# Patient Record
Sex: Female | Born: 1980
Health system: Southern US, Community
[De-identification: ages and names within clinical notes are randomized; demographics above are authoritative.]

## PROBLEM LIST (undated history)

## (undated) DIAGNOSIS — K219 Gastro-esophageal reflux disease without esophagitis: Secondary | ICD-10-CM

## (undated) DIAGNOSIS — D573 Sickle-cell trait: Secondary | ICD-10-CM

## (undated) DIAGNOSIS — K589 Irritable bowel syndrome without diarrhea: Secondary | ICD-10-CM

## (undated) DIAGNOSIS — R87619 Unspecified abnormal cytological findings in specimens from cervix uteri: Secondary | ICD-10-CM

## (undated) DIAGNOSIS — I10 Essential (primary) hypertension: Secondary | ICD-10-CM

## (undated) DIAGNOSIS — R42 Dizziness and giddiness: Secondary | ICD-10-CM

## (undated) HISTORY — DX: Sickle-cell trait: D57.3

## (undated) HISTORY — DX: Dizziness and giddiness: R42

## (undated) HISTORY — PX: COLPOSCOPY: SHX161

## (undated) HISTORY — DX: Unspecified abnormal cytological findings in specimens from cervix uteri: R87.619

---
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
## 2000-10-19 ENCOUNTER — Other Ambulatory Visit: Admission: RE | Admit: 2000-10-19 | Discharge: 2000-10-19 | Payer: Self-pay | Admitting: Obstetrics & Gynecology

## 2000-12-02 ENCOUNTER — Observation Stay (HOSPITAL_COMMUNITY): Admission: AD | Admit: 2000-12-02 | Discharge: 2000-12-03 | Payer: Self-pay | Admitting: Obstetrics and Gynecology

## 2000-12-02 ENCOUNTER — Encounter (INDEPENDENT_AMBULATORY_CARE_PROVIDER_SITE_OTHER): Payer: Self-pay

## 2001-05-02 ENCOUNTER — Emergency Department (HOSPITAL_COMMUNITY): Admission: EM | Admit: 2001-05-02 | Discharge: 2001-05-03 | Payer: Self-pay | Admitting: Emergency Medicine

## 2002-01-30 ENCOUNTER — Other Ambulatory Visit: Admission: RE | Admit: 2002-01-30 | Discharge: 2002-01-30 | Payer: Self-pay | Admitting: Obstetrics and Gynecology

## 2002-04-15 ENCOUNTER — Encounter (INDEPENDENT_AMBULATORY_CARE_PROVIDER_SITE_OTHER): Payer: Self-pay | Admitting: Specialist

## 2002-04-15 ENCOUNTER — Observation Stay (HOSPITAL_COMMUNITY): Admission: AD | Admit: 2002-04-15 | Discharge: 2002-04-16 | Payer: Self-pay | Admitting: Obstetrics and Gynecology

## 2004-01-04 ENCOUNTER — Emergency Department (HOSPITAL_COMMUNITY): Admission: EM | Admit: 2004-01-04 | Discharge: 2004-01-04 | Payer: Self-pay | Admitting: Emergency Medicine

## 2005-04-20 ENCOUNTER — Emergency Department (HOSPITAL_COMMUNITY): Admission: EM | Admit: 2005-04-20 | Discharge: 2005-04-20 | Payer: Self-pay | Admitting: Emergency Medicine

## 2005-08-07 ENCOUNTER — Ambulatory Visit: Payer: Self-pay | Admitting: Internal Medicine

## 2006-03-06 ENCOUNTER — Emergency Department (HOSPITAL_COMMUNITY): Admission: EM | Admit: 2006-03-06 | Discharge: 2006-03-06 | Payer: Self-pay | Admitting: Emergency Medicine

## 2007-08-13 ENCOUNTER — Emergency Department (HOSPITAL_COMMUNITY): Admission: EM | Admit: 2007-08-13 | Discharge: 2007-08-13 | Payer: Self-pay | Admitting: Emergency Medicine

## 2009-01-01 ENCOUNTER — Emergency Department (HOSPITAL_COMMUNITY): Admission: EM | Admit: 2009-01-01 | Discharge: 2009-01-01 | Payer: Self-pay | Admitting: Emergency Medicine

## 2009-03-22 ENCOUNTER — Inpatient Hospital Stay (HOSPITAL_COMMUNITY): Admission: AD | Admit: 2009-03-22 | Discharge: 2009-03-22 | Payer: Self-pay | Admitting: Obstetrics & Gynecology

## 2009-05-18 ENCOUNTER — Inpatient Hospital Stay (HOSPITAL_COMMUNITY): Admission: AD | Admit: 2009-05-18 | Discharge: 2009-05-18 | Payer: Self-pay | Admitting: Obstetrics & Gynecology

## 2009-05-18 ENCOUNTER — Ambulatory Visit: Payer: Self-pay | Admitting: Physician Assistant

## 2009-06-03 ENCOUNTER — Ambulatory Visit: Payer: Self-pay | Admitting: Obstetrics & Gynecology

## 2009-06-08 ENCOUNTER — Inpatient Hospital Stay (HOSPITAL_COMMUNITY): Admission: AD | Admit: 2009-06-08 | Discharge: 2009-06-08 | Payer: Self-pay | Admitting: Obstetrics & Gynecology

## 2009-06-12 ENCOUNTER — Inpatient Hospital Stay (HOSPITAL_COMMUNITY): Admission: AD | Admit: 2009-06-12 | Discharge: 2009-06-12 | Payer: Self-pay | Admitting: Obstetrics & Gynecology

## 2009-06-16 ENCOUNTER — Inpatient Hospital Stay (HOSPITAL_COMMUNITY): Admission: AD | Admit: 2009-06-16 | Discharge: 2009-06-16 | Payer: Self-pay | Admitting: Obstetrics & Gynecology

## 2009-06-24 ENCOUNTER — Ambulatory Visit: Payer: Self-pay | Admitting: Obstetrics & Gynecology

## 2009-06-28 ENCOUNTER — Ambulatory Visit: Payer: Self-pay | Admitting: Obstetrics & Gynecology

## 2009-06-28 ENCOUNTER — Ambulatory Visit (HOSPITAL_COMMUNITY): Admission: RE | Admit: 2009-06-28 | Discharge: 2009-06-28 | Payer: Self-pay | Admitting: Obstetrics & Gynecology

## 2009-06-29 ENCOUNTER — Inpatient Hospital Stay (HOSPITAL_COMMUNITY): Admission: AD | Admit: 2009-06-29 | Discharge: 2009-06-29 | Payer: Self-pay | Admitting: Obstetrics & Gynecology

## 2009-07-06 ENCOUNTER — Ambulatory Visit (HOSPITAL_COMMUNITY): Admission: RE | Admit: 2009-07-06 | Discharge: 2009-07-06 | Payer: Self-pay | Admitting: Obstetrics & Gynecology

## 2009-07-08 ENCOUNTER — Ambulatory Visit: Payer: Self-pay | Admitting: Obstetrics & Gynecology

## 2009-07-17 ENCOUNTER — Inpatient Hospital Stay (HOSPITAL_COMMUNITY): Admission: AD | Admit: 2009-07-17 | Discharge: 2009-07-17 | Payer: Self-pay | Admitting: Obstetrics & Gynecology

## 2009-07-19 ENCOUNTER — Ambulatory Visit: Payer: Self-pay | Admitting: Obstetrics & Gynecology

## 2009-07-19 ENCOUNTER — Inpatient Hospital Stay (HOSPITAL_COMMUNITY): Admission: AD | Admit: 2009-07-19 | Discharge: 2009-07-22 | Payer: Self-pay | Admitting: Obstetrics and Gynecology

## 2009-07-21 ENCOUNTER — Encounter: Payer: Self-pay | Admitting: Obstetrics and Gynecology

## 2009-07-25 ENCOUNTER — Inpatient Hospital Stay (HOSPITAL_COMMUNITY): Admission: AD | Admit: 2009-07-25 | Discharge: 2009-07-25 | Payer: Self-pay | Admitting: Obstetrics & Gynecology

## 2009-07-29 ENCOUNTER — Ambulatory Visit (HOSPITAL_COMMUNITY): Admission: RE | Admit: 2009-07-29 | Discharge: 2009-07-29 | Payer: Self-pay | Admitting: Obstetrics & Gynecology

## 2009-07-29 ENCOUNTER — Ambulatory Visit: Payer: Self-pay | Admitting: Obstetrics & Gynecology

## 2009-08-04 ENCOUNTER — Ambulatory Visit (HOSPITAL_COMMUNITY): Admission: RE | Admit: 2009-08-04 | Discharge: 2009-08-04 | Payer: Self-pay | Admitting: Obstetrics & Gynecology

## 2009-08-05 ENCOUNTER — Ambulatory Visit: Payer: Self-pay | Admitting: Obstetrics & Gynecology

## 2009-08-12 ENCOUNTER — Ambulatory Visit (HOSPITAL_COMMUNITY): Admission: RE | Admit: 2009-08-12 | Discharge: 2009-08-12 | Payer: Self-pay | Admitting: Obstetrics & Gynecology

## 2009-08-12 ENCOUNTER — Ambulatory Visit: Payer: Self-pay | Admitting: Obstetrics and Gynecology

## 2009-08-13 ENCOUNTER — Encounter: Payer: Self-pay | Admitting: Obstetrics & Gynecology

## 2009-08-13 ENCOUNTER — Inpatient Hospital Stay (HOSPITAL_COMMUNITY): Admission: AD | Admit: 2009-08-13 | Discharge: 2009-08-13 | Payer: Self-pay | Admitting: Obstetrics & Gynecology

## 2009-08-18 ENCOUNTER — Inpatient Hospital Stay (HOSPITAL_COMMUNITY): Admission: AD | Admit: 2009-08-18 | Discharge: 2009-08-18 | Payer: Self-pay | Admitting: Family Medicine

## 2009-08-19 ENCOUNTER — Ambulatory Visit (HOSPITAL_COMMUNITY): Admission: RE | Admit: 2009-08-19 | Discharge: 2009-08-19 | Payer: Self-pay | Admitting: Obstetrics & Gynecology

## 2009-08-19 ENCOUNTER — Ambulatory Visit: Payer: Self-pay | Admitting: Obstetrics & Gynecology

## 2009-08-24 ENCOUNTER — Ambulatory Visit: Payer: Self-pay | Admitting: Family

## 2009-08-24 ENCOUNTER — Inpatient Hospital Stay (HOSPITAL_COMMUNITY): Admission: AD | Admit: 2009-08-24 | Discharge: 2009-08-26 | Payer: Self-pay | Admitting: Obstetrics & Gynecology

## 2009-09-17 ENCOUNTER — Ambulatory Visit: Payer: Self-pay | Admitting: Obstetrics & Gynecology

## 2010-04-14 IMAGING — US US OB COMP LESS 14 WK
1 series · 14 of 23 positions shown · non-contrast
Comparison: 05/18/2009

CLINICAL DATA: Early pregnancy.  Abnormal uterine bleeding.

OBSTETRIC <14 WK ULTRASOUND
TECHNIQUE: Transabdominal ultrasound was performed for evaluation
of the gestation as well as the maternal uterus and adnexal
regions.

[Series 1: us ob comp less 14 wks · 0.17mm/px · 14 of 23 slices shown]
[im 1/23]
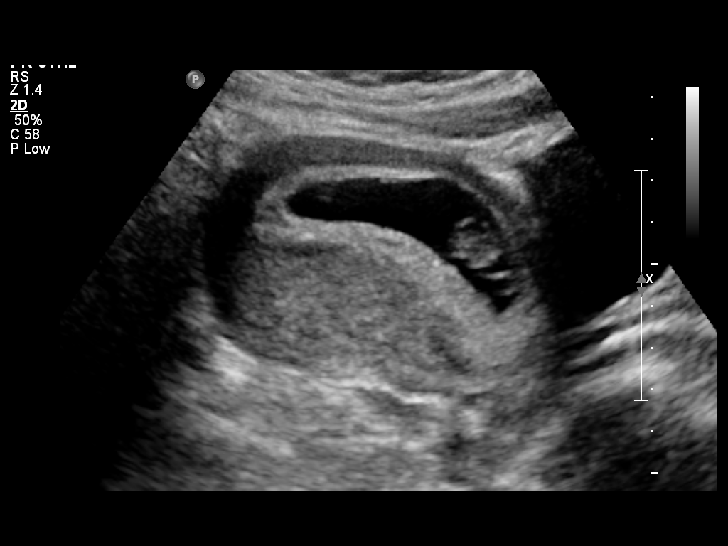
[im 3/23]
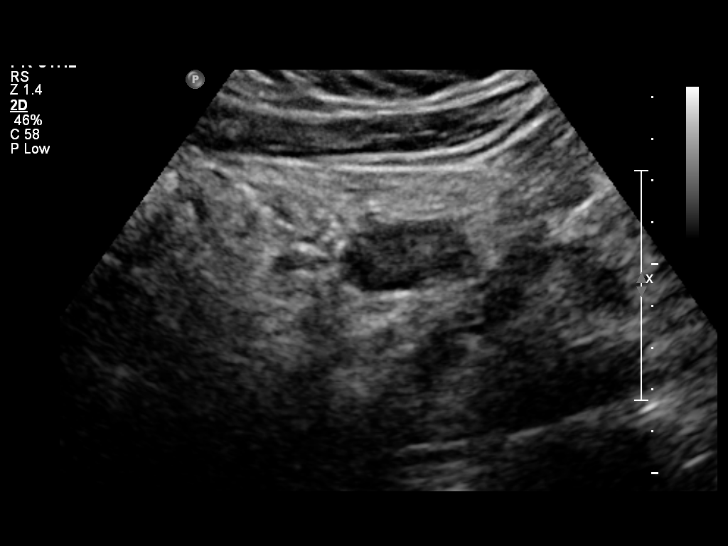
[im 5/23]
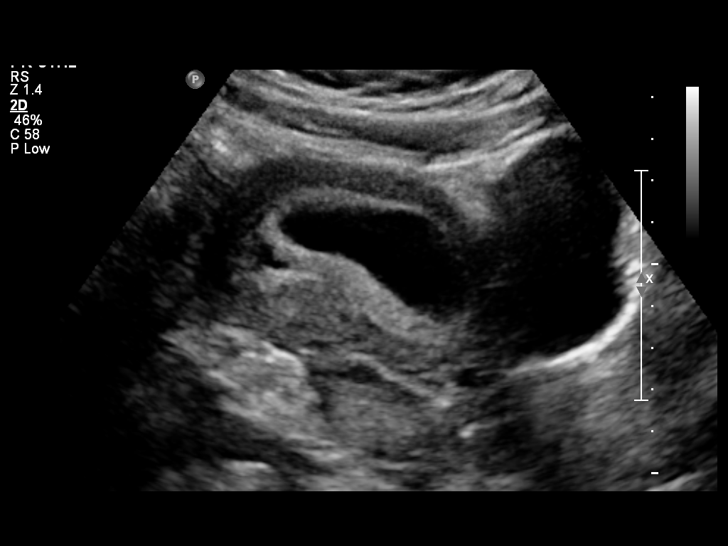
[im 6/23]
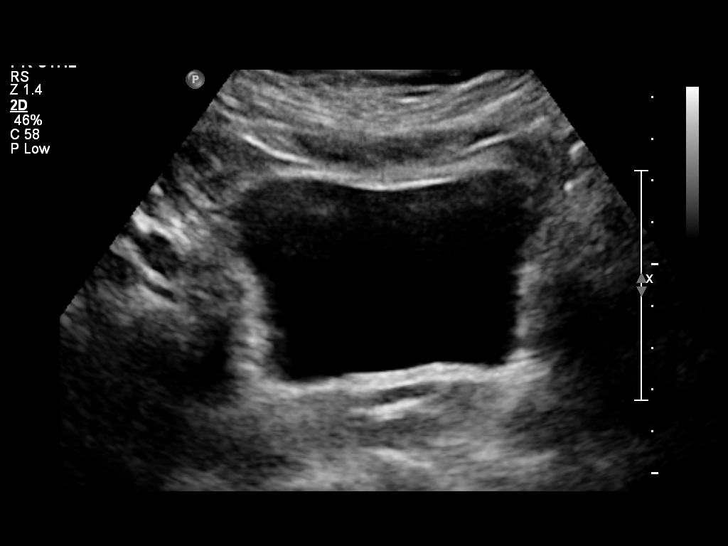
[im 8/23]
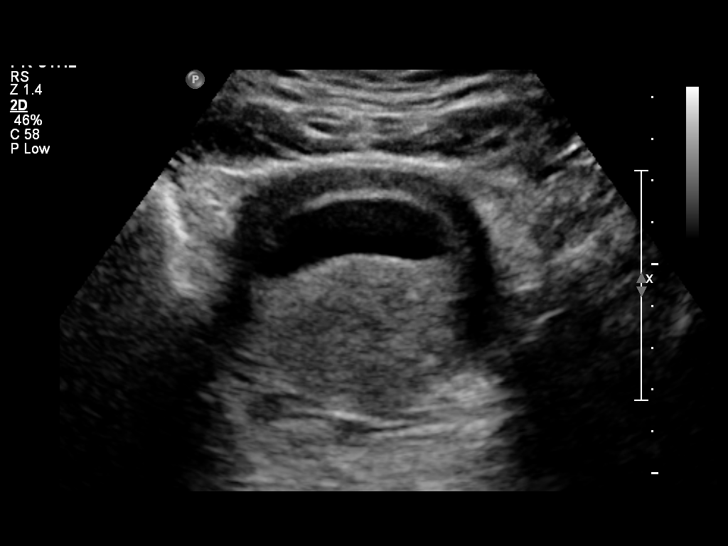
[im 10/23]
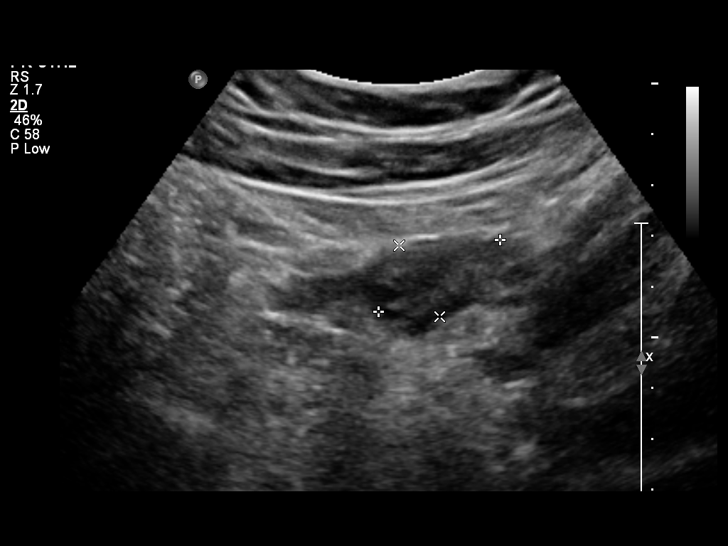
[im 11/23]
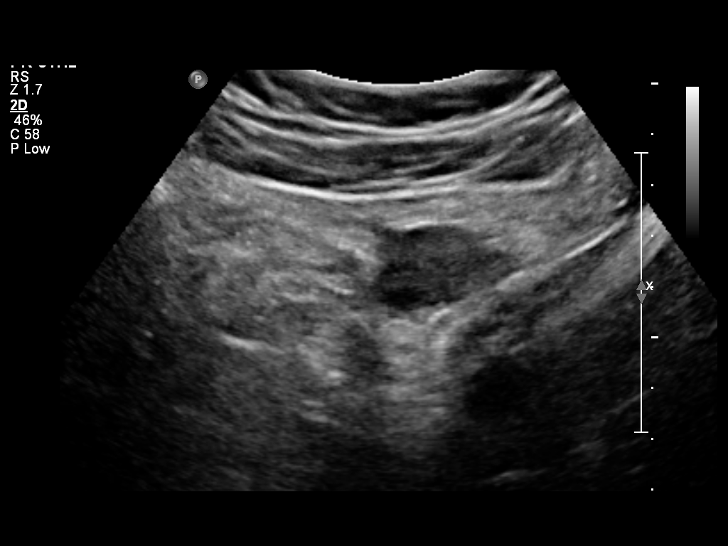
[im 13/23]
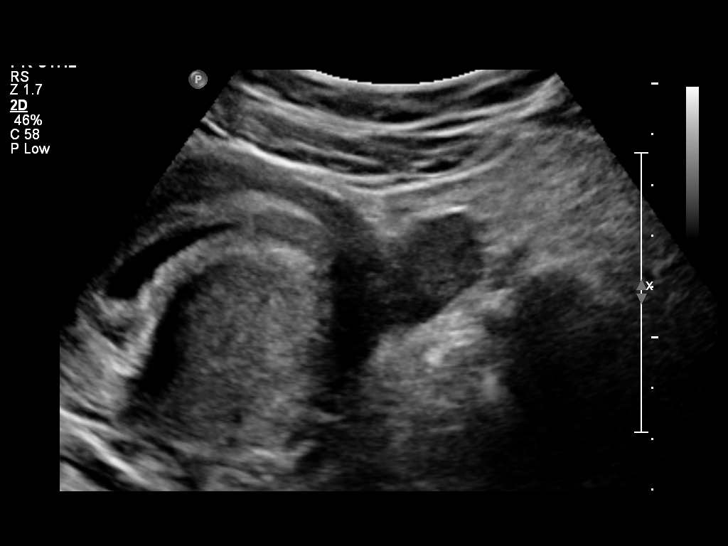
[im 14/23]
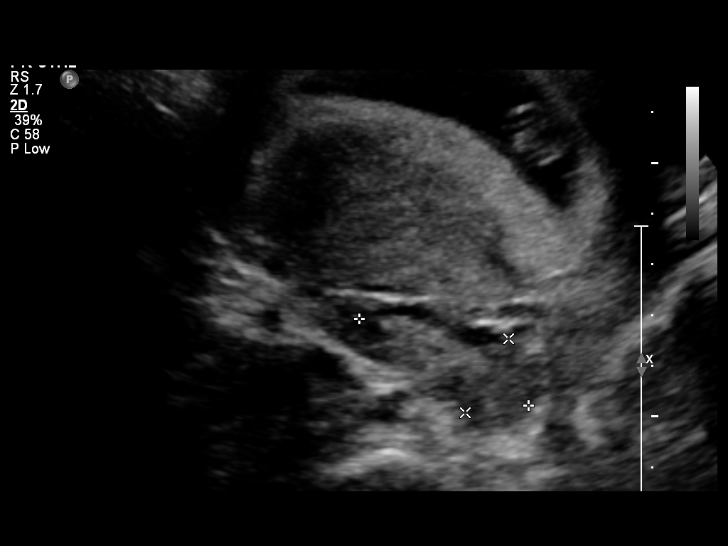
[im 16/23]
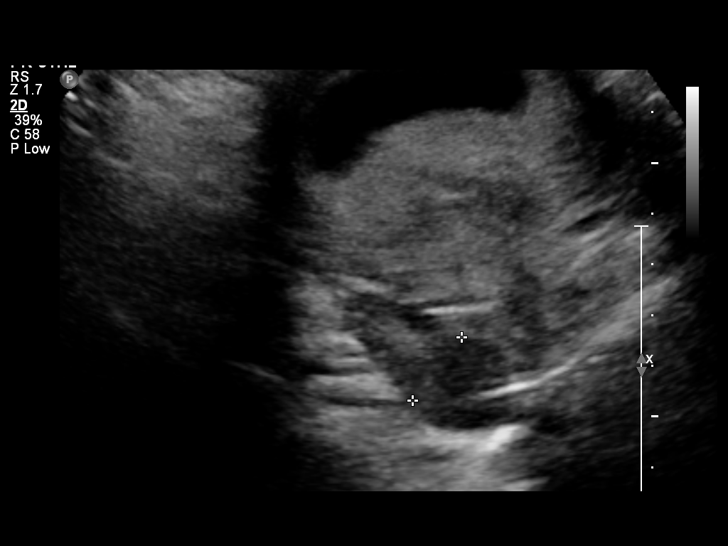
[im 18/23]
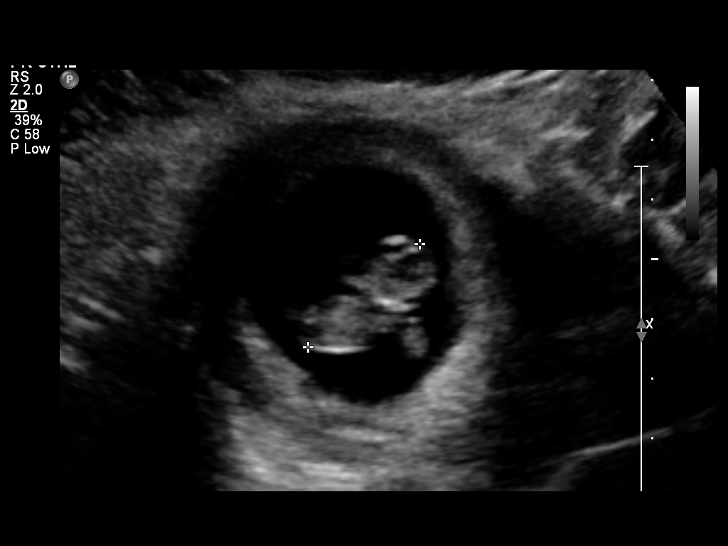
[im 19/23]
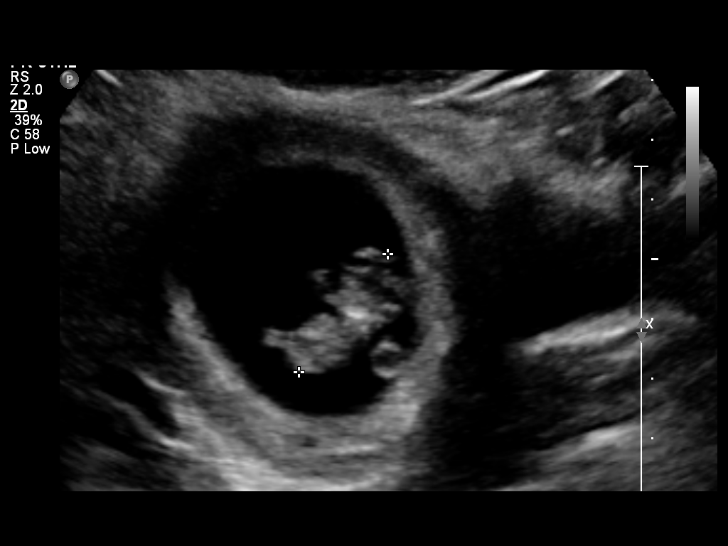
[im 21/23]
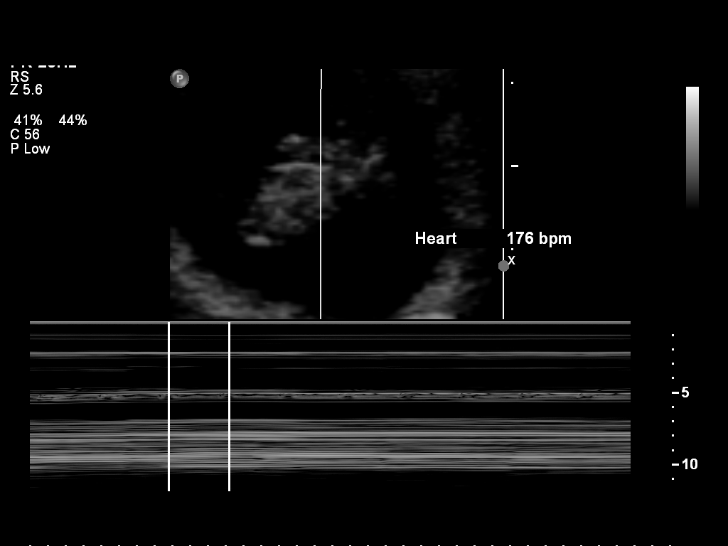
[im 23/23]
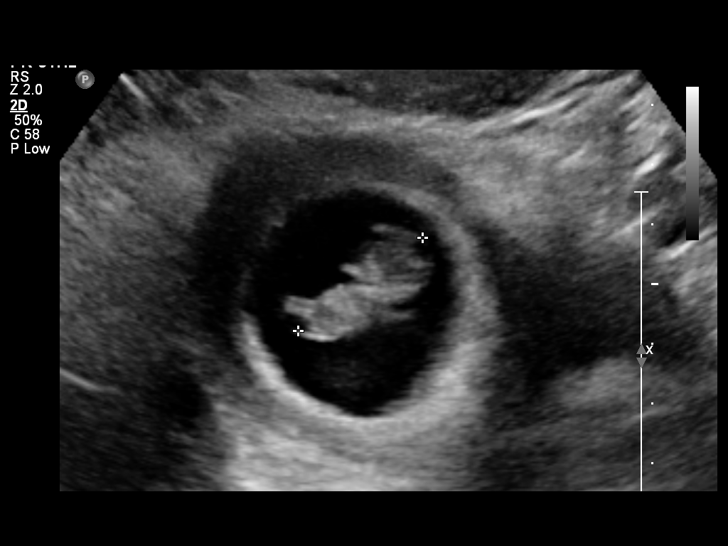

[14 of 23 positions shown; findings below may reference images not displayed]

Intrauterine gestational sac: Single
Yolk sac: Yes
Embryo: Yes
Cardiac Activity: Yes
Heart Rate: 176 bpm

CRL:  25.3 mm         9w  3d        US EDC: 01/08/2010

Maternal uterus/Adnexae:
There is no subchorionic hemorrhage.  The ovaries are normal.  No
free fluid.
IMPRESSION: Normal appearing single intrauterine pregnancy of approximately 9
weeks 3 days gestation, showing normal growth in the interval since
the prior study dated 05/18/2009.

## 2010-06-10 IMAGING — US US OB TRANSVAGINAL
1 series · 11 of 11 positions shown · non-contrast
Comparison: none

OBSTETRICAL ULTRASOUND:
 This ultrasound was performed in The [HOSPITAL], and the [facility] report will be stored to [REDACTED] PACS.

[Series 1: us ob transvaginal · 11 acquisitions, 11 frames shown]
[im 1/11]
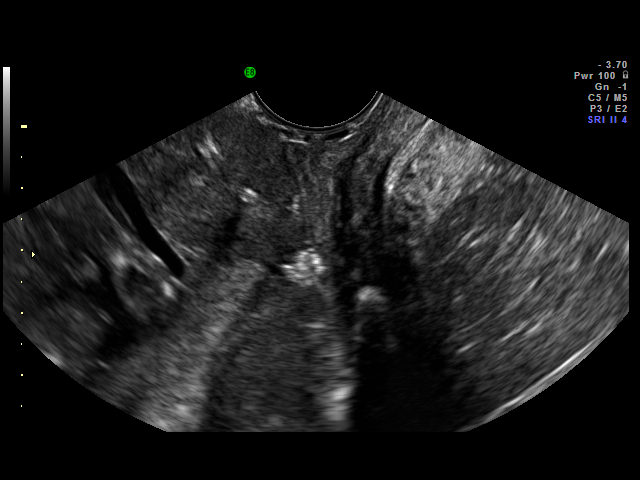
[im 2/11]
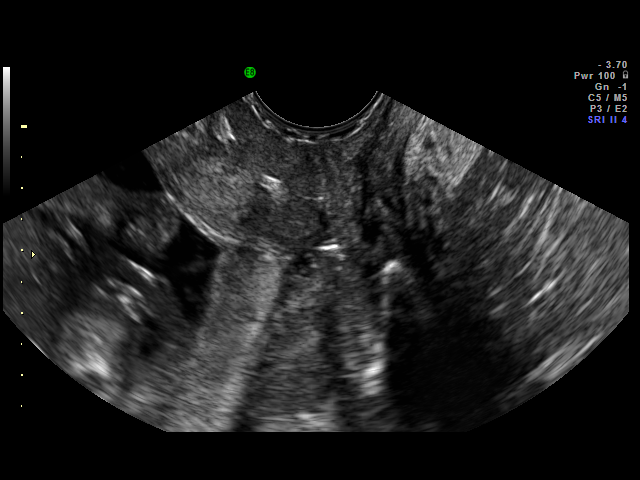
[im 3/11]
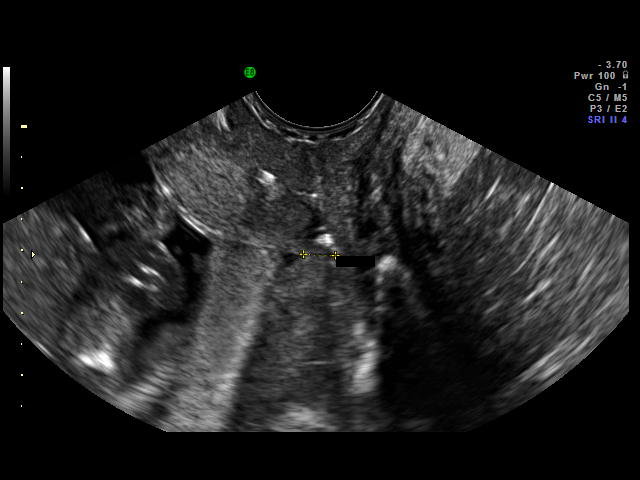
[im 4/11]
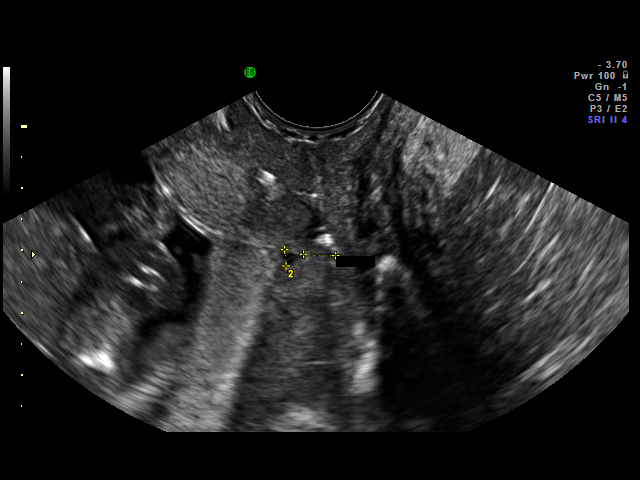
[im 5/11]
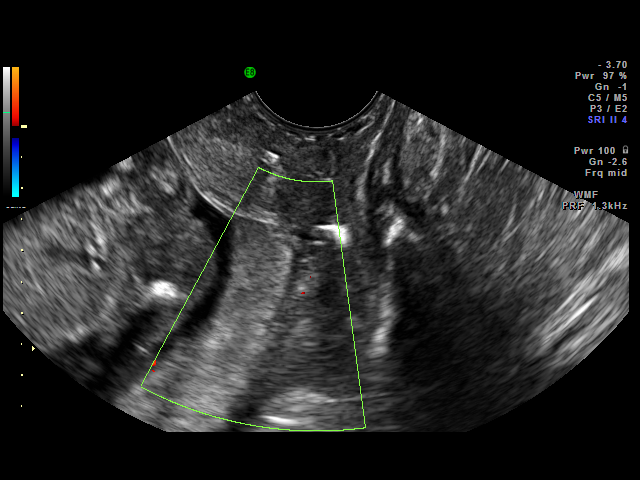
[im 6/11]
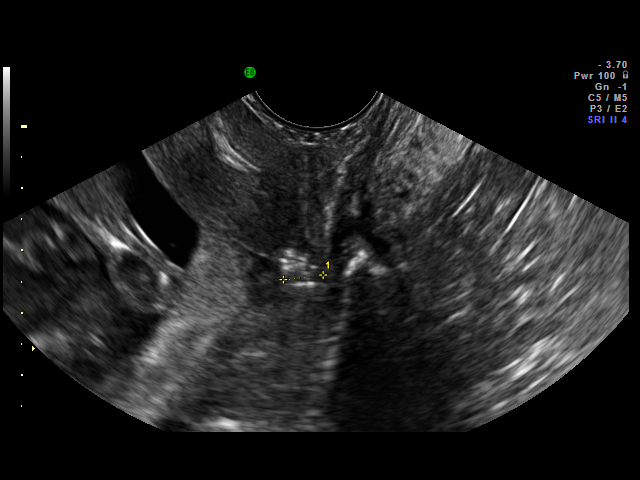
[im 7/11]
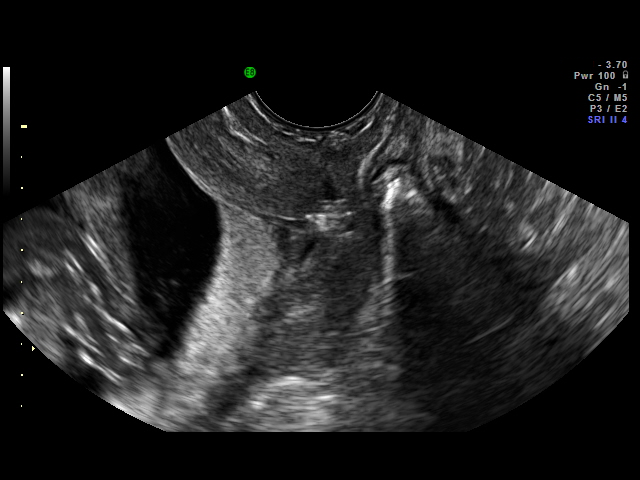
[im 8/11]
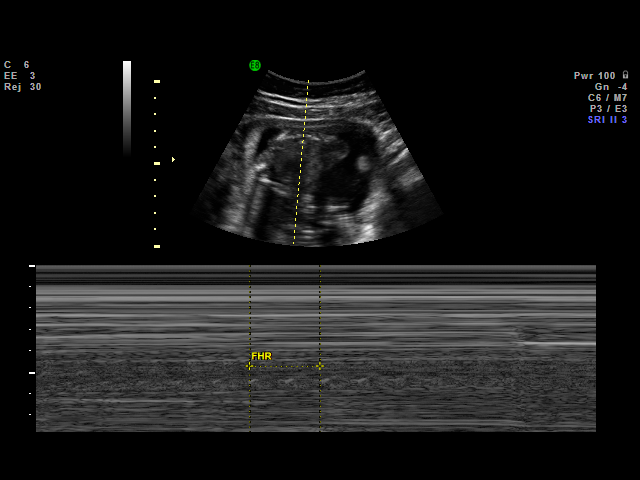
[im 9/11]
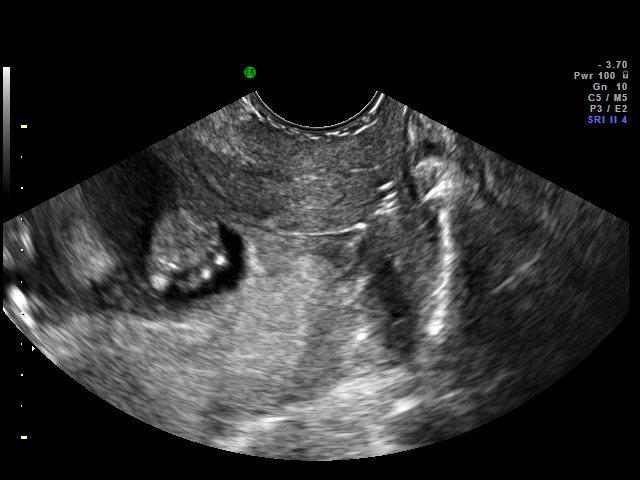
[im 10/11]
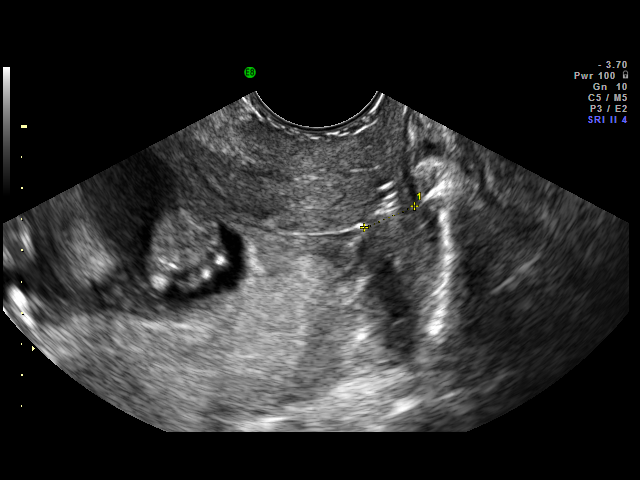
[im 11/11]
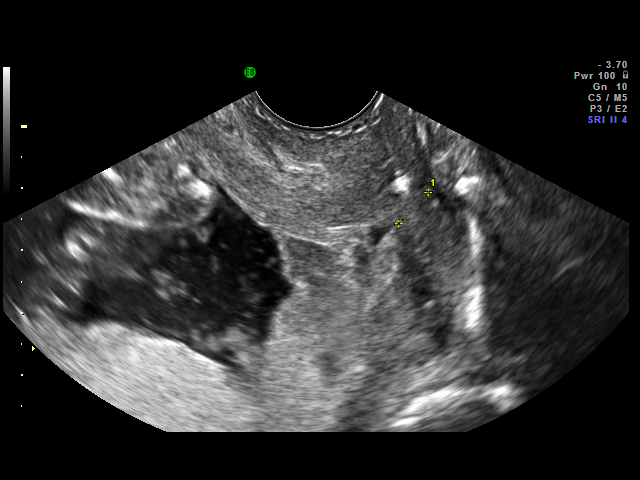

[11 of 11 positions shown; findings below may reference images not displayed]

IMPRESSION: [facility] has also been faxed to the ordering physician.

## 2011-01-02 ENCOUNTER — Encounter: Payer: Self-pay | Admitting: *Deleted

## 2011-02-11 ENCOUNTER — Inpatient Hospital Stay (HOSPITAL_COMMUNITY)
Admission: AD | Admit: 2011-02-11 | Discharge: 2011-02-11 | Disposition: A | Discharge status: Home / Self Care | Payer: 59 | Source: Ambulatory Visit | Attending: Family Medicine | Admitting: Family Medicine

## 2011-02-11 DIAGNOSIS — R300 Dysuria: Secondary | ICD-10-CM

## 2011-02-11 DIAGNOSIS — N390 Urinary tract infection, site not specified: Secondary | ICD-10-CM

## 2011-02-11 LAB — URINALYSIS, ROUTINE W REFLEX MICROSCOPIC
Bilirubin Urine: NEGATIVE
Ketones, ur: NEGATIVE mg/dL
Nitrite: POSITIVE — AB
Protein, ur: NEGATIVE mg/dL
Specific Gravity, Urine: >1.03 — ABNORMAL HIGH (ref 1.005–1.030)

## 2011-02-11 LAB — URINE MICROSCOPIC-ADD ON

## 2011-03-17 LAB — CBC
HCT: 36.7 % (ref 36.0–46.0)
HCT: 39.6 % (ref 36.0–46.0)
Hemoglobin: 12.6 g/dL (ref 12.0–15.0)
Hemoglobin: 13.3 g/dL (ref 12.0–15.0)
MCHC: 33.7 g/dL (ref 30.0–36.0)
MCV: 85.6 fL (ref 78.0–100.0)
RBC: 4.32 MIL/uL (ref 3.87–5.11)
RBC: 4.63 MIL/uL (ref 3.87–5.11)
RDW: 12.7 % (ref 11.5–15.5)
WBC: 14.6 10*3/uL — ABNORMAL HIGH (ref 4.0–10.5)

## 2011-03-17 LAB — CROSSMATCH

## 2011-03-17 LAB — POCT URINALYSIS DIP (DEVICE)
Ketones, ur: NEGATIVE mg/dL
Protein, ur: NEGATIVE mg/dL
pH: 7 (ref 5.0–8.0)

## 2011-03-17 LAB — WET PREP, GENITAL

## 2011-03-18 LAB — URINALYSIS, ROUTINE W REFLEX MICROSCOPIC
Leukocytes, UA: NEGATIVE
Nitrite: NEGATIVE
Specific Gravity, Urine: 1.01 (ref 1.005–1.030)
pH: 7 (ref 5.0–8.0)

## 2011-03-18 LAB — CBC
HCT: 37.9 % (ref 36.0–46.0)
MCHC: 34.1 g/dL (ref 30.0–36.0)
Platelets: 267 10*3/uL (ref 150–400)
Platelets: 274 10*3/uL (ref 150–400)
RBC: 4.36 MIL/uL (ref 3.87–5.11)
RDW: 13.1 % (ref 11.5–15.5)
WBC: 12.6 10*3/uL — ABNORMAL HIGH (ref 4.0–10.5)

## 2011-03-18 LAB — ABO/RH: ABO/RH(D): O POS

## 2011-03-18 LAB — POCT URINALYSIS DIP (DEVICE)
Glucose, UA: NEGATIVE mg/dL
Nitrite: NEGATIVE
Protein, ur: NEGATIVE mg/dL
Protein, ur: NEGATIVE mg/dL
Specific Gravity, Urine: 1.015 (ref 1.005–1.030)
Urobilinogen, UA: 1 mg/dL (ref 0.0–1.0)
Urobilinogen, UA: 2 mg/dL — ABNORMAL HIGH (ref 0.0–1.0)

## 2011-03-18 LAB — URINE MICROSCOPIC-ADD ON

## 2011-03-19 LAB — POCT URINALYSIS DIP (DEVICE)
Bilirubin Urine: NEGATIVE
Bilirubin Urine: NEGATIVE
Glucose, UA: NEGATIVE mg/dL
Glucose, UA: NEGATIVE mg/dL
Hgb urine dipstick: NEGATIVE
Nitrite: NEGATIVE
Nitrite: NEGATIVE
Specific Gravity, Urine: 1.02 (ref 1.005–1.030)
Urobilinogen, UA: 1 mg/dL (ref 0.0–1.0)
Urobilinogen, UA: 2 mg/dL — ABNORMAL HIGH (ref 0.0–1.0)
pH: 6.5 (ref 5.0–8.0)

## 2011-03-19 LAB — CBC
MCHC: 34.9 g/dL (ref 30.0–36.0)
MCV: 83.6 fL (ref 78.0–100.0)
Platelets: 286 10*3/uL (ref 150–400)
RDW: 13.2 % (ref 11.5–15.5)

## 2011-03-19 LAB — WET PREP, GENITAL: Trich, Wet Prep: NONE SEEN

## 2011-03-19 LAB — URINALYSIS, ROUTINE W REFLEX MICROSCOPIC
Nitrite: NEGATIVE
Protein, ur: NEGATIVE mg/dL
Specific Gravity, Urine: 1.01 (ref 1.005–1.030)
Urobilinogen, UA: 1 mg/dL (ref 0.0–1.0)

## 2011-03-20 LAB — WET PREP, GENITAL
Trich, Wet Prep: NONE SEEN
Yeast Wet Prep HPF POC: NONE SEEN

## 2011-03-20 LAB — URINALYSIS, ROUTINE W REFLEX MICROSCOPIC
Bilirubin Urine: NEGATIVE
Bilirubin Urine: NEGATIVE
Glucose, UA: 100 mg/dL — AB
Ketones, ur: 15 mg/dL — AB
Nitrite: NEGATIVE
Protein, ur: NEGATIVE mg/dL
Specific Gravity, Urine: 1.025 (ref 1.005–1.030)
Urobilinogen, UA: 0.2 mg/dL (ref 0.0–1.0)
Urobilinogen, UA: 0.2 mg/dL (ref 0.0–1.0)
pH: 5.5 (ref 5.0–8.0)

## 2011-03-20 LAB — CBC
HCT: 41.7 % (ref 36.0–46.0)
MCHC: 34.6 g/dL (ref 30.0–36.0)
MCV: 85.1 fL (ref 78.0–100.0)
Platelets: 281 10*3/uL (ref 150–400)
RDW: 13.7 % (ref 11.5–15.5)

## 2011-03-20 LAB — URINE MICROSCOPIC-ADD ON

## 2011-03-20 LAB — HCG, QUANTITATIVE, PREGNANCY: hCG, Beta Chain, Quant, S: 55087 m[IU]/mL — ABNORMAL HIGH (ref <5)

## 2011-03-20 LAB — GC/CHLAMYDIA PROBE AMP, URINE: Chlamydia, Swab/Urine, PCR: NEGATIVE

## 2011-03-22 LAB — URINE CULTURE

## 2011-03-22 LAB — WET PREP, GENITAL

## 2011-03-22 LAB — URINALYSIS, ROUTINE W REFLEX MICROSCOPIC
Nitrite: NEGATIVE
Protein, ur: NEGATIVE mg/dL
Urobilinogen, UA: 0.2 mg/dL (ref 0.0–1.0)

## 2011-03-22 LAB — URINE MICROSCOPIC-ADD ON

## 2011-03-22 LAB — GC/CHLAMYDIA PROBE AMP, GENITAL: Chlamydia, DNA Probe: NEGATIVE

## 2011-03-23 LAB — POCT PREGNANCY, URINE: Preg Test, Ur: NEGATIVE

## 2011-04-25 NOTE — Discharge Summary (Signed)
NAMEJENNILEE, [name]         ACCOUNT NO.:  [id number]   MEDICAL RECORD NO.:  [id number]          PATIENT TYPE:  INP   LOCATION:  9320                          FACILITY:  [facility]   PHYSICIAN:  Horton Chin, MD DATE OF BIRTH:  27-Aug-1981   DATE OF ADMISSION:  07/18/2009  DATE OF DISCHARGE:  07/22/2009                               DISCHARGE SUMMARY   ADMISSION DIAGNOSES:  1. Intrauterine pregnancy at 28 and 4/7th weeks' gestation.  2. Bleeding per vagina.  3. Posterior placenta previa.  4. Cervical length 1.07 cm which is indicative of cervical      insufficiency.  The patient is status post prophylactic cerclage.   DISCHARGE DIAGNOSES:  1. Intrauterine pregnancy at 2 and 2 weeks' gestation.  2. Resolved vaginal bleeding.   PERTINENT LABORATORY DATA:  Admission hemoglobin is 12.8 which remained  stable.   CONSULTS:  Maternal fetal medicine, Dr. Rica Koyanagi.   BRIEF HOSPITAL COURSE:  The patient is a 30 year old, gravida 3, para 0-  1-1-0, who was admitted at 77 and 5/7th weeks' with vaginal bleeding.  The patient does have a history of 2 second trimester losses at 53 and  22 weeks, and had a prophylactic transvaginal cerclage procedure done  around 12 weeks' gestation.  The patient was admitted in the setting of  a significant amount of vaginal bleeding, and ultrasound that was done  on admission showed a posterior placenta previa and a clot that was  noted to distend the lower uterine segment causing funneling with a  cervical length of 1.07 cm.  The cervical canal was noted to be dilated  to about 4-mm with a cervical suture seen within the residual cervix,  however, it was displaced posteriorly.  The patient was admitted for  observation.  During the time of observation, the patient had some  bleeding initially, but tapered off to just brown discharge.  By the  time of discharge, she had not had any vaginal bleeding for over 48  hours and has stable vital signs and  no other concerns.  The patient was  seen by the maternal fetal medicine consult and the plan was made for a  close follow up after given that the patient is stable.  They  recommended against replacement of the cerclage given increased risk of  bleeding and infection.  The decision was made to discharge to home on  bedrest.  The patient was given a note for work that will keep her out  of work for now.  She does understand that she is at increased risk of  further bleeding and infection in this setting and her prognosis at this  point is uncertain.   DISCHARGE MEDICATIONS:  1. Percocet 5/325 mg 1-2 tablets p.o. q.6 h p.r.n. pain.  2. Colace 100 mg p.o. t.i.d. p.r.n. constipation.  3. The patient is to continue her prenatal vitamins at home.   DISCHARGE INSTRUCTIONS:  The patient was told to be on strict bedrest  and to also be on pelvic rest.  She was told to come back for any  further bleeding or any other concerns.  The patient has  followup  [facility] July [facility], 2009 at 10:45 a.m. and she has another  appointment with Maternal Fetal Medicine for a followup ultrasound on  the same day July 29, 2009 at 1:00 p.m.  These appointments were  communicated to the patient.      Horton Chin, MD  Electronically Signed     UAA/MEDQ  D:  07/22/2009  T:  07/22/2009  Job:  [id number]

## 2011-04-25 NOTE — Op Note (Signed)
[name], [name]         ACCOUNT NO.:  [id number]   MEDICAL RECORD NO.:  [id number]          PATIENT TYPE:  AMB   LOCATION:  SDC                           [facility]ILITY:  [facility]   PHYSICIAN:  Horton Chin, MD DATE OF BIRTH:  1981-02-26   DATE OF PROCEDURE:  06/28/2009  DATE OF DISCHARGE:                               OPERATIVE REPORT   PREOPERATIVE DIAGNOSIS:  Cervical incompetence.   POSTOPERATIVE DIAGNOSIS:  Cervical incompetence.   PROCEDURE:  McDonald cerclage placement.   SURGEON:  Horton Chin, MD   ANESTHESIOLOGIST:  Raul Del, MD   ANESTHESIA:  Spinal.   INDICATIONS:  The patient is a 30 year old, gravida 3, para 0-1-1-0 with  a history of 2 second trimester losses, who was counseled regarding  prophylactic cervical cerclage.  Prior to surgery, the risks of the  cerclage placement including, but not limited to bleeding, infection,  injury to surrounding organs, premature rupture of membranes, need for  additional procedures were discussed with the patient and written  informed consent was obtained.  The patient had a positive fetal heart  rate that was reassuring and both prior to and after the procedure.   FINDINGS:  A 2 cm of cervical length within the vagina was able to be  palpated.  In the beginning of the case, external os was palpated to be  fingertip and this was effectively closed at the end of cerclage  placement.   SPECIMENS:  None.   IV FLUIDS:  500 mL lactated Ringer's.   ESTIMATED BLOOD LOSS:  Minimal.   COMPLICATIONS:  None immediate.   PROCEDURE DETAILS:  The patient had sequential compression devices  applied to her lower extremities while in the preoperative area and she  was taken to the operating room, where spinal analgesia was administered  and found to be adequate.  The patient was then placed in the dorsal  lithotomy position and prepped and draped in the sterile manner.  Bladder was straight catheterized for an  unmeasured amount of clear  yellow urine.  After a timeout was conducted, attention was then turned  to the patient's pelvis, where a vaginal speculum was placed and a ring  forceps was used to grab the anterior lip of the cervix.  A paracervical  block was administered using 20 mL of 0.25% Marcaine.  At this point  using a 5-mm Mersilene string, the cerclage was placed starting  anteriorly and going in a pursestring fashion with care given to avoid  the vessels at 9 o'clock and at 3 o'clock positions on the cervix and  also ended anteriorly.  The 2 ends of the suture were tied together  anteriorly and excellent closure of the cervix was noted.  The patient  had minimal blood loss at the end of the case.  She tolerated the  procedure well.  Sponge,  instrument, and needle counts were correct x2.  All instruments were  removed from the patient's pelvis.  She was taken to the recovery room  in stable condition.  The patient will follow up in the [facility]  as scheduled and was given prescriptions for  Percocet and Colace as  needed.      Horton Chin, MD  Electronically Signed     UAA/MEDQ  D:  06/28/2009  T:  06/28/2009  Job:  [id number]

## 2011-04-28 NOTE — Op Note (Signed)
[facility]  Patient:    [name], [name]                  MRN: [id number] Proc. Date: 12/03/00 Adm. Date:  [id number] Attending:  Dierdre Forth Pearline                           Operative Report  PREOPERATIVE DIAGNOSIS:       Status post spontaneous abortion with retained placenta.  POSTOPERATIVE DIAGNOSIS:      Status post spontaneous abortion with retained placenta.  OPERATION:                    Dilatation and evacuation.  ANESTHESIA:                   Spinal.  ESTIMATED BLOOD LOSS:         Less than 100 cc.  COMPLICATIONS:                None.  FINDINGS:                     The patient had delivered a still born 16-week fetus approximately four hours prior and had failed to deliver the placenta. At D&E, the placenta was removed primarily intact with a moderate amount of fragments of retained placenta obtained at curettage.  DESCRIPTION OF PROCEDURE:  The patient was taken to the operating room after appropriate identification and placed on the operating table.  After placement of a spinal anesthetic, she was placed in the lithotomy position.  The perineum and vagina were prepped with multiple layers of Betadine. The perineum was draped as a sterile field.  A red Robinson catheter was used to empty the bladder.  A Graves speculum was placed in the vagina and the placenta walked out of the uterus and cervix, then removed from the operative field.  A large curet was then used to curet all quadrants of the uterus and a moderate amount of retained products of conception removed.  Once all products of conception had been removed, the patient was noted to have adequate hemostasis and was taken from the operating room to the recovery room in satisfactory condition, having tolerated the procedure well with all instruments removed from the vagina and counted and sponges correct. Specimens to pathology.  Placenta and fragments of products of  conception. DD:  12/03/00 TD:  12/03/00 Job: [id number]

## 2011-04-28 NOTE — H&P (Signed)
[facility]  Patient:    [name], [name] Visit Number: [id number] MRN: [id number]          Service Type: OBS Location: 9300 [id number] Attending Physician:  Jaymes Graff A Dictated by:   Saverio Danker, C.N.M. Admit Date:  04/15/2002 Discharge Date: 04/16/2002                           History and Physical  HISTORY OF PRESENT ILLNESS:   Ms. Capili is a 30 year old single black female, gravida 2, para 0-0-1-0, at 22-3/7ths weeks by LMP, confirmed by early ultrasound, who presents complaining of sudden onset of uterine cramps around 10 p.m.  She is very uncomfortable with these pains and is requesting something for pain.  She denies any leaking or bleeding.  She denies any drug use recently.  She denies any nausea, vomiting, headaches or visual disturbances.  She is very uncomfortable as previously mentioned.  Her pregnancy has been followed at [facility] by the M.D. service and has been at risk for a history of previous 16-week IUSD in December of 2001.  OBSTETRICAL/GYNECOLOGICAL HISTORY:                      She is a gravida 2, para 0-0-1-0 who delivered a stillbirth at 16-week gestation in December of 2001.  Her last menstrual period for this pregnancy was on November 07, 2001, giving her an [facility] of August 14, 2002.  She reports a history of Depo-Provera use with her last one in January of 2002.  Her other GYN history was a D&E for retained placenta following her pregnancy loss.  She reports a history of Chlamydia with that first pregnancy also.  GENERAL MEDICAL HISTORY:      She has no known drug allergies.  She reports having had the usual childhood diseases.  She has no other medical problems. Her only surgery was wisdom teeth removed and a D&E for retained placenta.  FAMILY HISTORY:               Significant for maternal grandfather with MI, first cousin with a kidney transplant.  GENETIC HISTORY:               Negative.  SOCIAL HISTORY:               She is single.  She lives with her family, her mom.  The father of the baby is Lorin Mercy and is supportive.  She is currently unemployed.  PRENATAL LABORATORIES:        Her prenatal labs are not currently available.  PHYSICAL EXAMINATION:  VITAL SIGNS:                  Her vital signs are stable, she is afebrile.  HEENT:                        Grossly within normal limits.  HEART:                        Regular rhythm and rate.  CHEST:                        Clear.  BREASTS:                      Soft and nontender.  ABDOMEN:  Her abdomen is with fundal height around her umbilicus.  Lower abdomen is tender with contractions, but nontender to palpation.  Her fetal heart rate is not audible with Doppler and is visible in the 70-80 range by portable bedside ultrasound.  PELVIC:                       A sterile speculum exam reveals bulging membranes and her cervix is completely dilated at 100% and at a 0 station.  EXTREMITIES:                  Within normal limits.  IMPRESSION: 1. Intrauterine pregnancy at 22-3/7ths weeks by last menstrual period. 2. Preterm dilation.  PLAN:                         To admit to labor and delivery, to anticipate an imminent delivery of a nonviable fetus.  GC, Chlamydia, beta strep were collected.  Subcu terbutaline has been given and Stadol IV has also been given.  The patient was offered tocolysis but declined at this time.  Dr. Normand Sloop has been notified of patients admission and plan has been made in consultation with her and she will follow patient. Dictated by:   Vance Gather Duplantis, C.N.M. Attending Physician:  Michael Litter DD:  04/16/02 TD:  04/16/02 Job: [id number]

## 2011-04-28 NOTE — H&P (Signed)
[facility]  Patient:    [name], [name]                  MRN: [id number] Adm. Date:  [id number] Attending:  Shaune Spittle Dictator:   Mack Guise, C.N.M.                         History and Physical  NO DICTATION DD:  12/02/00 TD:  12/02/00 Job: [id number]

## 2011-04-28 NOTE — H&P (Signed)
[facility]  Patient:    [name], [name]                  MRN: [id number] Adm. Date:  [id number] Attending:  Shaune Spittle Dictator:   Mack Guise, C.N.M.                         History and Physical  HISTORY OF PRESENT ILLNESS:   [name] is a 30 year old, gravida 1, para 0, at 16-3/7 weeks, EDD May 16, 2001, who presents with bleeding since about 3 oclock this afternoon, abdominal cramping and pain prior to the point that she started bleeding with the cramping and pain easing off since that time. She was previously seen at [facility] for her new OB visit with positive fetal heart tones.  She was treated following that visit for STD, probably Chlamydia.  PRENATAL COURSE:              Her pregnancy has been followed by [facility] and is remarkable for:                               1. Adolescence.                               2. Hypertension at age 30 x 1 year.  No                                  medications and no problems since then.                               3. First trimester STD (Chlamydia).  PAST MEDICAL HISTORY:         Hypertension at age 30, none since that time.  FAMILY HISTORY:               Maternal grandfather, MI.  Maternal first cousin, kidney transplant.  Maternal uncle, cancer, unknown type.  The patients mother and aunt are smokers.  PAST SURGICAL HISTORY:        Wisdom teeth.  OBSTETRICAL HISTORY:          Present pregnancy.  GENETIC HISTORY:              There is no family history of familial or genetic disorders, children that died in infancy or that were born with birth defects.  SOCIAL HISTORY:               [name] is a 30 year old African-American, single female.  She is in the 11th grade and father of the baby, Dollene Primrose is supportive.  He is also in the 11th grade.  They are of the [employer].  The patients mother is present with her at this  time.  ALLERGIES:                    No known drug allergies.  HABITS:                       The patient denies the use of tobacco, alcohol or illicit drugs.  PHYSICAL EXAMINATION:  VITAL SIGNS:  Stable, afebrile.  Temperature 98.2, pulse 96, respirations 20, blood pressure lying 114/65, sitting 117/62, standing 122/71.  HEART:                        Regular rate and rhythm.  LUNGS:                        Clear.  ABDOMEN:                      Soft and nontender.  No fetal heart tones are heard.  PELVIC:                       A large amount of pinkish bleeding is noted and sterile speculum examination finds the vagina filled with clots and blood. These were evacuated and a fetal foot is seen in the vagina.  LABORATORY DATA:              Blood type 0 positive.  CBC:  WBC is 12.1, hemoglobin 12.8, hematocrit 35.2, platelets 269,000.  Catheterized urine is negative.  ASSESSMENT:                   Second trimester intrauterine fetal death. Incomplete abortion.  PLAN:                         Admit for induction of labor per Dr. Dierdre Forth.  Dr. Pennie Rushing to come in and discuss options for induction with the patient. DD:  12/02/00 TD:  12/03/00 Job: [id number]

## 2011-04-28 NOTE — Discharge Summary (Signed)
[facility]  Patient:    [name], [name] Visit Number: [id number] MRN: [id number]          Service Type: OBS Location: 9300 [id number] Attending Physician:  Jaymes Graff A Dictated by:   Nigel Bridgeman, C.N.M. Admit Date:  04/15/2002 Discharge Date: 04/16/2002                             Discharge Summary  ADMISSION DIAGNOSES:          1. Intrauterine fetal demise at 16 weeks.                               2. Incomplete spontaneous abortion, status post                                  rupture of membranes.                               3. History of chlamydia in the first trimester.  DISCHARGE DIAGNOSES:          1. Intrauterine fetal demise at 16 weeks.                               2. Incomplete spontaneous abortion, status post                                  rupture of membranes.                               3. History of chlamydia in the first trimester.                               4. Retained placenta status post spontaneous                                  abortion.  PROCEDURES:                   1. Cytotec induction.                               2. Dilatation and evacuation.                               3. Spinal anesthesia.  HISTORY OF PRESENT ILLNESS:   [name] is a 30 year old, gravida 1, para 0, at 16-3/[redacted] weeks gestation, who presented on December 02, 2000, with bleeding, cramping, and abdominal pain.  She had been seen at [facility] with positive fetal heart tones in the past.  She was treated during this pregnancy in the first trimester for chlamydia.  The pregnancy had been followed by [facility] and was remarkable for:  1. Adolescent. 2. Hypertension at age 9 x 1 year.  3. First trimester chlamydia.  On presentation, there were no fetal heart tones  noted.  There was a large amount of pinkish bleeding.  There were clots evacuated and a fetal foot seen in the vagina.  HOSPITAL COURSE:               Vanessa P. Haygood, M.D., was consulted and came in to see the patient.  Her temperature was 99 degrees.  The cervix was 2-3 cm.  There were fetal feet in the vagina.  Discussion was held with the patient regarding options.  The decision was made to proceed with induction via Cytotec.  ANA, LAC, and ACLA labs were to be done postpartum.  GC, chlamydia, and group B streptococcus were done at that time.  The patient then progressed to delivery of a stillborn female infant at 2130 hours.  The placenta was unable to be delivered with gentle traction and the patient was unable to tolerate a speculum exam.  A D&E was recommended for removal of retained placenta.  The patient did agree with this.  Therefore, Vanessa P. Haygood, M.D., took the patient to the OR where a dilatation and evacuation were performed under spinal anesthesia.  The estimated blood loss was less than 100 cc.  The patient tolerated the procedure well and was taken to the recovery room in good condition.  Later in the morning of December 03, 2000, the patient was doing well.  She was afebrile.  Her CBC was within normal limits with a hemoglobin of 11.2 and a WBC count of 12.6.  Platelets were 234. ANA, ACLA, and LAC were pending.  The abdomen was soft and nontender.  The patient did desire to go home.  A consultation was held with Janine Limbo, M.D., and a plan was made to discharge the patient home.  DISCHARGE INSTRUCTIONS:       Per routine postoperative D&E.  DISCHARGE MEDICATIONS:        1. Motrin 600 mg p.o. per liquid form q.6h.                                  p.r.n. pain.                               2. Doxycycline 100 mg one p.o. b.i.d. x 7 days.                               3. Methergine 0.2 mg p.o. q.i.d. x 2 days.  DISCHARGE FOLLOW-UP:          Will occur in two weeks at [facility]. She will follow up with [place] on an as needed with fever, bleeding, or any other problems. Dictated by:   Nigel Bridgeman,  C.N.M. Attending Physician:  Michael Litter DD:  12/03/00 TD:  12/04/00 Job: [id number]

## 2011-09-22 LAB — POCT URINALYSIS DIP (DEVICE)
Glucose, UA: NEGATIVE
Nitrite: NEGATIVE
Operator id: 116391
Urobilinogen, UA: 4 — ABNORMAL HIGH

## 2011-09-22 LAB — GC/CHLAMYDIA PROBE AMP, GENITAL: GC Probe Amp, Genital: NEGATIVE

## 2011-09-22 LAB — HERPES SIMPLEX VIRUS CULTURE

## 2011-09-22 LAB — WET PREP, GENITAL: Yeast Wet Prep HPF POC: NONE SEEN

## 2011-09-22 LAB — POCT PREGNANCY, URINE
Operator id: 116391
Preg Test, Ur: NEGATIVE

## 2013-09-11 ENCOUNTER — Encounter: Payer: Self-pay | Admitting: *Deleted

## 2013-09-11 ENCOUNTER — Encounter: Payer: Self-pay | Admitting: Obstetrics & Gynecology

## 2013-10-02 ENCOUNTER — Encounter: Payer: Self-pay | Admitting: *Deleted

## 2013-10-23 ENCOUNTER — Encounter: Payer: 59 | Admitting: Obstetrics & Gynecology

## 2013-10-28 ENCOUNTER — Encounter (HOSPITAL_COMMUNITY): Payer: Self-pay | Admitting: Emergency Medicine

## 2013-10-28 ENCOUNTER — Emergency Department (HOSPITAL_COMMUNITY)
Admission: EM | Admit: 2013-10-28 | Discharge: 2013-10-28 | Disposition: A | Discharge status: Home / Self Care | Payer: BC Managed Care – PPO | Source: Home / Self Care

## 2013-10-28 DIAGNOSIS — J029 Acute pharyngitis, unspecified: Secondary | ICD-10-CM

## 2013-10-28 DIAGNOSIS — R220 Localized swelling, mass and lump, head: Secondary | ICD-10-CM

## 2013-10-28 DIAGNOSIS — K08109 Complete loss of teeth, unspecified cause, unspecified class: Secondary | ICD-10-CM

## 2013-10-28 DIAGNOSIS — K08409 Partial loss of teeth, unspecified cause, unspecified class: Secondary | ICD-10-CM

## 2013-10-28 LAB — POCT RAPID STREP A: Streptococcus, Group A Screen (Direct): NEGATIVE

## 2013-10-28 MED ORDER — TRAMADOL HCL 50 MG PO TABS: 50.0000 mg | ORAL_TABLET | Freq: Four times a day (QID) | ORAL | Status: DC | PRN

## 2013-10-28 MED ORDER — AMOXICILLIN 500 MG PO CAPS: 1000.0000 mg | ORAL_CAPSULE | Freq: Two times a day (BID) | ORAL | Status: DC

## 2013-10-28 NOTE — ED Notes (Signed)
Pt c/o sore throat onset Sunday Sxs include: odynophagia, fevers... Reports she just recently had a tooth pulled Denies: v/n/d, cold sxs Alert w/no signs of acute distress.

## 2013-10-28 NOTE — ED Provider Notes (Signed)
CSN: [id number]     Arrival date & time 10/28/13  1827 History   First MD Initiated Contact with Patient 10/28/13 1856     Chief Complaint  Patient presents with  . Sore Throat   (Consider location/radiation/quality/duration/timing/severity/associated sxs/prior Treatment) HPI Comments: 32 year old female is complaining of a sore throat. It began approximately 4 days ago. This coincides approximately the time that she had a right upper third molar extracted. She is complaining of pain at the tooth site. Just states she has had PND.   History reviewed. No pertinent past medical history. History reviewed. No pertinent past surgical history. No family history on file. History  Substance Use Topics  . Smoking status: Never Smoker   . Smokeless tobacco: Not on file  . Alcohol Use: No   OB History   Grav Para Term Preterm Abortions TAB SAB Ect Mult Living                 Review of Systems  Constitutional: Positive for fever. Negative for activity change and fatigue.  HENT: Positive for postnasal drip and sore throat. Negative for congestion.   Respiratory: Negative.   Cardiovascular: Negative.   Gastrointestinal: Negative.   Neurological: Negative.     Allergies  Review of patient's allergies indicates no known allergies.  Home Medications   Current Outpatient Rx  Name  Route  Sig  Dispense  Refill  . amoxicillin (AMOXIL) 500 MG capsule   Oral   Take 2 capsules (1,000 mg total) by mouth 2 (two) times daily.   28 capsule   0   . traMADol (ULTRAM) 50 MG tablet   Oral   Take 1 tablet (50 mg total) by mouth every 6 (six) hours as needed.   15 tablet   0    BP 144/77  Pulse 88  Temp(Src) 100.3 F (37.9 C) (Oral)  Resp 16  SpO2 100% Physical Exam  Nursing note and vitals reviewed. Constitutional: She is oriented to person, place, and time. She appears well-developed and well-nourished. No distress.  HENT:  Mouth/Throat: Oropharynx is clear and moist. No  oropharyngeal exudate.  There is swelling and erythema of the gingiva surrounding the extracted tooth. There is also a laceration to the adjacent buccal mucosa.  Eyes: Conjunctivae and EOM are normal.  Neck: Normal range of motion. Neck supple.  Pulmonary/Chest: Effort normal. No respiratory distress.  Lymphadenopathy:    She has no cervical adenopathy.  Neurological: She is alert and oriented to person, place, and time. She exhibits normal muscle tone.  Skin: Skin is warm and dry.  Psychiatric: She has a normal mood and affect.    ED Course  Procedures (including critical care time) Labs Review Labs Reviewed  CULTURE, GROUP A STREP  POCT RAPID STREP A ([facility] ONLY)   Imaging Review No results found.  Results for orders placed during the hospital encounter of 10/28/13  POCT RAPID STREP A ([facility] ONLY)      Result Value Range   Streptococcus, Group A Screen (Direct) NEGATIVE  NEGATIVE     MDM   1. Pharyngitis   2. Status post tooth extraction, unspecified edentulism   3. Gingival swelling    There may be an early infection around the extraction site. Treat with amoxicillin as below. Strep test is negative.  Ultram for pain prn Amoxicillin as dir  Hayden Rasmussen, NP 10/28/13 1948

## 2013-10-29 NOTE — ED Provider Notes (Signed)
Medical screening examination/treatment/procedure(s) were performed by a resident physician or non-physician practitioner and as the supervising physician I was immediately available for consultation/collaboration.  Rohnan Bartleson, MD    Sofiah Lyne S Angelea Penny, MD 10/29/13 0745 

## 2013-10-30 LAB — CULTURE, GROUP A STREP

## 2014-03-16 ENCOUNTER — Other Ambulatory Visit: Payer: Self-pay | Admitting: General Practice

## 2014-03-16 DIAGNOSIS — N852 Hypertrophy of uterus: Secondary | ICD-10-CM

## 2014-03-16 NOTE — Progress Notes (Signed)
Patient referred from [facility]; Dr Ihor Dow recommends pelvic ultrasound prior to visit. Ultrasound scheduled for 4/13 @ 3pm. Called patient, no answer- left message that we are trying to reach you in regards to an appt, please call us back at the clinics

## 2014-03-17 NOTE — Progress Notes (Signed)
Called pt. And informed her of the need to have pelvic ultrasound prior to coming to clinic appointment. Informed pt. Of date and time of ultrasound as well as the number for radiology if she needs to reschedule. Informed pt. She will need to have to drink 32oz of water prior to test; and will need to hold bladder for entire exam. Advised pt. To call clinic a week after ultrasound if she has not heard about an appointment for clinic. Pt. Verbalized understanding and gratitude and had no further questions or concerns.

## 2014-03-18 ENCOUNTER — Encounter: Payer: Self-pay | Admitting: Obstetrics & Gynecology

## 2014-03-23 ENCOUNTER — Ambulatory Visit (HOSPITAL_COMMUNITY)
Admission: RE | Admit: 2014-03-23 | Discharge: 2014-03-23 | Disposition: A | Discharge status: Home / Self Care | Payer: BC Managed Care – PPO | Source: Ambulatory Visit | Attending: Obstetrics & Gynecology | Admitting: Obstetrics & Gynecology

## 2014-03-23 DIAGNOSIS — D250 Submucous leiomyoma of uterus: Secondary | ICD-10-CM | POA: Insufficient documentation

## 2014-03-23 DIAGNOSIS — N852 Hypertrophy of uterus: Secondary | ICD-10-CM | POA: Insufficient documentation

## 2014-04-03 ENCOUNTER — Encounter: Payer: Self-pay | Admitting: *Deleted

## 2014-04-08 ENCOUNTER — Emergency Department (HOSPITAL_COMMUNITY)
Admission: EM | Admit: 2014-04-08 | Discharge: 2014-04-08 | Disposition: A | Discharge status: Home / Self Care | Payer: BC Managed Care – PPO | Attending: Emergency Medicine | Admitting: Emergency Medicine

## 2014-04-08 ENCOUNTER — Encounter (HOSPITAL_COMMUNITY): Payer: Self-pay | Admitting: Emergency Medicine

## 2014-04-08 DIAGNOSIS — R102 Pelvic and perineal pain: Secondary | ICD-10-CM

## 2014-04-08 DIAGNOSIS — Z79899 Other long term (current) drug therapy: Secondary | ICD-10-CM | POA: Insufficient documentation

## 2014-04-08 DIAGNOSIS — Z3202 Encounter for pregnancy test, result negative: Secondary | ICD-10-CM | POA: Insufficient documentation

## 2014-04-08 DIAGNOSIS — N949 Unspecified condition associated with female genital organs and menstrual cycle: Secondary | ICD-10-CM | POA: Insufficient documentation

## 2014-04-08 DIAGNOSIS — N912 Amenorrhea, unspecified: Secondary | ICD-10-CM | POA: Insufficient documentation

## 2014-04-08 DIAGNOSIS — Z791 Long term (current) use of non-steroidal anti-inflammatories (NSAID): Secondary | ICD-10-CM | POA: Insufficient documentation

## 2014-04-08 LAB — URINALYSIS, ROUTINE W REFLEX MICROSCOPIC
BILIRUBIN URINE: NEGATIVE
Glucose, UA: NEGATIVE mg/dL
HGB URINE DIPSTICK: NEGATIVE
KETONES UR: NEGATIVE mg/dL
Nitrite: NEGATIVE
PROTEIN: NEGATIVE mg/dL
Specific Gravity, Urine: 1.013 (ref 1.005–1.030)
UROBILINOGEN UA: 1 mg/dL (ref 0.0–1.0)
pH: 6 (ref 5.0–8.0)

## 2014-04-08 LAB — CBC WITH DIFFERENTIAL/PLATELET
Basophils Absolute: 0 10*3/uL (ref 0.0–0.1)
Basophils Relative: 0 % (ref 0–1)
EOS PCT: 0 % (ref 0–5)
Eosinophils Absolute: 0 10*3/uL (ref 0.0–0.7)
HCT: 37.9 % (ref 36.0–46.0)
Hemoglobin: 13.7 g/dL (ref 12.0–15.0)
LYMPHS ABS: 0.7 10*3/uL (ref 0.7–4.0)
Lymphocytes Relative: 2 % — ABNORMAL LOW (ref 12–46)
MCH: 27.8 pg (ref 26.0–34.0)
MCHC: 36.1 g/dL — ABNORMAL HIGH (ref 30.0–36.0)
MCV: 76.9 fL — AB (ref 78.0–100.0)
MONOS PCT: 5 % (ref 3–12)
Monocytes Absolute: 1.8 10*3/uL — ABNORMAL HIGH (ref 0.1–1.0)
Neutro Abs: 33.2 10*3/uL — ABNORMAL HIGH (ref 1.7–7.7)
Neutrophils Relative %: 93 % — ABNORMAL HIGH (ref 43–77)
PLATELETS: 342 10*3/uL (ref 150–400)
RBC: 4.93 MIL/uL (ref 3.87–5.11)
RDW: 13.4 % (ref 11.5–15.5)
WBC: 35.7 10*3/uL — AB (ref 4.0–10.5)

## 2014-04-08 LAB — COMPREHENSIVE METABOLIC PANEL
ALK PHOS: 78 U/L (ref 39–117)
ALT: 36 U/L — AB (ref 0–35)
AST: 22 U/L (ref 0–37)
Albumin: 3.5 g/dL (ref 3.5–5.2)
BILIRUBIN TOTAL: 0.5 mg/dL (ref 0.3–1.2)
BUN: 9 mg/dL (ref 6–23)
CHLORIDE: 94 meq/L — AB (ref 96–112)
CO2: 22 meq/L (ref 19–32)
CREATININE: 0.93 mg/dL (ref 0.50–1.10)
Calcium: 9.4 mg/dL (ref 8.4–10.5)
GFR calc Af Amer: >90 mL/min (ref >90)
GFR, EST NON AFRICAN AMERICAN: 80 mL/min — AB (ref >90)
Glucose, Bld: 138 mg/dL — ABNORMAL HIGH (ref 70–99)
POTASSIUM: 3.8 meq/L (ref 3.7–5.3)
SODIUM: 134 meq/L — AB (ref 137–147)
Total Protein: 8.7 g/dL — ABNORMAL HIGH (ref 6.0–8.3)

## 2014-04-08 LAB — PREGNANCY, URINE: Preg Test, Ur: NEGATIVE

## 2014-04-08 LAB — WET PREP, GENITAL
TRICH WET PREP: NONE SEEN
YEAST WET PREP: NONE SEEN

## 2014-04-08 LAB — URINE MICROSCOPIC-ADD ON

## 2014-04-08 MED ORDER — ONDANSETRON HCL 8 MG PO TABS: 8.0000 mg | ORAL_TABLET | Freq: Three times a day (TID) | ORAL | Status: DC | PRN

## 2014-04-08 MED ORDER — AZITHROMYCIN 250 MG PO TABS
1000.0000 mg | ORAL_TABLET | Freq: Once | ORAL | Status: AC
  Administered 2014-04-08: 1000 mg via ORAL
  Filled 2014-04-08: qty 4

## 2014-04-08 MED ORDER — IBUPROFEN 800 MG PO TABS
800.0000 mg | ORAL_TABLET | Freq: Once | ORAL | Status: AC
  Administered 2014-04-08: 800 mg via ORAL
  Filled 2014-04-08: qty 1

## 2014-04-08 MED ORDER — STERILE WATER FOR INJECTION IJ SOLN
INTRAMUSCULAR | Status: AC
  Administered 2014-04-08: 0.9 mL
  Filled 2014-04-08: qty 10

## 2014-04-08 MED ORDER — CEFTRIAXONE SODIUM 250 MG IJ SOLR
250.0000 mg | Freq: Once | INTRAMUSCULAR | Status: AC
  Administered 2014-04-08: 250 mg via INTRAMUSCULAR
  Filled 2014-04-08: qty 250

## 2014-04-08 MED ORDER — IBUPROFEN 800 MG PO TABS: 800.0000 mg | ORAL_TABLET | Freq: Three times a day (TID) | ORAL | Status: DC

## 2014-04-08 MED ORDER — ONDANSETRON 8 MG PO TBDP
8.0000 mg | ORAL_TABLET | Freq: Once | ORAL | Status: AC
  Administered 2014-04-08: 8 mg via ORAL
  Filled 2014-04-08: qty 1

## 2014-04-08 MED ORDER — METRONIDAZOLE 500 MG PO TABS: 500.0000 mg | ORAL_TABLET | Freq: Two times a day (BID) | ORAL | Status: DC

## 2014-04-08 NOTE — ED Provider Notes (Signed)
CSN: [id number]     Arrival date & time 04/08/14  1152 History   First MD Initiated Contact with Patient 04/08/14 1301     Chief Complaint  Patient presents with  . Abdominal Pain     (Consider location/radiation/quality/duration/timing/severity/associated sxs/prior Treatment) HPI Patient complains of low crampy abdominal pain onset 2 or 3 weeks ago becoming worse over the past 2-3 days. Pain is intermittent lasting for minutes at a time goes away for possibly 5 minutes no fever no vaginal discharge last bowel movement normal, yesterday. Treated with ibuprofen 200 mg, with out relief. No urinary symptoms. Nothing makes pain better or worse. Pain moderate at present.  No past medical history on file. No past surgical history on file. past medical history negative Past OB/GYN history gravida 3 para 03 stillborns No family history on file. History  Substance Use Topics  . Smoking status: Never Smoker   . Smokeless tobacco: Not on file  . Alcohol Use: No   OB History   Grav Para Term Preterm Abortions TAB SAB Ect Mult Living                 Review of Systems  Constitutional: Negative.   HENT: Negative.   Respiratory: Negative.   Cardiovascular: Negative.   Gastrointestinal: Positive for abdominal pain.  Genitourinary:       Amenorrheic for the past 4 years  Musculoskeletal: Negative.   Skin: Negative.   Neurological: Negative.   Psychiatric/Behavioral: Negative.   All other systems reviewed and are negative.     Allergies  Review of patient's allergies indicates no known allergies.  Home Medications   Prior to Admission medications   Medication Sig Start Date End Date Taking? Authorizing Provider  ibuprofen (ADVIL,MOTRIN) 200 MG tablet Take 200 mg by mouth every 6 (six) hours as needed for mild pain.   Yes Historical Provider, MD  norethindrone-ethinyl estradiol (BREVICON, 28,) 0.5-35 MG-MCG tablet Take 1 tablet by mouth daily.   Yes Historical Provider, MD  Prenatal  Vit-Fe Fumarate-FA (PRENATAL MULTIVITAMIN) TABS tablet Take 1 tablet by mouth daily at 12 noon.   Yes Historical Provider, MD   BP 122/70  Pulse 108  Temp(Src) 99 F (37.2 C) (Oral)  Resp 16  SpO2 100% Physical Exam  Nursing note and vitals reviewed. Constitutional: She appears well-developed and well-nourished.  HENT:  Head: Normocephalic and atraumatic.  Eyes: Conjunctivae are normal. Pupils are equal, round, and reactive to light.  Neck: Neck supple. No tracheal deviation present. No thyromegaly present.  Cardiovascular: Normal rate and regular rhythm.   No murmur heard. Pulmonary/Chest: Effort normal and breath sounds normal.  Abdominal: Soft. Bowel sounds are normal. She exhibits no distension. There is no tenderness.  Suprapubic tenderness  Genitourinary:  Whitish discharge in vault. No cervical motion tenderness cervical os closed. No adnexal tenderness or masses. Uterine fundus mildly tender.  Musculoskeletal: Normal range of motion. She exhibits no edema and no tenderness.  Neurological: She is alert. Coordination normal.  Skin: Skin is warm and dry. No rash noted.  Psychiatric: She has a normal mood and affect.    ED Course  Procedures (including critical care time) Labs Review Labs Reviewed  URINALYSIS, ROUTINE W REFLEX MICROSCOPIC - Abnormal; Notable for the following:    Color, Urine AMBER (*)    APPearance CLOUDY (*)    Leukocytes, UA TRACE (*)    All other components within normal limits  PREGNANCY, URINE  URINE MICROSCOPIC-ADD ON  CBC WITH DIFFERENTIAL  COMPREHENSIVE METABOLIC  PANEL    Imaging Review No results found.   EKG Interpretation None     1540 p.m. patient became nauseated after taking ibuprofen and Zithromax orally. Her pain is improved after treatment with ibuprofen. She was administered Zofran. 4:20 PM she feels ready to go home no longer nauseated Results for orders placed during the hospital encounter of 04/08/14  WET PREP, GENITAL       Result Value Ref Range   Yeast Wet Prep HPF POC NONE SEEN  NONE SEEN   Trich, Wet Prep NONE SEEN  NONE SEEN   Clue Cells Wet Prep HPF POC MANY (*) NONE SEEN   WBC, Wet Prep HPF POC FEW (*) NONE SEEN  CBC WITH DIFFERENTIAL      Result Value Ref Range   WBC 35.7 (*) 4.0 - 10.5 K/uL   RBC 4.93  3.87 - 5.11 MIL/uL   Hemoglobin 13.7  12.0 - 15.0 g/dL   HCT 37.9  36.0 - 46.0 %   MCV 76.9 (*) 78.0 - 100.0 fL   MCH 27.8  26.0 - 34.0 pg   MCHC 36.1 (*) 30.0 - 36.0 g/dL   RDW 13.4  11.5 - 15.5 %   Platelets 342  150 - 400 K/uL   Neutrophils Relative % 93 (*) 43 - 77 %   Lymphocytes Relative 2 (*) 12 - 46 %   Monocytes Relative 5  3 - 12 %   Eosinophils Relative 0  0 - 5 %   Basophils Relative 0  0 - 1 %   Neutro Abs 33.2 (*) 1.7 - 7.7 K/uL   Lymphs Abs 0.7  0.7 - 4.0 K/uL   Monocytes Absolute 1.8 (*) 0.1 - 1.0 K/uL   Eosinophils Absolute 0.0  0.0 - 0.7 K/uL   Basophils Absolute 0.0  0.0 - 0.1 K/uL   WBC Morphology VACUOLATED NEUTROPHILS    COMPREHENSIVE METABOLIC PANEL      Result Value Ref Range   Sodium 134 (*) 137 - 147 mEq/L   Potassium 3.8  3.7 - 5.3 mEq/L   Chloride 94 (*) 96 - 112 mEq/L   CO2 22  19 - 32 mEq/L   Glucose, Bld 138 (*) 70 - 99 mg/dL   BUN 9  6 - 23 mg/dL   Creatinine, Ser 0.93  0.50 - 1.10 mg/dL   Calcium 9.4  8.4 - 10.5 mg/dL   Total Protein 8.7 (*) 6.0 - 8.3 g/dL   Albumin 3.5  3.5 - 5.2 g/dL   AST 22  0 - 37 U/L   ALT 36 (*) 0 - 35 U/L   Alkaline Phosphatase 78  39 - 117 U/L   Total Bilirubin 0.5  0.3 - 1.2 mg/dL   GFR calc non Af Amer 80 (*) >90 mL/min   GFR calc Af Amer >90  >90 mL/min  URINALYSIS, ROUTINE W REFLEX MICROSCOPIC      Result Value Ref Range   Color, Urine AMBER (*) YELLOW   APPearance CLOUDY (*) CLEAR   Specific Gravity, Urine 1.013  1.005 - 1.030   pH 6.0  5.0 - 8.0   Glucose, UA NEGATIVE  NEGATIVE mg/dL   Hgb urine dipstick NEGATIVE  NEGATIVE   Bilirubin Urine NEGATIVE  NEGATIVE   Ketones, ur NEGATIVE  NEGATIVE mg/dL   Protein,  ur NEGATIVE  NEGATIVE mg/dL   Urobilinogen, UA 1.0  0.0 - 1.0 mg/dL   Nitrite NEGATIVE  NEGATIVE   Leukocytes, UA TRACE (*) NEGATIVE  PREGNANCY,  URINE      Result Value Ref Range   Preg Test, Ur NEGATIVE  NEGATIVE  URINE MICROSCOPIC-ADD ON      Result Value Ref Range   Squamous Epithelial / LPF RARE  RARE   WBC, UA 0-2  <3 WBC/hpf   US Transvaginal Non-ob  03/23/2014   CLINICAL DATA:  Enlarged uterus. History of D and C. The patient is on birth control pills. No cycles for 4 years. Premenopausal.  EXAM: TRANSABDOMINAL AND TRANSVAGINAL ULTRASOUND OF PELVIS  TECHNIQUE: Both transabdominal and transvaginal ultrasound examinations of the pelvis were performed. Transabdominal technique was performed for global imaging of the pelvis including uterus, ovaries, adnexal regions, and pelvic cul-de-sac. It was necessary to proceed with endovaginal exam following the transabdominal exam to visualize the endometrium, ovaries, adnexal regions.  COMPARISON:  None applicable  FINDINGS: Uterus  Measurements: 7.5 x 5.4 x 6.0 cm. The uterus is heterogeneous with multiple fibroids identified. A posterior mid uterine fibroid is submucosal in location and measures 1.9 x 1.3 x 1.7 cm. An anterior central fibroid is also submucosal in location and measures 1.3 x 1.3 x 1.0 cm. A fundal myometrial fibroid is 0.5 x 0.4 x 0.6 cm.  Endometrium  Thickness: 2.9 mm.  No focal abnormality visualized.  Right ovary  Measurements: 2.3 x 1.3 x 1.6 cm. Normal appearance/no adnexal mass.  Left ovary  Measurements: 2.2 x 1.5 x 1.5 cm. Normal appearance/no adnexal mass.  Other findings  No free fluid.  IMPRESSION: 1. Multiple uterine fibroids. Two of the fibroids appear to be submucosal in location. 2. Normal appearance of the endometrial stripe. 3. Normal appearance of both ovaries.   Electronically Signed   By: Shon Hale M.D.   On: 03/23/2014 17:14   US Pelvis Complete  03/23/2014   CLINICAL DATA:  Enlarged uterus. History of D and C.  The patient is on birth control pills. No cycles for 4 years. Premenopausal.  EXAM: TRANSABDOMINAL AND TRANSVAGINAL ULTRASOUND OF PELVIS  TECHNIQUE: Both transabdominal and transvaginal ultrasound examinations of the pelvis were performed. Transabdominal technique was performed for global imaging of the pelvis including uterus, ovaries, adnexal regions, and pelvic cul-de-sac. It was necessary to proceed with endovaginal exam following the transabdominal exam to visualize the endometrium, ovaries, adnexal regions.  COMPARISON:  None applicable  FINDINGS: Uterus  Measurements: 7.5 x 5.4 x 6.0 cm. The uterus is heterogeneous with multiple fibroids identified. A posterior mid uterine fibroid is submucosal in location and measures 1.9 x 1.3 x 1.7 cm. An anterior central fibroid is also submucosal in location and measures 1.3 x 1.3 x 1.0 cm. A fundal myometrial fibroid is 0.5 x 0.4 x 0.6 cm.  Endometrium  Thickness: 2.9 mm.  No focal abnormality visualized.  Right ovary  Measurements: 2.3 x 1.3 x 1.6 cm. Normal appearance/no adnexal mass.  Left ovary  Measurements: 2.2 x 1.5 x 1.5 cm. Normal appearance/no adnexal mass.  Other findings  No free fluid.  IMPRESSION: 1. Multiple uterine fibroids. Two of the fibroids appear to be submucosal in location. 2. Normal appearance of the endometrial stripe. 3. Normal appearance of both ovaries.   Electronically Signed   By: Shon Hale M.D.   On: 03/23/2014 17:14    MDM  Patient does not appear clinically ill. I don't believe she is exhibiting signs of sepsis. Leukocytosis not correlate clinically. She has no fever we will treat him. We with antibiotics given vaginal discharge and pelvic pain Final diagnoses:  None  plan prescription Flagyl, Motrin, Zofran. She is to keep her scheduled appointment at [facility] hospital clinic in 2 days.  #1 pelvic pain #2 vaginitis #3 hyperglycemia   Orlie Dakin, MD 04/08/14 1624

## 2014-04-08 NOTE — ED Notes (Signed)
Pt states she has been having abdominal pain for 2-3 weeks.  Pt was dx with enlarged uterus at [facility] hospital.  Pt is to go back to [facility] clinic on Friday.  Pt states pain has gotten worse since yesterday and cannot wait to see MD.  Pt also states some dizziness.  No pain meds at home except taking ibuprofen.

## 2014-04-08 NOTE — Discharge Instructions (Signed)
Pelvic Pain Keep your scheduled appointment at the [facility] hospital clinic in 2 days. Go to the maternity admissions unit at [facility] hospital if her pain worsens, if you develop fever or if you are unable hold down medication without vomiting. Pelvic pain is pain felt below the belly button and between your hips. It can be caused by many different things. It is important to get help right away. This is especially true for severe, sharp, or unusual pain that comes on suddenly.  HOME CARE  Only take medicine as told by your doctor.  Rest as told by your doctor.  Eat a healthy diet, such as fruits, vegetables, and lean meats.  Drink enough fluids to keep your pee (urine) clear or pale yellow, or as told.  Avoid sex (intercourse) if it causes pain.  Apply warm or cold packs to your lower belly (abdomen). Use the type of pack that helps the pain.  Avoid situations that cause you stress.  Keep a journal to track your pain. Write down:  When the pain started.  Where it is located.  If there are things that seem to be related to the pain, such as food or your period.  Follow up with your doctor as told. GET HELP RIGHT AWAY IF:   You have heavy bleeding from the vagina.  You have more pelvic pain.  You feel lightheaded or pass out (faint).  You have chills.  You have pain when you pee or have blood in your pee.  You cannot stop having watery poop (diarrhea).  You cannot stop throwing up (vomiting).  You have a fever or lasting symptoms for more than 3 days.  You have a fever and your symptoms suddenly get worse.  You are being physically or sexually abused.  Your medicine does not help your pain.  You have fluid (discharge) coming from your vagina that is not normal. MAKE SURE YOU:  Understand these instructions.  Will watch your condition.  Will get help if you are not doing well or get worse. Document Released: 05/15/2008 Document Revised: 05/28/2012 Document  Reviewed: 03/18/2012 [facility] Patient Information 2014 [employer], [place]. Vaginitis Vaginitis is an inflammation of the vagina. It can happen when the normal bacteria and yeast in the vagina grow too much. There are different types. Treatment will depend on the type you have. HOME CARE  Take all medicines as told by your doctor.  Keep your vagina area clean and dry. Avoid soap. Rinse the area with water.  Avoid washing and cleaning out the vagina (douching).  Do not use tampons or have sex (intercourse) until your treatment is done.  Wipe from front to back after going to the restroom.  Wear cotton underwear.  Avoid wearing underwear while you sleep until your vaginitis is gone.  Avoid tight pants. Avoid underwear or nylons without a cotton panel.  Take off wet clothing (such as a bathing suit) as soon as you can.  Use mild, unscented products. Avoid fabric softeners and scented:  Feminine sprays.  Laundry detergents.  Tampons.  Soaps or bubble baths.  Practice safe sex and use condoms. GET HELP RIGHT AWAY IF:   You have belly (abdominal) pain.  You have a fever or lasting symptoms for more than 2 3 days.  You have a fever and your symptoms suddenly get worse. MAKE SURE YOU:   Understand these instructions.  Will watch this condition.  Will get help right away if you are not doing well or get worse.  Document Released: 02/23/2009 Document Revised: 08/21/2012 Document Reviewed: 05/09/2012 [facility] Patient Information 2014 [place].

## 2014-04-09 LAB — GC/CHLAMYDIA PROBE AMP
CT PROBE, AMP APTIMA: NEGATIVE
GC PROBE AMP APTIMA: NEGATIVE

## 2014-04-09 LAB — HIV ANTIBODY (ROUTINE TESTING W REFLEX): HIV 1&2 Ab, 4th Generation: NONREACTIVE

## 2014-04-09 LAB — RPR

## 2014-04-10 ENCOUNTER — Ambulatory Visit (INDEPENDENT_AMBULATORY_CARE_PROVIDER_SITE_OTHER): Payer: BC Managed Care – PPO | Admitting: Obstetrics & Gynecology

## 2014-04-10 ENCOUNTER — Encounter: Payer: Self-pay | Admitting: Obstetrics & Gynecology

## 2014-04-10 VITALS — BP 120/75 | HR 74 | Temp 97.4°F | Ht 59.0 in | Wt 134.7 lb

## 2014-04-10 DIAGNOSIS — N912 Amenorrhea, unspecified: Secondary | ICD-10-CM

## 2014-04-10 DIAGNOSIS — K589 Irritable bowel syndrome without diarrhea: Secondary | ICD-10-CM

## 2014-04-10 DIAGNOSIS — D259 Leiomyoma of uterus, unspecified: Secondary | ICD-10-CM

## 2014-04-10 MED ORDER — DICYCLOMINE HCL 10 MG PO CAPS: 10.0000 mg | ORAL_CAPSULE | Freq: Three times a day (TID) | ORAL | Status: DC

## 2014-04-10 NOTE — Progress Notes (Signed)
Patient ID: [name], female   DOB: February 07, 1981, 33 y.o.   MRN: [id number]  Chief Complaint  Patient presents with  . Gynecologic Exam    HPI [name] is a 33 y.o. female.  [id number] No LMP recorded. Patient has had an injection.No menses 5 years s/p DMPA, now on OCP no w/d bleeding. Few weeks of LLQ pain associated with sx gas and some relief with BM or flatus. No diarrhea, constipation   HPI  History reviewed. No pertinent past medical history.  History reviewed. No pertinent past surgical history.  Family History  Problem Relation Age of Onset  . Hypertension Mother   . Diabetes Maternal Aunt   . Hypertension Maternal Aunt     Social History History  Substance Use Topics  . Smoking status: Never Smoker   . Smokeless tobacco: Not on file  . Alcohol Use: No    No Known Allergies  Current Outpatient Prescriptions  Medication Sig Dispense Refill  . ibuprofen (ADVIL,MOTRIN) 200 MG tablet Take 200 mg by mouth every 6 (six) hours as needed for mild pain.      Marland Kitchen ibuprofen (ADVIL,MOTRIN) 800 MG tablet Take 1 tablet (800 mg total) by mouth 3 (three) times daily.  21 tablet  0  . metroNIDAZOLE (FLAGYL) 500 MG tablet Take 1 tablet (500 mg total) by mouth 2 (two) times daily. One po bid x 7 days  14 tablet  0  . norethindrone-ethinyl estradiol (BREVICON, 28,) 0.5-35 MG-MCG tablet Take 1 tablet by mouth daily.      . ondansetron (ZOFRAN) 8 MG tablet Take 1 tablet (8 mg total) by mouth every 8 (eight) hours as needed for nausea or vomiting.  12 tablet  0  . Prenatal Vit-Fe Fumarate-FA (PRENATAL MULTIVITAMIN) TABS tablet Take 1 tablet by mouth daily at 12 noon.      . dicyclomine (BENTYL) 10 MG capsule Take 1 capsule (10 mg total) by mouth 4 (four) times daily -  before meals and at bedtime.  100 capsule  2   No current facility-administered medications for this visit.    Review of Systems Review of Systems  Blood pressure 120/75, pulse 74, temperature 97.4  F (36.3 C), height 4\' 11"  (1.499 m), weight 134 lb 11.2 oz (61.1 kg).  Physical Exam Physical Exam  Data Reviewed  CLINICAL DATA: Enlarged uterus. History of D and C. The patient is  on birth control pills. No cycles for 4 years. Premenopausal.  EXAM:  TRANSABDOMINAL AND TRANSVAGINAL ULTRASOUND OF PELVIS  TECHNIQUE:  Both transabdominal and transvaginal ultrasound examinations of the  pelvis were performed. Transabdominal technique was performed for  global imaging of the pelvis including uterus, ovaries, adnexal  regions, and pelvic cul-de-sac. It was necessary to proceed with  endovaginal exam following the transabdominal exam to visualize the  endometrium, ovaries, adnexal regions.  COMPARISON: None applicable  FINDINGS:  Uterus  Measurements: 7.5 x 5.4 x 6.0 cm. The uterus is heterogeneous with  multiple fibroids identified. A posterior mid uterine fibroid is  submucosal in location and measures 1.9 x 1.3 x 1.7 cm. An anterior  central fibroid is also submucosal in location and measures 1.3 x  1.3 x 1.0 cm. A fundal myometrial fibroid is 0.5 x 0.4 x 0.6 cm.  Endometrium  Thickness: 2.9 mm. No focal abnormality visualized.  Right ovary  Measurements: 2.3 x 1.3 x 1.6 cm. Normal appearance/no adnexal mass.  Left ovary  Measurements: 2.2 x 1.5 x 1.5 cm. Normal appearance/no adnexal  mass.  Other findings  No free fluid.  IMPRESSION:  1. Multiple uterine fibroids. Two of the fibroids appear to be  submucosal in location.  2. Normal appearance of the endometrial stripe.  3. Normal appearance of both ovaries.  Electronically Signed  By: Shon Hale M.D.  On: 03/23/2014 17:14  Assessment    Abdominal pain, fibroids, GI Sx     Plan    Primary care f/u and may need GI        Woodroe Mode 04/10/2014, 10:21 AM

## 2014-04-10 NOTE — Progress Notes (Signed)
No cycle since 2010, has been on Depo Provera several years, stopped Depo September 2014.  Episode Wednesday with abdominal pain had to go ER [place])  Antibiotic prescribed.  Pt has copy of records.

## 2014-04-10 NOTE — Patient Instructions (Signed)
Abdominal Pain, Women °Abdominal (stomach, pelvic, or belly) pain can be caused by many things. It is important to tell your doctor: °· The location of the pain. °· Does it come and go or is it present all the time? °· Are there things that start the pain (eating certain foods, exercise)? °· Are there other symptoms associated with the pain (fever, nausea, vomiting, diarrhea)? °All of this is helpful to know when trying to find the cause of the pain. °CAUSES  °· Stomach: virus or bacteria infection, or ulcer. °· Intestine: appendicitis (inflamed appendix), regional ileitis (Crohn's disease), ulcerative colitis (inflamed colon), irritable bowel syndrome, diverticulitis (inflamed diverticulum of the colon), or cancer of the stomach or intestine. °· Gallbladder disease or stones in the gallbladder. °· Kidney disease, kidney stones, or infection. °· Pancreas infection or cancer. °· Fibromyalgia (pain disorder). °· Diseases of the female organs: °· Uterus: fibroid (non-cancerous) tumors or infection. °· Fallopian tubes: infection or tubal pregnancy. °· Ovary: cysts or tumors. °· Pelvic adhesions (scar tissue). °· Endometriosis (uterus lining tissue growing in the pelvis and on the pelvic organs). °· Pelvic congestion syndrome (female organs filling up with blood just before the menstrual period). °· Pain with the menstrual period. °· Pain with ovulation (producing an egg). °· Pain with an IUD (intrauterine device, birth control) in the uterus. °· Cancer of the female organs. °· Functional pain (pain not caused by a disease, may improve without treatment). °· Psychological pain. °· Depression. °DIAGNOSIS  °Your doctor will decide the seriousness of your pain by doing an examination. °· Blood tests. °· X-rays. °· Ultrasound. °· CT scan (computed tomography, special type of X-ray). °· MRI (magnetic resonance imaging). °· Cultures, for infection. °· Barium enema (dye inserted in the large intestine, to better view it with  X-rays). °· Colonoscopy (looking in intestine with a lighted tube). °· Laparoscopy (minor surgery, looking in abdomen with a lighted tube). °· Major abdominal exploratory surgery (looking in abdomen with a large incision). °TREATMENT  °The treatment will depend on the cause of the pain.  °· Many cases can be observed and treated at home. °· Over-the-counter medicines recommended by your caregiver. °· Prescription medicine. °· Antibiotics, for infection. °· Birth control pills, for painful periods or for ovulation pain. °· Hormone treatment, for endometriosis. °· Nerve blocking injections. °· Physical therapy. °· Antidepressants. °· Counseling with a psychologist or psychiatrist. °· Minor or major surgery. °HOME CARE INSTRUCTIONS  °· Do not take laxatives, unless directed by your caregiver. °· Take over-the-counter pain medicine only if ordered by your caregiver. Do not take aspirin because it can cause an upset stomach or bleeding. °· Try a clear liquid diet (broth or water) as ordered by your caregiver. Slowly move to a bland diet, as tolerated, if the pain is related to the stomach or intestine. °· Have a thermometer and take your temperature several times a day, and record it. °· Bed rest and sleep, if it helps the pain. °· Avoid sexual intercourse, if it causes pain. °· Avoid stressful situations. °· Keep your follow-up appointments and tests, as your caregiver orders. °· If the pain does not go away with medicine or surgery, you may try: °· Acupuncture. °· Relaxation exercises (yoga, meditation). °· Group therapy. °· Counseling. °SEEK MEDICAL CARE IF:  °· You notice certain foods cause stomach pain. °· Your home care treatment is not helping your pain. °· You need stronger pain medicine. °· You want your IUD removed. °· You feel faint or   lightheaded. °· You develop nausea and vomiting. °· You develop a rash. °· You are having side effects or an allergy to your medicine. °SEEK IMMEDIATE MEDICAL CARE IF:  °· Your  pain does not go away or gets worse. °· You have a fever. °· Your pain is felt only in portions of the abdomen. The right side could possibly be appendicitis. The left lower portion of the abdomen could be colitis or diverticulitis. °· You are passing blood in your stools (bright red or black tarry stools, with or without vomiting). °· You have blood in your urine. °· You develop chills, with or without a fever. °· You pass out. °MAKE SURE YOU:  °· Understand these instructions. °· Will watch your condition. °· Will get help right away if you are not doing well or get worse. °Document Released: 09/24/2007 Document Revised: 02/19/2012 Document Reviewed: 10/14/2009 °[employer]® Patient Information ©2014 [employer]. ° °

## 2014-04-13 DIAGNOSIS — K589 Irritable bowel syndrome without diarrhea: Secondary | ICD-10-CM | POA: Insufficient documentation

## 2014-04-13 DIAGNOSIS — N912 Amenorrhea, unspecified: Secondary | ICD-10-CM | POA: Insufficient documentation

## 2014-04-13 DIAGNOSIS — D259 Leiomyoma of uterus, unspecified: Secondary | ICD-10-CM | POA: Insufficient documentation

## 2014-04-13 HISTORY — DX: Irritable bowel syndrome, unspecified: K58.9

## 2014-04-15 ENCOUNTER — Encounter: Payer: Self-pay | Admitting: Family Medicine

## 2014-04-15 ENCOUNTER — Ambulatory Visit (INDEPENDENT_AMBULATORY_CARE_PROVIDER_SITE_OTHER): Payer: BC Managed Care – PPO | Admitting: Family Medicine

## 2014-04-15 VITALS — BP 110/78 | HR 88 | Temp 98.7°F | Ht 59.0 in | Wt 136.0 lb

## 2014-04-15 DIAGNOSIS — R899 Unspecified abnormal finding in specimens from other organs, systems and tissues: Secondary | ICD-10-CM

## 2014-04-15 DIAGNOSIS — R6889 Other general symptoms and signs: Secondary | ICD-10-CM

## 2014-04-15 DIAGNOSIS — R109 Unspecified abdominal pain: Secondary | ICD-10-CM

## 2014-04-15 LAB — COMPREHENSIVE METABOLIC PANEL
ALK PHOS: 59 U/L (ref 39–117)
ALT: 29 U/L (ref 0–35)
AST: 33 U/L (ref 0–37)
Albumin: 3.2 g/dL — ABNORMAL LOW (ref 3.5–5.2)
BILIRUBIN TOTAL: 0.1 mg/dL — AB (ref 0.2–1.2)
BUN: 11 mg/dL (ref 6–23)
CO2: 25 mEq/L (ref 19–32)
CREATININE: 0.9 mg/dL (ref 0.4–1.2)
Calcium: 8.9 mg/dL (ref 8.4–10.5)
Chloride: 103 mEq/L (ref 96–112)
GFR: 94.21 mL/min (ref >60.00)
Glucose, Bld: 100 mg/dL — ABNORMAL HIGH (ref 70–99)
Potassium: 3.8 mEq/L (ref 3.5–5.1)
Sodium: 136 mEq/L (ref 135–145)
Total Protein: 7.7 g/dL (ref 6.0–8.3)

## 2014-04-15 LAB — LIPID PANEL
Cholesterol: 109 mg/dL (ref 0–200)
HDL: 36.5 mg/dL — AB (ref >39.00)
LDL CALC: 48 mg/dL (ref 0–99)
TRIGLYCERIDES: 123 mg/dL (ref 0.0–149.0)
Total CHOL/HDL Ratio: 3
VLDL: 24.6 mg/dL (ref 0.0–40.0)

## 2014-04-15 LAB — HEMOGLOBIN A1C: Hgb A1c MFr Bld: 5.7 % (ref 4.6–6.5)

## 2014-04-15 LAB — CBC WITH DIFFERENTIAL/PLATELET
BASOS ABS: 0 10*3/uL (ref 0.0–0.1)
Basophils Relative: 0.3 % (ref 0.0–3.0)
EOS ABS: 0.2 10*3/uL (ref 0.0–0.7)
Eosinophils Relative: 2.5 % (ref 0.0–5.0)
HEMATOCRIT: 38.6 % (ref 36.0–46.0)
HEMOGLOBIN: 13 g/dL (ref 12.0–15.0)
LYMPHS ABS: 2.2 10*3/uL (ref 0.7–4.0)
LYMPHS PCT: 26.4 % (ref 12.0–46.0)
MCHC: 33.6 g/dL (ref 30.0–36.0)
MCV: 82.4 fl (ref 78.0–100.0)
Monocytes Absolute: 0.7 10*3/uL (ref 0.1–1.0)
Monocytes Relative: 8.1 % (ref 3.0–12.0)
NEUTROS ABS: 5.2 10*3/uL (ref 1.4–7.7)
Neutrophils Relative %: 62.7 % (ref 43.0–77.0)
Platelets: 411 10*3/uL — ABNORMAL HIGH (ref 150.0–400.0)
RBC: 4.69 Mil/uL (ref 3.87–5.11)
RDW: 13.7 % (ref 11.5–15.5)
WBC: 8.3 10*3/uL (ref 4.0–10.5)

## 2014-04-15 NOTE — Progress Notes (Signed)
No chief complaint on file.   HPI:  [name] is here to establish care. Has not had family doctor in the past. Seeing [facility] for her fibroids and recurrent pregnancy loss, paps, etc. After referred there from [facility] hospital. Seeing them next week.  Has the following chronic problems and concerns today:  In the end of April had vaginal discharge, fever and severe abd pain and seen in ED then by gyn. Treated with abx and has follow up with ob/gun. Had elevated white count per [facility]. Reports feels fine now with no further abdominal pain. Denies: abd pain, pelvic pain currently, fevers since, nausea, vomiting, change in bowels, hematochezia, vaginal bleeding, cough, malaise.  Patient Active Problem List   Diagnosis Date Noted  . Leiomyoma of uterus, unspecified 04/13/2014  . IBS (irritable bowel syndrome) 04/13/2014  . Amenorrhea due to Depo Provera 04/13/2014     ROS: See pertinent positives and negatives per HPI.  Past Medical History  Diagnosis Date  . Sickle cell trait     Family History  Problem Relation Age of Onset  . Hypertension Mother   . Diabetes Maternal Aunt   . Hypertension Maternal Aunt     History   Social History  . Marital Status: Single    Spouse Name: N/A    Number of Children: N/A  . Years of Education: N/A   Social History Main Topics  . Smoking status: Never Smoker   . Smokeless tobacco: None  . Alcohol Use: No  . Drug Use: No  . Sexual Activity: Yes    Birth Control/ Protection: Pill   Other Topics Concern  . None   Social History Narrative   Work or School: works as Scientist, water quality at [employer] Situation: lives alone      Spiritual Beliefs:Christian      Lifestyle: no regular CV exercise;  Diet is not great             Current outpatient prescriptions:norethindrone-ethinyl estradiol (BREVICON, 28,) 0.5-35 MG-MCG tablet, Take 1 tablet by mouth daily., Disp: , Rfl: ;  Prenatal Vit-Fe Fumarate-FA (PRENATAL  MULTIVITAMIN) TABS tablet, Take 1 tablet by mouth daily at 12 noon., Disp: , Rfl: ;  ibuprofen (ADVIL,MOTRIN) 800 MG tablet, Take 1 tablet (800 mg total) by mouth 3 (three) times daily., Disp: 21 tablet, Rfl: 0  EXAM:  Filed Vitals:   04/15/14 1044  BP: 110/78  Pulse: 88  Temp: 98.7 F (37.1 C)    Body mass index is 27.45 kg/(m^2).  GENERAL: vitals reviewed and listed above, alert, oriented, appears well hydrated and in no acute distress  HEENT: atraumatic, conjunttiva clear, no obvious abnormalities on inspection of external nose and ears  NECK: no obvious masses on inspection  LUNGS: clear to auscultation bilaterally, no wheezes, rales or rhonchi, good air movement  CV: HRRR, no peripheral edema  ABD: soft, NTTP  MS: moves all extremities without noticeable abnormality  PSYCH: pleasant and cooperative, no obvious depression or anxiety  ASSESSMENT AND PLAN:  Discussed the following assessment and plan:  Abnormal laboratory test result - Plan: CBC with Differential, Hemoglobin A1c, CMP, Lipid panel  Abdominal pain - Plan: CBC with Differential, Hemoglobin A1c, CMP, Lipid panel  -We reviewed the PMH, PSH, FH, SH, Meds and Allergies. -We provided refills for any medications we will prescribe as needed. -We addressed current concerns per orders and patient instructions. -We have asked for records for pertinent exams, studies, vaccines and notes  from previous providers. -We have advised patient to follow up per instructions below.   -Patient advised to return or notify a doctor immediately if symptoms worsen or persist or new concerns arise.  Patient Instructions  -We have ordered labs or studies at this visit. It can take up to 1-2 weeks for results and processing. We will contact you with instructions IF your results are abnormal. Normal results will be released to your [facility]. If you have not heard from [place] or can not find your results in [facility] in 2 weeks please  contact our office.  -PLEASE SIGN UP FOR MYCHART TODAY   We recommend the following healthy lifestyle measures: - eat a healthy diet consisting of lots of vegetables, fruits, beans, nuts, seeds, healthy meats such as white chicken and fish and whole grains.  - avoid fried foods, fast food, processed foods, sodas, red meet and other fattening foods.  - get a least 150 minutes of aerobic exercise per week.   Follow up in: pending lab results      [name]

## 2014-04-15 NOTE — Patient Instructions (Signed)
-  We have ordered labs or studies at this visit. It can take up to 1-2 weeks for results and processing. We will contact you with instructions IF your results are abnormal. Normal results will be released to your [facility] If you have not heard from [place] or can not find your results in [facility] in 2 weeks please contact our office.  -PLEASE SIGN UP FOR MYCHART TODAY   We recommend the following healthy lifestyle measures: - eat a healthy diet consisting of lots of vegetables, fruits, beans, nuts, seeds, healthy meats such as white chicken and fish and whole grains.  - avoid fried foods, fast food, processed foods, sodas, red meet and other fattening foods.  - get a least 150 minutes of aerobic exercise per week.   Follow up in: pending lab results

## 2014-04-15 NOTE — Progress Notes (Signed)
Pre visit review using our clinic review tool, if applicable. No additional management support is needed unless otherwise documented below in the visit note. 

## 2014-10-12 ENCOUNTER — Encounter: Payer: Self-pay | Admitting: Family Medicine

## 2014-11-23 ENCOUNTER — Encounter (HOSPITAL_COMMUNITY): Payer: Self-pay | Admitting: Emergency Medicine

## 2014-11-23 ENCOUNTER — Emergency Department (HOSPITAL_COMMUNITY): Payer: BC Managed Care – PPO

## 2014-11-23 ENCOUNTER — Emergency Department (HOSPITAL_COMMUNITY)
Admission: EM | Admit: 2014-11-23 | Discharge: 2014-11-23 | Disposition: A | Discharge status: Home / Self Care | Payer: BC Managed Care – PPO | Attending: Emergency Medicine | Admitting: Emergency Medicine

## 2014-11-23 DIAGNOSIS — R55 Syncope and collapse: Secondary | ICD-10-CM | POA: Diagnosis present

## 2014-11-23 DIAGNOSIS — Z91040 Latex allergy status: Secondary | ICD-10-CM | POA: Insufficient documentation

## 2014-11-23 DIAGNOSIS — Z3202 Encounter for pregnancy test, result negative: Secondary | ICD-10-CM | POA: Diagnosis not present

## 2014-11-23 DIAGNOSIS — Z862 Personal history of diseases of the blood and blood-forming organs and certain disorders involving the immune mechanism: Secondary | ICD-10-CM | POA: Diagnosis not present

## 2014-11-23 DIAGNOSIS — Z79899 Other long term (current) drug therapy: Secondary | ICD-10-CM | POA: Insufficient documentation

## 2014-11-23 LAB — URINALYSIS, ROUTINE W REFLEX MICROSCOPIC
BILIRUBIN URINE: NEGATIVE
Glucose, UA: NEGATIVE mg/dL
Hgb urine dipstick: NEGATIVE
KETONES UR: NEGATIVE mg/dL
NITRITE: NEGATIVE
PH: 6 (ref 5.0–8.0)
Protein, ur: NEGATIVE mg/dL
Specific Gravity, Urine: 1.016 (ref 1.005–1.030)
Urobilinogen, UA: 0.2 mg/dL (ref 0.0–1.0)

## 2014-11-23 LAB — PREGNANCY, URINE: Preg Test, Ur: NEGATIVE

## 2014-11-23 LAB — URINE MICROSCOPIC-ADD ON

## 2014-11-23 LAB — BASIC METABOLIC PANEL
ANION GAP: 13 (ref 5–15)
BUN: 10 mg/dL (ref 6–23)
CALCIUM: 9.5 mg/dL (ref 8.4–10.5)
CO2: 22 meq/L (ref 19–32)
CREATININE: 0.95 mg/dL (ref 0.50–1.10)
Chloride: 101 mEq/L (ref 96–112)
GFR calc Af Amer: >90 mL/min (ref >90)
GFR calc non Af Amer: 78 mL/min — ABNORMAL LOW (ref >90)
Glucose, Bld: 87 mg/dL (ref 70–99)
Potassium: 4.4 mEq/L (ref 3.7–5.3)
Sodium: 136 mEq/L — ABNORMAL LOW (ref 137–147)

## 2014-11-23 LAB — CBC
HEMATOCRIT: 40 % (ref 36.0–46.0)
Hemoglobin: 14 g/dL (ref 12.0–15.0)
MCH: 27.8 pg (ref 26.0–34.0)
MCHC: 35 g/dL (ref 30.0–36.0)
MCV: 79.5 fL (ref 78.0–100.0)
PLATELETS: 289 10*3/uL (ref 150–400)
RBC: 5.03 MIL/uL (ref 3.87–5.11)
RDW: 13.1 % (ref 11.5–15.5)
WBC: 10.6 10*3/uL — ABNORMAL HIGH (ref 4.0–10.5)

## 2014-11-23 LAB — CBG MONITORING, ED: Glucose-Capillary: 97 mg/dL (ref 70–99)

## 2014-11-23 NOTE — ED Notes (Signed)
Pt had near syncope episode at work today, pt states she got weak and things blacked out and got dizzy. Denies n/v or LOC.

## 2014-11-23 NOTE — ED Provider Notes (Signed)
CSN: [id number]     Arrival date & time 11/23/14  1303 History   First MD Initiated Contact with Patient 11/23/14 1309     Chief Complaint  Patient presents with  . Near Syncope     (Consider location/radiation/quality/duration/timing/severity/associated sxs/prior Treatment) Patient is a 33 y.o. female presenting with near-syncope.  Near Syncope This is a new problem. The current episode started less than 1 hour ago. The problem occurs constantly. The problem has been resolved. Pertinent negatives include no chest pain, no abdominal pain, no headaches and no shortness of breath. Nothing aggravates the symptoms. Nothing relieves the symptoms. She has tried nothing for the symptoms.    Past Medical History  Diagnosis Date  . Sickle cell trait    History reviewed. No pertinent past surgical history. Family History  Problem Relation Age of Onset  . Hypertension Mother   . Diabetes Maternal Aunt   . Hypertension Maternal Aunt    History  Substance Use Topics  . Smoking status: Never Smoker   . Smokeless tobacco: Not on file  . Alcohol Use: No   OB History    Gravida Para Term Preterm AB TAB SAB Ectopic Multiple Living   3 1 0 1 2  2    0     Review of Systems  Respiratory: Negative for shortness of breath.   Cardiovascular: Positive for near-syncope. Negative for chest pain.  Gastrointestinal: Negative for abdominal pain.  Neurological: Negative for headaches.  All other systems reviewed and are negative.     Allergies  Latex  Home Medications   Prior to Admission medications   Medication Sig Start Date End Date Taking? Authorizing Provider  ibuprofen (ADVIL,MOTRIN) 800 MG tablet Take 1 tablet (800 mg total) by mouth 3 (three) times daily. Patient taking differently: Take 800 mg by mouth every 8 (eight) hours as needed for moderate pain.  04/08/14  Yes Orlie Dakin, MD  norethindrone-ethinyl estradiol (BREVICON, 28,) 0.5-35 MG-MCG tablet Take 1 tablet by mouth at  bedtime.    Yes Historical Provider, MD  Prenatal Vit-Fe Fumarate-FA (PRENATAL MULTIVITAMIN) TABS tablet Take 1 tablet by mouth daily at 12 noon.   Yes Historical Provider, MD   BP 125/70 mmHg  Pulse 79  Resp 18  SpO2 96% Physical Exam  Constitutional: She is oriented to person, place, and time. She appears well-developed and well-nourished.  HENT:  Head: Normocephalic and atraumatic.  Right Ear: External ear normal.  Left Ear: External ear normal.  Eyes: Conjunctivae and EOM are normal. Pupils are equal, round, and reactive to light.  Neck: Normal range of motion. Neck supple.  Cardiovascular: Normal rate, regular rhythm, normal heart sounds and intact distal pulses.   Pulmonary/Chest: Effort normal and breath sounds normal.  Abdominal: Soft. Bowel sounds are normal. There is no tenderness.  Musculoskeletal: Normal range of motion.  Neurological: She is alert and oriented to person, place, and time.  Skin: Skin is warm and dry.  Vitals reviewed.   ED Course  Procedures (including critical care time) Labs Review Labs Reviewed  CBC - Abnormal; Notable for the following:    WBC 10.6 (*)    All other components within normal limits  BASIC METABOLIC PANEL - Abnormal; Notable for the following:    Sodium 136 (*)    GFR calc non Af Amer 78 (*)    All other components within normal limits  URINALYSIS, ROUTINE W REFLEX MICROSCOPIC  CBG MONITORING, ED  POC URINE PREG, ED    Imaging Review  Dg Chest 2 View  11/23/2014   CLINICAL DATA:  Initial encounter for near syncopal episode at work today.  EXAM: CHEST  2 VIEW  COMPARISON:  04/20/2005.  FINDINGS: The lungs are clear without focal infiltrate, edema, pneumothorax or pleural effusion. The cardiopericardial silhouette is within normal limits for size. Imaged bony structures of the thorax are intact. Telemetry leads overlie the chest.  IMPRESSION: Normal exam.   Electronically Signed   By: Misty Stanley M.D.   On: 11/23/2014 14:30      EKG Interpretation   Date/Time:  Monday November 23 2014 13:12:27 EST Ventricular Rate:  65 PR Interval:  110 QRS Duration: 77 QT Interval:  382 QTC Calculation: 397 R Axis:   62 Text Interpretation:  Sinus rhythm Borderline short PR interval No old  tracing to compare Confirmed by Debby Freiberg [phone]) on 11/23/2014  1:25:34 PM      MDM   Final diagnoses:  Near syncope    33 y.o. female without pertinent PMH presents with near syncopal episode 1 minute. Patient has never had similar symptoms before. She states she has had URI symptoms over the last week. No fevers. No chest pain, dyspnea, other symptoms today. On arrival vital signs and physical exam as above. Orthostatics negative.  WU as above unremarkable.  Unknown etiology at this time, however pt is at low risk for serious adverse events.  Doubt ACS, PE, PTX, PNA, or other emergent pathology.  DC home in stable condition  1. Near syncope         Debby Freiberg, MD 11/23/14 [phone]

## 2014-11-23 NOTE — Discharge Instructions (Signed)
Cardiac Event Monitoring A cardiac event monitor is a small recording device used to help detect abnormal heart rhythms (arrhythmias). The monitor is used to record heart rhythm when noticeable symptoms such as the following occur:  Fast heartbeats (palpitations), such as heart racing or fluttering.  Dizziness.  Fainting or light-headedness.  Unexplained weakness. The monitor is wired to two electrodes placed on your chest. Electrodes are flat, sticky disks that attach to your skin. The monitor can be worn for up to 30 days. You will wear the monitor at all times, except when bathing.  HOW TO USE YOUR CARDIAC EVENT MONITOR A technician will prepare your chest for the electrode placement. The technician will show you how to place the electrodes, how to work the monitor, and how to replace the batteries. Take time to practice using the monitor before you leave the office. Make sure you understand how to send the information from the monitor to your health care provider. This requires a telephone with a landline, not a cell phone. You need to:  Wear your monitor at all times, except when you are in water:  Do not get the monitor wet.  Take the monitor off when bathing. Do not swim or use a hot tub with it on.  Keep your skin clean. Do not put body lotion or moisturizer on your chest.  Change the electrodes daily or any time they stop sticking to your skin. You might need to use tape to keep them on.  It is possible that your skin under the electrodes could become irritated. To keep this from happening, try to put the electrodes in slightly different places on your chest. However, they must remain in the area under your left breast and in the upper right section of your chest.  Make sure the monitor is safely clipped to your clothing or in a location close to your body that your health care provider recommends.  Press the button to record when you feel symptoms of heart trouble, such as  dizziness, weakness, light-headedness, palpitations, thumping, shortness of breath, unexplained weakness, or a fluttering or racing heart. The monitor is always on and records what happened slightly before you pressed the button, so do not worry about being too late to get good information.  Keep a diary of your activities, such as walking, doing chores, and taking medicine. It is especially important to note what you were doing when you pushed the button to record your symptoms. This will help your health care provider determine what might be contributing to your symptoms. The information stored in your monitor will be reviewed by your health care provider alongside your diary entries.  Send the recorded information as recommended by your health care provider. It is important to understand that it will take some time for your health care provider to process the results.  Change the batteries as recommended by your health care provider. SEEK IMMEDIATE MEDICAL CARE IF:   You have chest pain.  You have extreme difficulty breathing or shortness of breath.  You develop a very fast heartbeat that persists.  You develop dizziness that does not go away.  You faint or constantly feel you are about to faint. Document Released: 09/05/2008 Document Revised: 04/13/2014 Document Reviewed: 05/26/2013 [facility] Patient Information 2015 [place], [place]. This information is not intended to replace advice given to you by your health care provider. Make sure you discuss any questions you have with your health care provider.  Near-Syncope Near-syncope (commonly known as  near fainting) is sudden weakness, dizziness, or feeling like you might pass out. During an episode of near-syncope, you may also develop pale skin, have tunnel vision, or feel sick to your stomach (nauseous). Near-syncope may occur when getting up after sitting or while standing for a long time. It is caused by a sudden decrease in blood flow to  the brain. This decrease can result from various causes or triggers, most of which are not serious. However, because near-syncope can sometimes be a sign of something serious, a medical evaluation is required. The specific cause is often not determined. HOME CARE INSTRUCTIONS  Monitor your condition for any changes. The following actions may help to alleviate any discomfort you are experiencing:  Have someone stay with you until you feel stable.  Lie down right away and prop your feet up if you start feeling like you might faint. Breathe deeply and steadily. Wait until all the symptoms have passed. Most of these episodes last only a few minutes. You may feel tired for several hours.   Drink enough fluids to keep your urine clear or pale yellow.   If you are taking blood pressure or heart medicine, get up slowly when seated or lying down. Take several minutes to sit and then stand. This can reduce dizziness.  Follow up with your health care provider as directed. SEEK IMMEDIATE MEDICAL CARE IF:   You have a severe headache.   You have unusual pain in the chest, abdomen, or back.   You are bleeding from the mouth or rectum, or you have black or tarry stool.   You have an irregular or very fast heartbeat.   You have repeated fainting or have seizure-like jerking during an episode.   You faint when sitting or lying down.   You have confusion.   You have difficulty walking.   You have severe weakness.   You have vision problems.  MAKE SURE YOU:   Understand these instructions.  Will watch your condition.  Will get help right away if you are not doing well or get worse. Document Released: 11/27/2005 Document Revised: 12/02/2013 Document Reviewed: 05/02/2013 [facility] Patient Information 2015 [place], [place]. This information is not intended to replace advice given to you by your health care provider. Make sure you discuss any questions you have with your health care  provider.

## 2014-11-24 NOTE — Progress Notes (Signed)
Patient ID: [name], female   DOB: [birth year], 33 y.o.   MRN: [id number]    33 yo seen in ER by Dr Colin Rhein 12/14 for "presyncope"  Lasted about a minute . Patient has never had similar symptoms before. She states she has had URI symptoms over the last week. No fevers. No chest pain, dyspnea, other symptoms today. Noted normal vitals and exam . Orthostatics negative. WU  unremarkable. and d/c from ER without diagnosis  I did note UA was cloudy with moderate leukocytes not addressed WBC also elevated at 10.6 Pregnancy test negative Thyroid not checked CXR with NAD  No high risk family history such as sudden death, HOCM or long QT ECG and telemetry normal Established with Edsel Petrin as primary in May Seen in ER then with vaginal d/c and elevated WBC Do not see that any antibiotics ever given History of fibroids and sickle cell trait Since d/c doing ok  Sedentary does not exercise much works in cafeteria at [employer]: Denies fever, malais, weight loss, blurry vision, decreased visual acuity, cough, sputum, SOB, hemoptysis, pleuritic pain, palpitaitons, heartburn, abdominal pain, melena, lower extremity edema, claudication, or rash.  All other systems reviewed and negative   General: Affect appropriate Healthy:  appears stated age 51: normal Neck supple with no adenopathy JVP normal no bruits no thyromegaly Lungs clear with no wheezing and good diaphragmatic motion Heart:  S1/S2 no murmur,rub, gallop or click PMI normal Abdomen: benighn, BS positve, no tenderness, no AAA no bruit.  No HSM or HJR Distal pulses intact with no bruits No edema Neuro non-focal Skin warm and dry No muscular weakness  Medications Current Outpatient Prescriptions  Medication Sig Dispense Refill  . ibuprofen (ADVIL,MOTRIN) 800 MG tablet Take 1 tablet (800 mg total) by mouth 3 (three) times daily. (Patient taking differently: Take 800 mg by mouth every 8 (eight) hours as needed for moderate pain. )  21 tablet 0  . norethindrone-ethinyl estradiol (BREVICON, 28,) 0.5-35 MG-MCG tablet Take 1 tablet by mouth at bedtime.     . Prenatal Vit-Fe Fumarate-FA (PRENATAL MULTIVITAMIN) TABS tablet Take 1 tablet by mouth daily at 12 noon.     No current facility-administered medications for this visit.    Allergies Latex  Family History: Family History  Problem Relation Age of Onset  . Hypertension Mother   . Diabetes Maternal Aunt   . Hypertension Maternal Aunt     Social History: History   Social History  . Marital Status: Single    Spouse Name: N/A    Number of Children: N/A  . Years of Education: N/A   Occupational History  . Not on file.   Social History Main Topics  . Smoking status: Never Smoker   . Smokeless tobacco: Not on file  . Alcohol Use: No  . Drug Use: No  . Sexual Activity: Yes    Birth Control/ Protection: Pill   Other Topics Concern  . 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  Past Medical History  Diagnosis Date  . Sickle cell trait     Electrocardiogram: 12!4  SR rate 65 PR  110  QAT 382 normal ECG   Assessment and Plan

## 2014-11-25 ENCOUNTER — Ambulatory Visit (INDEPENDENT_AMBULATORY_CARE_PROVIDER_SITE_OTHER): Payer: BC Managed Care – PPO | Admitting: Cardiovascular Disease

## 2014-11-25 ENCOUNTER — Encounter: Payer: Self-pay | Admitting: Cardiovascular Disease

## 2014-11-25 VITALS — BP 110/78 | HR 74 | Ht 59.0 in | Wt 140.0 lb

## 2014-11-25 DIAGNOSIS — N342 Other urethritis: Secondary | ICD-10-CM

## 2014-11-25 DIAGNOSIS — D250 Submucous leiomyoma of uterus: Secondary | ICD-10-CM

## 2014-11-25 DIAGNOSIS — N390 Urinary tract infection, site not specified: Secondary | ICD-10-CM | POA: Insufficient documentation

## 2014-11-25 DIAGNOSIS — R55 Syncope and collapse: Secondary | ICD-10-CM

## 2014-11-25 NOTE — Patient Instructions (Signed)

## 2014-11-25 NOTE — Addendum Note (Signed)
Addended by: Devra Dopp E on: 11/25/2014 11:51 AM   Modules accepted: Orders

## 2014-11-25 NOTE — Assessment & Plan Note (Signed)
Doubt cardiac etiology Echo if normal no further w/u

## 2014-11-25 NOTE — Assessment & Plan Note (Signed)
Will forward note to Dr Maudie Mercury  Patient indicates she took antibiotics for 7 days for vaginal infection.  In ER WBC still up and leukocytosis but no fever or dysuria not clear if she needs f/u culture of Rx

## 2014-11-25 NOTE — Assessment & Plan Note (Signed)
F/U OB pain control and management

## 2014-12-01 ENCOUNTER — Ambulatory Visit (HOSPITAL_COMMUNITY): Payer: BC Managed Care – PPO | Attending: Cardiology | Admitting: Radiology

## 2014-12-01 DIAGNOSIS — R55 Syncope and collapse: Secondary | ICD-10-CM | POA: Diagnosis present

## 2014-12-01 NOTE — Progress Notes (Signed)
Echocardiogram performed.  

## 2014-12-07 ENCOUNTER — Telehealth: Payer: Self-pay | Admitting: Cardiovascular Disease

## 2014-12-07 NOTE — Telephone Encounter (Signed)
I called X 2 and got the pts VM both times. Since she identified herself on her VM message and because she has spoken to one of our triage nurses today to get her results I left a detailed message explaining that her that her Echo results are normal per Dr Johnsie Cancel. I also left the office phone number for her to call back if she has any questions.

## 2014-12-07 NOTE — Telephone Encounter (Signed)
New Msg       Pt returning call, not sure who contacted her. Please return call.

## 2015-10-04 ENCOUNTER — Encounter: Payer: Self-pay | Admitting: Family Medicine

## 2015-10-04 ENCOUNTER — Ambulatory Visit (INDEPENDENT_AMBULATORY_CARE_PROVIDER_SITE_OTHER): Payer: 59 | Admitting: Family Medicine

## 2015-10-04 VITALS — BP 118/72 | HR 78 | Temp 98.7°F | Ht 59.0 in | Wt 151.1 lb

## 2015-10-04 DIAGNOSIS — E785 Hyperlipidemia, unspecified: Secondary | ICD-10-CM

## 2015-10-04 DIAGNOSIS — K219 Gastro-esophageal reflux disease without esophagitis: Secondary | ICD-10-CM | POA: Diagnosis not present

## 2015-10-04 DIAGNOSIS — E669 Obesity, unspecified: Secondary | ICD-10-CM | POA: Diagnosis not present

## 2015-10-04 DIAGNOSIS — R739 Hyperglycemia, unspecified: Secondary | ICD-10-CM | POA: Diagnosis not present

## 2015-10-04 LAB — HEMOGLOBIN A1C: Hgb A1c MFr Bld: 5.6 % (ref 4.6–6.5)

## 2015-10-04 LAB — CHOLESTEROL, TOTAL: Cholesterol: 186 mg/dL (ref 0–200)

## 2015-10-04 LAB — HDL CHOLESTEROL: HDL: 59.4 mg/dL (ref >39.00)

## 2015-10-04 NOTE — Progress Notes (Signed)
HPI:  [name] is a pleasant 75 you F whom I have seen once > 1 year ago, here for an a cute visit for:  "elevated blood pressure": -hx dyslipidemia and prediabetes on review of labs -no exercise, poor diet and reports has gained weight -she is worried about having diabetes as felt a little lightheaded a few times at work and checked BP and was 130s/80s - was told to follow up with PCP for "prehypertension" -denies: CP, SOB, DOE, palpitations, vision changes, HA  GERD: -only when eats chocolate and speghetti - no often -takes TUMs -no frequent reflux or heartburn, no dysphagia  ROS: See pertinent positives and negatives per HPI.  Past Medical History  Diagnosis Date  . Sickle cell trait ([place])     No past surgical history on file.  Family History  Problem Relation Age of Onset  . Hypertension Mother   . Diabetes Maternal Aunt   . Hypertension Maternal Aunt     Social History   Social History  . Marital Status: Single    Spouse Name: N/A  . Number of Children: N/A  . Years of Education: N/A   Social History Main Topics  . Smoking status: Never Smoker   . Smokeless tobacco: None  . Alcohol Use: No  . Drug Use: No  . Sexual Activity: Yes    Birth Control/ Protection: Pill   Other Topics Concern  . 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  ibuprofen (ADVIL,MOTRIN) 800 MG tablet, Take 1 tablet (800 mg total) by mouth 3 (three) times daily., Disp: 21 tablet, Rfl: 0 .  norethindrone-ethinyl estradiol (BREVICON, 28,) 0.5-35 MG-MCG tablet, Take 1 tablet by mouth at bedtime. , Disp: , Rfl:  .  Prenatal Vit-Fe Fumarate-FA (PRENATAL MULTIVITAMIN) TABS tablet, Take 1 tablet by mouth daily at 12 noon., Disp: , Rfl:   EXAM:  Filed  Vitals:   10/04/15 1450  BP: 118/72  Pulse: 78  Temp: 98.7 F (37.1 C)    Body mass index is 30.5 kg/(m^2).  GENERAL: vitals reviewed and listed above, alert, oriented, appears well hydrated and in no acute distress  HEENT: atraumatic, conjunttiva clear, no obvious abnormalities on inspection of external nose and ears  NECK: no obvious masses on inspection  LUNGS: clear to auscultation bilaterally, no wheezes, rales or rhonchi, good air movement  CV: HRRR, no peripheral edema  MS: moves all extremities without noticeable abnormality  PSYCH: pleasant and cooperative, no obvious depression or anxiety  ASSESSMENT AND PLAN:  Discussed the following assessment and plan:  Obesity  Dyslipidemia - Plan: Cholesterol, Total, HDL cholesterol  Hyperglycemia - Plan: Hemoglobin A1c  Gastroesophageal reflux disease, esophagitis presence not specified  -her blood pressure is great today -she ha snot been in to see [place] in some time and will get lipids and diabetes labs today -advised a healthy lifestyle and regular exercise -TUMS for occ GERD - follow up if worsens -follow up in 4-6 months -Patient advised to return or notify a doctor immediately if symptoms worsen or persist or new concerns arise.  Patient Instructions  BEFORE YOU LEAVE: -labs -schedule follow up in 4-6 months  We recommend the following healthy lifestyle measures: - eat a healthy whole foods diet consisting of regular small meals composed of vegetables, fruits, beans, nuts, seeds, healthy meats such as white chicken and fish and whole grains.  - avoid sweets, white starchy foods, fried foods, fast food, processed foods, sodas, red meet and other fattening foods.  - get a least 150-300 minutes of aerobic exercise per week.   -We have ordered labs or studies at this visit. It can take up to 1-2 weeks for results and processing. We will contact you with instructions IF your results are abnormal. Normal results will  be released to your [facility]. If you have not heard from [place] or can not find your results in [facility] in 2 weeks please contact our office.             Colin Benton R.

## 2015-10-04 NOTE — Patient Instructions (Addendum)
BEFORE YOU LEAVE: -labs -schedule follow up in 4-6 months  We recommend the following healthy lifestyle measures: - eat a healthy whole foods diet consisting of regular small meals composed of vegetables, fruits, beans, nuts, seeds, healthy meats such as white chicken and fish and whole grains.  - avoid sweets, white starchy foods, fried foods, fast food, processed foods, sodas, red meet and other fattening foods.  - get a least 150-300 minutes of aerobic exercise per week.   -We have ordered labs or studies at this visit. It can take up to 1-2 weeks for results and processing. We will contact you with instructions IF your results are abnormal. Normal results will be released to your [facility]. If you have not heard from [place] or can not find your results in [facility] in 2 weeks please contact our office.

## 2015-10-04 NOTE — Progress Notes (Signed)
Pre visit review using our clinic review tool, if applicable. No additional management support is needed unless otherwise documented below in the visit note. 

## 2015-10-05 ENCOUNTER — Telehealth: Payer: Self-pay | Admitting: Family Medicine

## 2015-10-05 NOTE — Telephone Encounter (Signed)
Patient informed. 

## 2015-10-05 NOTE — Telephone Encounter (Signed)
Patient returned your call and said you can call her anytime today because she is off work.

## 2016-01-18 DIAGNOSIS — R87810 Cervical high risk human papillomavirus (HPV) DNA test positive: Secondary | ICD-10-CM | POA: Diagnosis not present

## 2016-01-18 DIAGNOSIS — Z01419 Encounter for gynecological examination (general) (routine) without abnormal findings: Secondary | ICD-10-CM | POA: Diagnosis not present

## 2016-01-18 DIAGNOSIS — R87610 Atypical squamous cells of undetermined significance on cytologic smear of cervix (ASC-US): Secondary | ICD-10-CM | POA: Diagnosis not present

## 2016-02-11 DIAGNOSIS — R87610 Atypical squamous cells of undetermined significance on cytologic smear of cervix (ASC-US): Secondary | ICD-10-CM | POA: Diagnosis not present

## 2016-02-11 DIAGNOSIS — N870 Mild cervical dysplasia: Secondary | ICD-10-CM | POA: Diagnosis not present

## 2016-02-11 DIAGNOSIS — Z1151 Encounter for screening for human papillomavirus (HPV): Secondary | ICD-10-CM | POA: Diagnosis not present

## 2016-02-11 DIAGNOSIS — Z113 Encounter for screening for infections with a predominantly sexual mode of transmission: Secondary | ICD-10-CM | POA: Diagnosis not present

## 2016-04-06 ENCOUNTER — Emergency Department (HOSPITAL_COMMUNITY): Payer: PRIVATE HEALTH INSURANCE

## 2016-04-06 ENCOUNTER — Encounter (HOSPITAL_COMMUNITY): Payer: Self-pay | Admitting: Emergency Medicine

## 2016-04-06 ENCOUNTER — Emergency Department (HOSPITAL_COMMUNITY)
Admission: EM | Admit: 2016-04-06 | Discharge: 2016-04-06 | Disposition: A | Discharge status: Home / Self Care | Payer: PRIVATE HEALTH INSURANCE | Attending: Emergency Medicine | Admitting: Emergency Medicine

## 2016-04-06 DIAGNOSIS — I10 Essential (primary) hypertension: Secondary | ICD-10-CM | POA: Diagnosis not present

## 2016-04-06 DIAGNOSIS — Y9289 Other specified places as the place of occurrence of the external cause: Secondary | ICD-10-CM | POA: Diagnosis not present

## 2016-04-06 DIAGNOSIS — Y998 Other external cause status: Secondary | ICD-10-CM | POA: Insufficient documentation

## 2016-04-06 DIAGNOSIS — Y9389 Activity, other specified: Secondary | ICD-10-CM | POA: Diagnosis not present

## 2016-04-06 DIAGNOSIS — M79641 Pain in right hand: Secondary | ICD-10-CM | POA: Diagnosis not present

## 2016-04-06 DIAGNOSIS — Z91040 Latex allergy status: Secondary | ICD-10-CM | POA: Insufficient documentation

## 2016-04-06 DIAGNOSIS — W228XXA Striking against or struck by other objects, initial encounter: Secondary | ICD-10-CM | POA: Diagnosis not present

## 2016-04-06 DIAGNOSIS — S6991XA Unspecified injury of right wrist, hand and finger(s), initial encounter: Secondary | ICD-10-CM | POA: Diagnosis not present

## 2016-04-06 DIAGNOSIS — Z79899 Other long term (current) drug therapy: Secondary | ICD-10-CM | POA: Diagnosis not present

## 2016-04-06 DIAGNOSIS — Z793 Long term (current) use of hormonal contraceptives: Secondary | ICD-10-CM | POA: Insufficient documentation

## 2016-04-06 DIAGNOSIS — Z862 Personal history of diseases of the blood and blood-forming organs and certain disorders involving the immune mechanism: Secondary | ICD-10-CM | POA: Insufficient documentation

## 2016-04-06 HISTORY — DX: Essential (primary) hypertension: I10

## 2016-04-06 MED ORDER — ACETAMINOPHEN 325 MG PO TABS
650.0000 mg | ORAL_TABLET | Freq: Once | ORAL | Status: AC
  Administered 2016-04-06: 650 mg via ORAL
  Filled 2016-04-06: qty 2

## 2016-04-06 NOTE — ED Notes (Signed)
Pt hit hand on side of cash register this am. Pt having pain and slight swelling on back of R hand. Pain with movement

## 2016-04-06 NOTE — ED Provider Notes (Signed)
CSN: [id number]     Arrival date & time 04/06/16  1226 History  By signing my name below, I, [name], attest that this documentation has been prepared under the direction and in the presence of non-physician practitioner, Bernerd Limbo, PA-C. Electronically Signed: Evelene Arellano, Scribe. 04/06/2016. 1:23 PM.    Chief Complaint  Patient presents with  . Hand Injury    The history is provided by the patient. No language interpreter was used.    HPI Comments:  [name] is a 35 y.o. female who presents to the Emergency Department complaining of injury to the right hand sustained this AM. Pt states she struck her hand on a cash register while at work. She notes moderate constant pain to the site with associated swelling. She denies pain to the right wrist. She has no other injuries or complaints at this time. No exacerbating or alleviating factors noted.   Past Medical History  Diagnosis Date  . Sickle cell trait ([place])   . Hypertension    History reviewed. No pertinent past surgical history. Family History  Problem Relation Age of Onset  . Hypertension Mother   . Diabetes Maternal Aunt   . Hypertension Maternal Aunt    Social History  Substance Use Topics  . Smoking status: Never Smoker   . Smokeless tobacco: None  . Alcohol Use: No   OB History    Gravida Para Term Preterm AB TAB SAB Ectopic Multiple Living   3 1 0 1 2  2    0      Review of Systems  Respiratory: Negative for shortness of breath.   Cardiovascular: Negative for chest pain.  Musculoskeletal: Positive for myalgias and arthralgias.       Right hand  Neurological: Negative for weakness and numbness.    Allergies  Latex  Home Medications   Prior to Admission medications   Medication Sig Start Date End Date Taking? Authorizing Provider  ibuprofen (ADVIL,MOTRIN) 200 MG tablet Take 400 mg by mouth every 6 (six) hours as needed for moderate pain.   Yes Historical Provider, MD   norethindrone-ethinyl estradiol (BREVICON, 28,) 0.5-35 MG-MCG tablet Take 1 tablet by mouth at bedtime.    Yes Historical Provider, MD  Prenatal Vit-Fe Fumarate-FA (PRENATAL MULTIVITAMIN) TABS tablet Take 1 tablet by mouth daily at 12 noon.   Yes Historical Provider, MD  ibuprofen (ADVIL,MOTRIN) 800 MG tablet Take 1 tablet (800 mg total) by mouth 3 (three) times daily. Patient not taking: Reported on 04/06/2016 04/08/14   Orlie Dakin, MD    BP 133/75 mmHg  Pulse 67  Temp(Src) 97.9 F (36.6 C) (Oral)  Resp 18  Ht 4\' 11"  (1.499 m)  Wt 63.504 kg  BMI 28.26 kg/m2  SpO2 100% Physical Exam  Constitutional: She is oriented to person, place, and time. She appears well-developed and well-nourished. No distress.  HENT:  Head: Normocephalic and atraumatic.  Right Ear: External ear normal.  Left Ear: External ear normal.  Nose: Nose normal.  Eyes: Conjunctivae and EOM are normal. Right eye exhibits no discharge. Left eye exhibits no discharge. No scleral icterus.  Neck: Normal range of motion. Neck supple.  Cardiovascular: Normal rate, regular rhythm and intact distal pulses.   Pulmonary/Chest: Effort normal and breath sounds normal. No respiratory distress.  Musculoskeletal: Normal range of motion. She exhibits edema and tenderness.  TTP, mild erythema, and edema to dorsal aspect of right hand over 2nd and 3rd MCPs. Full ROM. Strength, sensation, distal pulses intact.  Neurological:  She is alert and oriented to person, place, and time. She has normal strength. No sensory deficit.  Skin: Skin is warm and dry. She is not diaphoretic.  Psychiatric: She has a normal mood and affect. Her behavior is normal.  Nursing note and vitals reviewed.   ED Course  Procedures   DIAGNOSTIC STUDIES:  Oxygen Saturation is 100% on RA, normal by my interpretation.    COORDINATION OF CARE:  1:04 PM Discussed treatment plan with pt at bedside and pt agreed to plan.  Imaging Review Dg Hand Complete  Right  04/06/2016  CLINICAL DATA:  Acute right hand pain after blunt trauma. Initial encounter. EXAM: RIGHT HAND - COMPLETE 3+ VIEW COMPARISON:  None. FINDINGS: There is no evidence of fracture or dislocation. There is no evidence of arthropathy or other focal bone abnormality. Soft tissues are unremarkable. IMPRESSION: Normal right hand. Electronically Signed   By: Marijo Conception, M.D.   On: 04/06/2016 13:30   I have personally reviewed and evaluated these images and lab results as part of my medical decision-making.  MDM   Final diagnoses:  Right hand pain    35 year old female presents with right hand pain after hitting her hand on a cash register. Denies numbness, weakness, paresthesia. Patient is afebrile. Vital signs stable. On exam, she has TTP, mild erythema, associated edema to the dorsal aspect of her right hand. Full range of motion. Patient is neurovascularly intact. Patient given tylenol and ice. Imaging negative for fracture or dislocation. Discussed findings with patient. She is nontoxic and well-appearing, feel she is stable for discharge at this time. Advised to rest, ice, and elevate at home to take tylenol or ibuprofen for pain. Patient to follow up with PCP. Return precautions discussed. Patient verbalizes her understanding and is in agreement with plan.  BP 133/75 mmHg  Pulse 67  Temp(Src) 97.9 F (36.6 C) (Oral)  Resp 18  Ht 4\' 11"  (1.499 m)  Wt 63.504 kg  BMI 28.26 kg/m2  SpO2 100%  I personally performed the services described in this documentation, which was scribed in my presence. The recorded information has been reviewed and is accurate.   Marella Chimes, PA-C 04/06/16 [place], MD 04/07/16 [phone]

## 2016-04-06 NOTE — Discharge Instructions (Signed)
1. Medications: tylenol or ibuprofen for pain, usual home medications 2. Treatment: rest, ice, elevate 3. Follow Up: please followup with your primary doctor for discussion of your diagnoses and further evaluation after today's visit; please return to the ER for increased pain, swelling, numbness, new or worsening symptoms

## 2016-04-06 NOTE — ED Notes (Signed)
Pt is waiting for urine drug screen. Discharge delayed

## 2016-08-17 ENCOUNTER — Encounter: Payer: Self-pay | Admitting: Family Medicine

## 2016-08-17 ENCOUNTER — Ambulatory Visit (INDEPENDENT_AMBULATORY_CARE_PROVIDER_SITE_OTHER): Payer: 59 | Admitting: Family Medicine

## 2016-08-17 VITALS — BP 120/70 | HR 78 | Temp 98.2°F | Ht 59.0 in | Wt 152.0 lb

## 2016-08-17 DIAGNOSIS — J069 Acute upper respiratory infection, unspecified: Secondary | ICD-10-CM

## 2016-08-17 NOTE — Patient Instructions (Signed)

## 2016-08-17 NOTE — Progress Notes (Signed)
HPI:  Acute visit for URI: -started: 4-5 days ago -symptoms:nasal congestion, sore throat, cough -denies:fever, SOB, NVD, tooth pain -has tried: OTC cold medication -sick contacts/travel/risks: no reported flu, strep or tick exposure -Hx of: allergies ROS: See pertinent positives and negatives per HPI.  Past Medical History:  Diagnosis Date  . Hypertension   . Sickle cell trait ([place])     No past surgical history on file.  Family History  Problem Relation Age of Onset  . Hypertension Mother   . Diabetes Maternal Aunt   . Hypertension Maternal Aunt     Social History   Social History  . Marital status: Single    Spouse name: N/A  . Number of children: N/A  . Years of education: N/A   Social History Main Topics  . Smoking status: Never Smoker  . Smokeless tobacco: None  . Alcohol use No  . Drug use: No  . Sexual activity: Yes    Birth control/ protection: Pill   Other Topics Concern  . 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  ibuprofen (ADVIL,MOTRIN) 200 MG tablet, Take 400 mg by mouth every 6 (six) hours as needed for moderate pain., Disp: , Rfl:  .  norethindrone-ethinyl estradiol (BREVICON, 28,) 0.5-35 MG-MCG tablet, Take 1 tablet by mouth at bedtime. , Disp: , Rfl:  .  Prenatal Vit-Fe Fumarate-FA (PRENATAL MULTIVITAMIN) TABS tablet, Take 1 tablet by mouth daily at 12 noon., Disp: , Rfl:   EXAM:  Vitals:   08/17/16 1620  BP: 120/70  Pulse: 78  Temp: 98.2 F (36.8 C)    Body mass index is 30.7 kg/m.  GENERAL: vitals reviewed and listed above, alert, oriented, appears well hydrated and in no acute distress  HEENT: atraumatic, conjunttiva clear, no obvious abnormalities on inspection of external nose and ears, normal  appearance of ear canals and TMs, clear nasal congestion, mild post oropharyngeal erythema with PND, no tonsillar edema or exudate, no sinus TTP  NECK: no obvious masses on inspection  LUNGS: clear to auscultation bilaterally, no wheezes, rales or rhonchi, good air movement  CV: HRRR, no peripheral edema  MS: moves all extremities without noticeable abnormality  PSYCH: pleasant and cooperative, no obvious depression or anxiety  ASSESSMENT AND PLAN:  Discussed the following assessment and plan:  Viral upper respiratory illness  -given HPI and exam findings today, a serious infection or illness is unlikely. We discussed potential etiologies, with VURI being most likely, and advised supportive care and monitoring. We discussed treatment side effects, likely course, antibiotic misuse, transmission, and signs of developing a serious illness. -of course, we advised to return or notify a doctor immediately if symptoms worsen or persist or new concerns arise.    Patient Instructions  INSTRUCTIONS FOR UPPER RESPIRATORY INFECTION:  -plenty of rest and fluids  -nasal saline wash 2-3 times daily (use prepackaged nasal saline or bottled/distilled water if making your own)   -can use AFRIN nasal spray for drainage and nasal congestion - but do NOT use longer then 3-4 days  -can use tylenol (in no history of liver disease) or ibuprofen (if no history of kidney disease, bowel bleeding or  significant heart disease) as directed for aches and sorethroat  -in the winter time, using a humidifier at night is helpful (please follow cleaning instructions)  -if you are taking a cough medication - use only as directed, may also try a teaspoon of honey to coat the throat and throat lozenges. If given a cough medication with codeine or hydrocodone or other narcotic please be advised that this contains a strong and  potentially addicting medication. Please follow instructions carefully, take as little as  possible and only use AS NEEDED for severe cough. Discuss potential side effects with your pharmacy. Please do not drive or operate machinery while taking these types of medications. Please do not take other sedating medications, drugs or alcohol while taking this medication without discussing with your doctor.  -for sore throat, salt water gargles can help  -follow up if you have fevers, facial pain, tooth pain, difficulty breathing or are worsening or symptoms persist longer then expected  Upper Respiratory Infection, Adult An upper respiratory infection (URI) is also known as the common cold. It is often caused by a type of germ (virus). Colds are easily spread (contagious). You can pass it to others by kissing, coughing, sneezing, or drinking out of the same glass. Usually, you get better in 1 to 3  weeks.  However, the cough can last for even longer. HOME CARE   Only take medicine as told by your doctor. Follow instructions provided above.  Drink enough water and fluids to keep your pee (urine) clear or pale yellow.  Get plenty of rest.  Return to work when your temperature is < 100 for 24 hours or as told by your doctor. You may use a face mask and wash your hands to stop your cold from spreading. GET HELP RIGHT AWAY IF:   After the first few days, you feel you are getting worse.  You have questions about your medicine.  You have chills, shortness of breath, or red spit (mucus).  You have pain in the face for more then 1-2 days, especially when you bend forward.  You have a fever, puffy (swollen) neck, pain when you swallow, or white spots in the back of your throat.  You have a bad headache, ear pain, sinus pain, or chest pain.  You have a high-pitched whistling sound when you breathe in and out (wheezing).  You cough up blood.  You have sore muscles or a stiff neck. MAKE SURE YOU:   Understand these instructions.  Will watch your condition.  Will get help right away  if you are not doing well or get worse. Document Released: 05/15/2008 Document Revised: 02/19/2012 Document Reviewed: 03/04/2014 [facility] Patient Information 2015 [place], [place]. This information is not intended to replace advice given to you by your health care provider. Make sure you discuss any questions you have with your health care provider.    Colin Benton R., DO

## 2016-09-08 DIAGNOSIS — N870 Mild cervical dysplasia: Secondary | ICD-10-CM | POA: Diagnosis not present

## 2016-09-08 DIAGNOSIS — R87610 Atypical squamous cells of undetermined significance on cytologic smear of cervix (ASC-US): Secondary | ICD-10-CM | POA: Diagnosis not present

## 2017-01-30 DIAGNOSIS — Z1151 Encounter for screening for human papillomavirus (HPV): Secondary | ICD-10-CM | POA: Diagnosis not present

## 2017-01-30 DIAGNOSIS — Z114 Encounter for screening for human immunodeficiency virus [HIV]: Secondary | ICD-10-CM | POA: Diagnosis not present

## 2017-01-30 DIAGNOSIS — R300 Dysuria: Secondary | ICD-10-CM | POA: Diagnosis not present

## 2017-01-30 DIAGNOSIS — Z1159 Encounter for screening for other viral diseases: Secondary | ICD-10-CM | POA: Diagnosis not present

## 2017-01-30 DIAGNOSIS — R102 Pelvic and perineal pain: Secondary | ICD-10-CM | POA: Diagnosis not present

## 2017-01-30 DIAGNOSIS — Z01419 Encounter for gynecological examination (general) (routine) without abnormal findings: Secondary | ICD-10-CM | POA: Diagnosis not present

## 2017-01-30 DIAGNOSIS — R87610 Atypical squamous cells of undetermined significance on cytologic smear of cervix (ASC-US): Secondary | ICD-10-CM | POA: Diagnosis not present

## 2017-01-30 DIAGNOSIS — Z3200 Encounter for pregnancy test, result unknown: Secondary | ICD-10-CM | POA: Diagnosis not present

## 2017-01-30 DIAGNOSIS — Z6830 Body mass index (BMI) 30.0-30.9, adult: Secondary | ICD-10-CM | POA: Diagnosis not present

## 2017-01-30 DIAGNOSIS — Z113 Encounter for screening for infections with a predominantly sexual mode of transmission: Secondary | ICD-10-CM | POA: Diagnosis not present

## 2017-02-28 DIAGNOSIS — N72 Inflammatory disease of cervix uteri: Secondary | ICD-10-CM | POA: Diagnosis not present

## 2017-02-28 DIAGNOSIS — R87610 Atypical squamous cells of undetermined significance on cytologic smear of cervix (ASC-US): Secondary | ICD-10-CM | POA: Diagnosis not present

## 2017-02-28 DIAGNOSIS — R87810 Cervical high risk human papillomavirus (HPV) DNA test positive: Secondary | ICD-10-CM | POA: Diagnosis not present

## 2017-02-28 DIAGNOSIS — N871 Moderate cervical dysplasia: Secondary | ICD-10-CM | POA: Diagnosis not present

## 2017-03-14 ENCOUNTER — Telehealth: Payer: Self-pay | Admitting: *Deleted

## 2017-03-14 NOTE — Telephone Encounter (Signed)
ASCUS with HPV/HR pos.  Schedule Colposcopy with me ASAP.

## 2017-03-14 NOTE — Telephone Encounter (Signed)
Pt received call from [facility] ob/gyn regarding abnormal pap results told to call office and schedule procedure? I will send you paper records to review. Please advise

## 2017-03-15 NOTE — Telephone Encounter (Signed)
Pt scheduled on 03/23/17 @ 9:30am

## 2017-03-15 NOTE — Telephone Encounter (Signed)
(  late entry send this to front desk this am) Transferred to front desk for claudia to call, claudia left a message for pt to call.

## 2017-03-23 ENCOUNTER — Ambulatory Visit (INDEPENDENT_AMBULATORY_CARE_PROVIDER_SITE_OTHER): Payer: 59 | Admitting: Obstetrics & Gynecology

## 2017-03-23 ENCOUNTER — Encounter: Payer: Self-pay | Admitting: Obstetrics & Gynecology

## 2017-03-23 VITALS — BP 128/80

## 2017-03-23 DIAGNOSIS — N871 Moderate cervical dysplasia: Secondary | ICD-10-CM

## 2017-03-23 DIAGNOSIS — N979 Female infertility, unspecified: Secondary | ICD-10-CM | POA: Diagnosis not present

## 2017-03-23 DIAGNOSIS — N96 Recurrent pregnancy loss: Secondary | ICD-10-CM

## 2017-03-23 NOTE — Progress Notes (Signed)
    [name] 1981/02/20 [id number]        35 y.o.  G3P030 Established patient presenting to discuss Colpo results from 02/2017.  Patho:  CIN 2.  HPV 16-18-45 neg.  Desires conception in the near future.  H/O 3 pregnancy losses in the 2nd trimester at 16 wks, then 2 losses at 21-23 wks.  3rd pregnancy was with a McDonald Cerclage at around 12 wks.  Probable cervical incompetence.  Past medical history,surgical history, problem list, medications, allergies, family history and social history were all reviewed and documented in the EPIC chart.  Directed ROS with pertinent positives and negatives documented in the history of present illness/assessment and plan.  Exam:  Vitals:   03/23/17 0925  BP: 128/80   General appearance:  Normal   Assessment/Plan:  36 y.o. G3P0120  1. Moderate cervical dysplasia Dx discussed.  LEEP vs Repeat Colpo 06/2017 reviewed.  Given desire to conceive in context of probable Cervical incompetence, decision not to submit her cervix to a LEEP which might worsen the cervical incompetence.  Will repeat Colpo 06/2017.  Importance of close f/u stressed.  Risk of progression to Cervical Cancer reviewed.  Folic Acid supplements recommended.    2. Recurrent pregnancy loss without current pregnancy 2nd trimester losses x 3.  Probable cervical Incompetence.  Failure of McDonald cerclage x 1.  Abdominal cerclage discussed.  3. Primary female infertility As above.  Desires eventual conception.  AMA 36 yo.  Counseling >50% x 25 min on Dx and management of CIN 2 given her desire to conceive in a context of Primary Infertility associated with 3 Pregnancy losses in the second trimester probably d/t Cervical Incompetence.   Princess Bruins MD, 9:55 AM 03/23/2017

## 2017-03-23 NOTE — Patient Instructions (Signed)
Diagnosis and management of CIN 2 (Moderate dysplasia of the cervix) with HPV 16-18-45 neg, given her desire to conceive in a context of Primary Infertility associated with 3 Pregnancy losses in the second trimester probably d/t Cervical Incompetence.  Will repeat Colpo in 3 months (06/2017) and then f/u to discuss Management of Pregnancy losses/Cervical Incompetence.  Abdominal Cerclage will probably be indicated.  Will obtain past Medical Records.

## 2017-04-12 ENCOUNTER — Ambulatory Visit (INDEPENDENT_AMBULATORY_CARE_PROVIDER_SITE_OTHER): Payer: 59 | Admitting: Family Medicine

## 2017-04-12 ENCOUNTER — Encounter: Payer: Self-pay | Admitting: Family Medicine

## 2017-04-12 VITALS — BP 120/80 | HR 75 | Temp 98.7°F | Ht 59.0 in | Wt 155.4 lb

## 2017-04-12 DIAGNOSIS — J329 Chronic sinusitis, unspecified: Secondary | ICD-10-CM | POA: Diagnosis not present

## 2017-04-12 NOTE — Progress Notes (Signed)
Pre visit review using our clinic review tool, if applicable. No additional management support is needed unless otherwise documented below in the visit note. 

## 2017-04-12 NOTE — Patient Instructions (Signed)
Follow up in 3 months.  Start zyrtec and take once daily at night.  flonase 2 sprays each nostril daily.  I hope you are feeling better soon! Seek care immediately if worsening, new concerns or you are not improving with treatment.

## 2017-04-12 NOTE — Progress Notes (Signed)
HPI:  Acute visit for allergic rhinitis: -started: last week -symptoms:nasal congestion, sneezing, itchy eyes, PND -denies:fever, SOB, NVD, tooth pain -has tried: benadryl which helps -sick contacts/travel/risks: no reported flu, strep or tick exposure -Hx of: allergies  ROS: See pertinent positives and negatives per HPI.  Past Medical History:  Diagnosis Date  . Hypertension   . Sickle cell trait ([place])     No past surgical history on file.  Family History  Problem Relation Age of Onset  . Hypertension Mother   . Diabetes Maternal Aunt   . Hypertension Maternal Aunt     Social History   Social History  . Marital status: Single    Spouse name: N/A  . Number of children: N/A  . Years of education: N/A   Social History Main Topics  . Smoking status: Never Smoker  . Smokeless tobacco: Never Used  . Alcohol use No  . Drug use: No  . Sexual activity: Yes    Birth control/ protection: Pill   Other Topics Concern  . 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  diphenhydrAMINE (BENADRYL) 25 mg capsule, Take 25 mg by mouth every 6 (six) hours as needed., Disp: , Rfl:  .  ibuprofen (ADVIL,MOTRIN) 200 MG tablet, Take 400 mg by mouth every 6 (six) hours as needed for moderate pain., Disp: , Rfl:  .  norethindrone-ethinyl estradiol (BREVICON, 28,) 0.5-35 MG-MCG tablet, Take 1 tablet by mouth at bedtime. , Disp: , Rfl:  .  Prenatal Vit-Fe Fumarate-FA (PRENATAL MULTIVITAMIN) TABS tablet, Take 1 tablet by mouth daily at 12 noon., Disp: , Rfl:   EXAM:  Vitals:   04/12/17 1452  BP: 120/80  Pulse: 75  Temp: 98.7 F (37.1 C)    Body mass index is 31.39 kg/m.  GENERAL: vitals reviewed and listed above, alert, oriented, appears well hydrated and in  no acute distress  HEENT: atraumatic, conjunttiva clear, no obvious abnormalities on inspection of external nose and ears, normal appearance of ear canals and TMs, boggy turbinates, clear nasal congestion, mild post oropharyngeal erythema with PND, no tonsillar edema or exudate, no sinus TTP  NECK: no obvious masses on inspection  LUNGS: clear to auscultation bilaterally, no wheezes, rales or rhonchi, good air movement  CV: HRRR, no peripheral edema  MS: moves all extremities without noticeable abnormality  PSYCH: pleasant and cooperative, no obvious depression or anxiety  ASSESSMENT AND PLAN:  Discussed the following assessment and plan:  Rhinosinusitis  -given HPI and exam findings today, a serious infection or illness is unlikely. We discussed potential etiologies, with allergic rhinitis being most likely, and advised  Longer acting antihistamine and INS. We discussed treatment side effects, likely course, antibiotic misuse, transmission, and signs of developing a serious illness. -of course, we advised to return or notify a doctor immediately if symptoms worsen or persist or new concerns arise.    Patient Instructions  Follow up in 3 months.  Start zyrtec and take once daily at night.  flonase 2 sprays each nostril daily.  I hope you are feeling better soon! Seek care immediately if worsening, new concerns or you are not improving with treatment.  Colin Benton R., DO

## 2017-06-15 ENCOUNTER — Encounter: Payer: Self-pay | Admitting: Obstetrics & Gynecology

## 2017-06-15 ENCOUNTER — Ambulatory Visit (INDEPENDENT_AMBULATORY_CARE_PROVIDER_SITE_OTHER): Payer: 59 | Admitting: Obstetrics & Gynecology

## 2017-06-15 VITALS — BP 124/78

## 2017-06-15 DIAGNOSIS — N871 Moderate cervical dysplasia: Secondary | ICD-10-CM

## 2017-06-15 DIAGNOSIS — N883 Incompetence of cervix uteri: Secondary | ICD-10-CM

## 2017-06-15 DIAGNOSIS — D259 Leiomyoma of uterus, unspecified: Secondary | ICD-10-CM

## 2017-06-15 DIAGNOSIS — N870 Mild cervical dysplasia: Secondary | ICD-10-CM | POA: Diagnosis not present

## 2017-06-15 NOTE — Progress Notes (Signed)
    [name] [birth year] [id number]        36 y.o.  [id number]   RP:  CIN 2 on 02/2017 for Colposcopy  HPI:  CIN 2 at 11 O'clock on 02/2017.  HPV 16-18-45 were negative.  Decision not to proceed with LEEP, but to observe closely, given her h/o Cervical incompetence with desire to conceive in the near future.  Well on BCPs currently, but h/o Uterine Fibroids, no Pelvic US x 2015.  Past medical history,surgical history, problem list, medications, allergies, family history and social history were all reviewed and documented in the EPIC chart.  Directed ROS with pertinent positives and negatives documented in the history of present illness/assessment and plan.  Exam:  Vitals:   06/15/17 0853  BP: 124/78   General appearance:  Normal  Colposcopy Procedure Note DIOSELINA BRUMBAUGH 06/15/2017  Indications:  CIN 2 on 02/2017, no LEEP done, close observation decided.  HPV 16-18-45 neg.  Desire to conceive/H/O Cervical incompetence.  Procedure Details  The risks and benefits of the procedure and Verbal informed consent obtained.  Speculum placed in vagina and excellent visualization of cervix achieved, cervix swabbed x 3 with acetic acid solution.  Findings:    Cervix colposcopy:    Physical Exam  Genitourinary:      Vaginal colposcopy:  Normal   Vulvar colposcopy:  Not done, grossly normal  Perirectal colposcopy:  Not done, grossly normal   Specimens: Cervical Bxs at 11 and 4 O'clock  Complications:  None.  Good hemostasis with Silver Nitrate. .  Plan:  Await Bx results   Assessment/Plan:  36 y.o. G3P0120   1. Dysplasia of cervix, high grade CIN 2 Colpo today.  Possible CIN 1 or 2.  Pending cervical Bxs.  Will f/u to discuss results.  2. Uterine leiomyoma, unspecified location Desires conception, known uterine Myomas, not evaluated by [place] x 2015.  F/U Pelvic US to evaluate. - US Transvaginal Non-OB; Future  3. Cervical incompetence H/O Cervical  incompetence.  Will obtain full Medical records and evaluate the cervical length by [place] next visit.  Will discuss with patient with all the information.  Probable referral to Dr Carmela Rima for consideration of Abdominal Cerclage. - US Transvaginal Non-OB; Future  Counseling on above issues >50% x 10 minutes.  Princess Bruins MD, 9:05 AM 06/15/2017

## 2017-06-15 NOTE — Patient Instructions (Signed)
1. Dysplasia of cervix, high grade CIN 2 Colpo today.  Possible CIN 1 or 2.  Pending cervical Bxs.  Will f/u to discuss results.  2. Uterine leiomyoma, unspecified location Desires conception, known uterine Myomas, not evaluated by [place] x 2015.  F/U Pelvic US to evaluate. - US Transvaginal Non-OB; Future  3. Cervical incompetence H/O Cervical incompetence.  Will obtain full Medical records and evaluate the cervical length by [place] next visit.  Will discuss with patient with all the information.  Probable referral to Dr Carmela Rima for consideration of Abdominal Cerclage. - US Transvaginal Non-OB; Future  See you soon [name].

## 2017-06-15 NOTE — Addendum Note (Signed)
Addended by: Thurnell Garbe A on: 06/15/2017 10:02 AM   Modules accepted: Orders

## 2017-06-19 LAB — PATHOLOGY

## 2017-06-20 ENCOUNTER — Ambulatory Visit: Payer: 59 | Admitting: Obstetrics & Gynecology

## 2017-07-12 NOTE — Progress Notes (Signed)
HPI:  Follow up Allergic Rhinitis: -started INS and antihistamine last visit -Reports symptoms resolved on this regimen and she continues to use these  Acute URI: -Started acutely about 5 days ago with subjective fever, nasal congestion, cough and malaise -Overall she is doing better, but has persistent nasal congestion, sinus pressure and cough -Denies: Persistent fever, shortness of breath, thick nasal congestion, ear pain or persistent sinus pain  R ankle pain: -Reports she has had this since she sprained her ankle as a child -We'll get some pain and swelling in the right ankle when she is on her feet for a long time -She uses an ankle brace that somebody gave her at one point -Occasionally she will take an over-the-counter analgesic which helps -Denies weakness, numbness, falls, redness, or pain or swelling of other joints  ROS: See pertinent positives and negatives per HPI.  Past Medical History:  Diagnosis Date  . Hypertension   . Sickle cell trait ([place])     No past surgical history on file.  Family History  Problem Relation Age of Onset  . Hypertension Mother   . Diabetes Maternal Aunt   . Hypertension Maternal Aunt     Social History   Social History  . Marital status: Single    Spouse name: N/A  . Number of children: N/A  . Years of education: N/A   Social History Main Topics  . Smoking status: Never Smoker  . Smokeless tobacco: Never Used  . Alcohol use No  . Drug use: No  . Sexual activity: Yes    Birth control/ protection: Pill   Other Topics Concern  . 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  ibuprofen (ADVIL,MOTRIN) 200 MG tablet, Take 400 mg by mouth every 6 (six) hours as needed for moderate pain., Disp: , Rfl:  .   norethindrone-ethinyl estradiol (BREVICON, 28,) 0.5-35 MG-MCG tablet, Take 1 tablet by mouth at bedtime. , Disp: , Rfl:  .  Prenatal Vit-Fe Fumarate-FA (PRENATAL MULTIVITAMIN) TABS tablet, Take 1 tablet by mouth daily at 12 noon., Disp: , Rfl:  .  benzonatate (TESSALON) 100 MG capsule, Take 1 capsule (100 mg total) by mouth 2 (two) times daily as needed for cough., Disp: 20 capsule, Rfl: 0  EXAM:  Vitals:   07/13/17 1007  BP: 116/82  Pulse: 79  Temp: 98.8 F (37.1 C)    Body mass index is 31.63 kg/m.  GENERAL: vitals reviewed and listed above, alert, oriented, appears well hydrated and in no acute distress  HEENT: atraumatic, conjunttiva clear, no obvious abnormalities on inspection of external nose and ears, normal appearance of ear canals and TMs, clear nasal congestion, mild post oropharyngeal erythema with PND, no tonsillar edema or exudate, no sinus TTP  NECK: no obvious masses on inspection  LUNGS: clear to auscultation bilaterally, no wheezes, rales or rhonchi, good air movement  CV: HRRR, no peripheral edema  MS: moves all extremities without noticeable abnormality, normal inspection of both lower extremities except for minimal edema of the right ankle and mild pes planus, no significant tenderness to palpation at this time, no redness or erythema, negative anterior and posterior drawer testing, negative  talar tilt, normal pedal pulses, neurovascularly intact distally, gait is normal  PSYCH: pleasant and cooperative, no obvious depression or anxiety  ASSESSMENT AND PLAN:  Discussed the following assessment and plan:  Viral upper respiratory illness -Her acute sinus issues and exam are consistent with a viral upper respiratory illness. Discussed symptomatic care options and sent Tessalon for the cough. Did advise follow-up if worsening or symptoms persist longer than expected.  Allergic rhinitis, unspecified seasonality, unspecified trigger -Seems to be doing much better  on an antihistamine and nasal steroid, she did have a minor nosebleed a few days ago so did advise that she hold Flonase for several days if this does occur and did review proper spraying technique.  Chronic pain of right ankle -Trial of compression, elevation and alphabet exercises along with conservative symptomatic care -May have her see Dr. Tamala Julian for consideration of custom orthotics and further evaluation if her symptoms persist, she agrees to contact us in a month with an update if symptoms persist   -Patient advised to return or notify a doctor immediately if symptoms worsen or persist or new concerns arise.  Patient Instructions  Continue your allergy medications.  For the ankle pain: -Try compression socks when you are work - knee-high -Alphabet exercises daily -Elevated legs at the end of a long day -For the pain you can use Tiger balm (topical menthol), Aleve or Tylenol per instructions only if needed -Please let us know if this issue persists and we will place a referral to our sports medicine doctor   INSTRUCTIONS FOR UPPER RESPIRATORY INFECTION:  -plenty of rest and fluids  -nasal saline wash 2-3 times daily (use prepackaged nasal saline or bottled/distilled water if making your own)   -can use AFRIN nasal spray for drainage and nasal congestion - but do NOT use longer then 3-4 days  -can use tylenol (in no history of liver disease) or ibuprofen (if no history of kidney disease, bowel bleeding or significant heart disease) as directed for aches and sorethroat  -in the winter time, using a humidifier at night is helpful (please follow cleaning instructions)  -if you are taking a cough medication - use only as directed, may also try a teaspoon of honey to coat the throat and throat lozenges. I sent a prescription for Tessalon to try for the cough as needed.  -for sore throat, salt water gargles can help  -follow up if you have fevers, facial pain, tooth pain,  difficulty breathing or are worsening or symptoms persist longer then expected  Upper Respiratory Infection, Adult An upper respiratory infection (URI) is also known as the common cold. It is often caused by a type of germ (virus). Colds are easily spread (contagious). You can pass it to others by kissing, coughing, sneezing, or drinking out of the same glass. Usually, you get better in 1 to 3  weeks.  However, the cough can last for even longer. HOME CARE   Only take medicine as told by your doctor. Follow instructions provided above.  Drink enough water and fluids to keep your pee (urine) clear or pale yellow.  Get plenty of rest.  Return to work when your temperature is < 100 for 24 hours or as told by your doctor. You may use a face mask and wash your hands to stop your cold from spreading. GET HELP RIGHT AWAY IF:   After the first few days, you feel you are getting worse.  You have questions about your medicine.  You have  chills, shortness of breath, or red spit (mucus).  You have pain in the face for more then 1-2 days, especially when you bend forward.  You have a fever, puffy (swollen) neck, pain when you swallow, or white spots in the back of your throat.  You have a bad headache, ear pain, sinus pain, or chest pain.  You have a high-pitched whistling sound when you breathe in and out (wheezing).  You cough up blood.  You have sore muscles or a stiff neck. MAKE SURE YOU:   Understand these instructions.  Will watch your condition.  Will get help right away if you are not doing well or get worse. Document Released: 05/15/2008 Document Revised: 02/19/2012 Document Reviewed: 03/04/2014 [facility] Patient Information 2015 [place], [place]. This information is not intended to replace advice given to you by your health care provider. Make sure you discuss any questions you have with your health care provider.    Colin Benton R., DO

## 2017-07-13 ENCOUNTER — Ambulatory Visit (INDEPENDENT_AMBULATORY_CARE_PROVIDER_SITE_OTHER): Payer: 59 | Admitting: Family Medicine

## 2017-07-13 ENCOUNTER — Encounter: Payer: Self-pay | Admitting: Family Medicine

## 2017-07-13 VITALS — BP 116/82 | HR 79 | Temp 98.8°F | Ht 59.0 in | Wt 156.6 lb

## 2017-07-13 DIAGNOSIS — J069 Acute upper respiratory infection, unspecified: Secondary | ICD-10-CM | POA: Diagnosis not present

## 2017-07-13 DIAGNOSIS — J309 Allergic rhinitis, unspecified: Secondary | ICD-10-CM | POA: Diagnosis not present

## 2017-07-13 DIAGNOSIS — M25571 Pain in right ankle and joints of right foot: Secondary | ICD-10-CM

## 2017-07-13 DIAGNOSIS — G8929 Other chronic pain: Secondary | ICD-10-CM | POA: Diagnosis not present

## 2017-07-13 MED ORDER — BENZONATATE 100 MG PO CAPS: 100.0000 mg | ORAL_CAPSULE | Freq: Two times a day (BID) | ORAL | 0 refills | Status: DC | PRN

## 2017-07-13 NOTE — Patient Instructions (Addendum)
Continue your allergy medications.  For the ankle pain: -Try compression socks when you are work - knee-high -Alphabet exercises daily -Elevated legs at the end of a long day -For the pain you can use Tiger balm (topical menthol), Aleve or Tylenol per instructions only if needed -Please let us know if this issue persists and we will place a referral to our sports medicine doctor   INSTRUCTIONS FOR UPPER RESPIRATORY INFECTION:  -plenty of rest and fluids  -nasal saline wash 2-3 times daily (use prepackaged nasal saline or bottled/distilled water if making your own)   -can use AFRIN nasal spray for drainage and nasal congestion - but do NOT use longer then 3-4 days  -can use tylenol (in no history of liver disease) or ibuprofen (if no history of kidney disease, bowel bleeding or significant heart disease) as directed for aches and sorethroat  -in the winter time, using a humidifier at night is helpful (please follow cleaning instructions)  -if you are taking a cough medication - use only as directed, may also try a teaspoon of honey to coat the throat and throat lozenges. I sent a prescription for Tessalon to try for the cough as needed.  -for sore throat, salt water gargles can help  -follow up if you have fevers, facial pain, tooth pain, difficulty breathing or are worsening or symptoms persist longer then expected  Upper Respiratory Infection, Adult An upper respiratory infection (URI) is also known as the common cold. It is often caused by a type of germ (virus). Colds are easily spread (contagious). You can pass it to others by kissing, coughing, sneezing, or drinking out of the same glass. Usually, you get better in 1 to 3  weeks.  However, the cough can last for even longer. HOME CARE   Only take medicine as told by your doctor. Follow instructions provided above.  Drink enough water and fluids to keep your pee (urine) clear or pale yellow.  Get plenty of rest.  Return to  work when your temperature is < 100 for 24 hours or as told by your doctor. You may use a face mask and wash your hands to stop your cold from spreading. GET HELP RIGHT AWAY IF:   After the first few days, you feel you are getting worse.  You have questions about your medicine.  You have chills, shortness of breath, or red spit (mucus).  You have pain in the face for more then 1-2 days, especially when you bend forward.  You have a fever, puffy (swollen) neck, pain when you swallow, or white spots in the back of your throat.  You have a bad headache, ear pain, sinus pain, or chest pain.  You have a high-pitched whistling sound when you breathe in and out (wheezing).  You cough up blood.  You have sore muscles or a stiff neck. MAKE SURE YOU:   Understand these instructions.  Will watch your condition.  Will get help right away if you are not doing well or get worse. Document Released: 05/15/2008 Document Revised: 02/19/2012 Document Reviewed: 03/04/2014 [facility] Patient Information 2015 [place], [place]. This information is not intended to replace advice given to you by your health care provider. Make sure you discuss any questions you have with your health care provider.

## 2017-08-02 ENCOUNTER — Telehealth: Payer: Self-pay | Admitting: Family Medicine

## 2017-08-02 DIAGNOSIS — G8929 Other chronic pain: Secondary | ICD-10-CM

## 2017-08-02 DIAGNOSIS — M25571 Pain in right ankle and joints of right foot: Principal | ICD-10-CM

## 2017-08-02 NOTE — Telephone Encounter (Signed)
Ok to refer to sports med. Please put her appt time requests in referral notes.

## 2017-08-02 NOTE — Telephone Encounter (Signed)
Pt need a referral for her R foot it has not gotten any better from the time she was in on 07/13/17.  If you should have to refer her she would need something that would work with her schedule Every other Thurs after 3:00 starting today 08/02/17 and Every other Wed and Friday off as of today 08/02/17.

## 2017-08-02 NOTE — Telephone Encounter (Signed)
Referral placed and I left a detailed message at the pts cell number that someone will call with appt info.

## 2017-08-06 ENCOUNTER — Ambulatory Visit (INDEPENDENT_AMBULATORY_CARE_PROVIDER_SITE_OTHER): Payer: 59

## 2017-08-06 ENCOUNTER — Encounter: Payer: Self-pay | Admitting: Sports Medicine

## 2017-08-06 ENCOUNTER — Ambulatory Visit (INDEPENDENT_AMBULATORY_CARE_PROVIDER_SITE_OTHER): Payer: 59 | Admitting: Sports Medicine

## 2017-08-06 ENCOUNTER — Encounter (INDEPENDENT_AMBULATORY_CARE_PROVIDER_SITE_OTHER): Payer: Self-pay

## 2017-08-06 VITALS — BP 132/84 | HR 98 | Ht 59.0 in | Wt 159.8 lb

## 2017-08-06 DIAGNOSIS — G8929 Other chronic pain: Secondary | ICD-10-CM | POA: Diagnosis not present

## 2017-08-06 DIAGNOSIS — S99911A Unspecified injury of right ankle, initial encounter: Secondary | ICD-10-CM | POA: Diagnosis not present

## 2017-08-06 DIAGNOSIS — M2141 Flat foot [pes planus] (acquired), right foot: Secondary | ICD-10-CM

## 2017-08-06 DIAGNOSIS — M19071 Primary osteoarthritis, right ankle and foot: Secondary | ICD-10-CM

## 2017-08-06 DIAGNOSIS — M2142 Flat foot [pes planus] (acquired), left foot: Secondary | ICD-10-CM | POA: Diagnosis not present

## 2017-08-06 DIAGNOSIS — M25571 Pain in right ankle and joints of right foot: Secondary | ICD-10-CM

## 2017-08-06 DIAGNOSIS — M25572 Pain in left ankle and joints of left foot: Secondary | ICD-10-CM | POA: Diagnosis not present

## 2017-08-06 MED ORDER — DICLOFENAC SODIUM 2 % TD SOLN: 1.0000 "application " | Freq: Two times a day (BID) | TRANSDERMAL | 0 refills | Status: AC

## 2017-08-06 MED ORDER — DICLOFENAC SODIUM 2 % TD SOLN: 1.0000 "application " | Freq: Two times a day (BID) | TRANSDERMAL | 2 refills | Status: DC

## 2017-08-06 NOTE — Progress Notes (Signed)
OFFICE VISIT NOTE [name]. [name], [place] at [place]  [name] - 36 y.o. female MRN [id number]  Date of birth: 1981/02/03  Visit Date: 08/06/2017  PCP: [name], DO   Referred by: [name], DO  Burlene Arnt, CMA acting as scribe for Dr. Paulla Fore.  SUBJECTIVE:   Chief Complaint  Patient presents with  . New Patient (Initial Visit)    chronic rt ankle pain   HPI: As below and per problem based documentation when appropriate.  [name] is a new patient, referred by Dr. Maudie Mercury, for evaluation of chronic right ankle pain. Her ankle has been bothering her since she sprained her ankle as a child. The pain comes and goes but has gotten worse lately. She recently got new shoes and put a gel heel insert inside the shoe. She has pain on the bottom of both feet and pain on the anterior aspect of the right ankle. Pain is described as shooting and throbbing. She occasionally feels a radiating pain into the right calf. She has noticed intermittent swelling around the ankle. Pain is worse when on her feet al day or when driving. She has some pain when at rest. She has tried Tylenol and Ibuprofen with some relief. She has applied "First Time" cream with some relief. She has soaked the ankle in warm water with Epson salt. She did use ice packs and elevated the rt leg over the weekend. After being on her feet all day pain is rated about 6/10. Pain when relaxing is about 2/10. She says that at times while she is walking she feels that her ankle may give out on her. She does have some tingling in the right foot.     Review of Systems  Constitutional: Negative for chills and fever.  Respiratory: Negative for shortness of breath and wheezing.   Cardiovascular: Negative for chest pain and palpitations.  Musculoskeletal: Positive for joint pain. Negative for falls.  Neurological: Positive for dizziness, tingling and headaches.    Endo/Heme/Allergies: Does not bruise/bleed easily.    Otherwise per HPI.  HISTORY & PERTINENT PRIOR DATA:  No specialty comments available. She reports that she has never smoked. She has never used smokeless tobacco. No results for input(s): HGBA1C, LABURIC in the last 8760 hours. Medications & Allergies reviewed per EMR Patient Active Problem List   Diagnosis Date Noted  . Chronic pain of right ankle 08/06/2017  . Localized osteoarthritis of right ankle 08/06/2017  . UTI (urinary tract infection) 11/25/2014  . Syncope 11/25/2014  . Leiomyoma of uterus 04/13/2014  . IBS (irritable bowel syndrome) 04/13/2014  . Amenorrhea due to Depo Provera 04/13/2014   Past Medical History:  Diagnosis Date  . Hypertension   . Sickle cell trait (HCC)    Family History  Problem Relation Age of Onset  . Hypertension Mother   . Diabetes Maternal Aunt   . Hypertension Maternal Aunt    No past surgical history on file. Social History   Occupational History  . Not on file.   Social History Main Topics  . Smoking status: Never Smoker  . Smokeless tobacco: Never Used  . Alcohol use No  . Drug use: No  . Sexual activity: Yes    Birth control/ protection: Pill    OBJECTIVE:  VS:  HT:4\' 11"  (149.9 cm)   WT:159 lb 12.8 oz (72.5 kg)  BMI:32.26    BP:132/84  HR:98bpm  TEMP: ( )  RESP:98 % EXAM: Findings:  WDWN, NAD, Non-toxic appearing Alert & appropriately interactive Not depressed or anxious appearing No increased work of breathing. Pupils are equal. EOM intact without nystagmus No clubbing or cyanosis of the extremities appreciated No significant rashes/lesions/ulcerations overlying the examined area. DP & PT pulses 2+/4.  No significant pretibial edema. Sensation intact to light touch in lower extremities.  Right ankle: Marked pes planus even at rest with type II posterior tibialis insufficiency on the right and type I on the left.  Overall her forefoot is well maintained with  weightbearing she does have a small amount of hallux valgus bilaterally.  Slight metatarsal pain with metatarsal squeeze test but no appreciable clicking.  Mild pain over the plantar fascia bilaterally.  Ankle dorsiflexion to 95 degrees on the right.  Intrinsic ankle strength is 5+/5.  Ankle drawer testing is stable.     No results found. ASSESSMENT & PLAN:     ICD-10-CM   1. Chronic pain of right ankle M25.571 DG Ankle Complete Right   G89.29   2. Pes planus of both feet M21.41    M21.42   3. Localized osteoarthritis of right ankle M19.071   ================================================================= Localized osteoarthritis of right ankle Mild degenerative changes appreciated on x-ray with breakdown of the medial talus likely from a prior ankle injury.  She is ligamentously stable.  We will have her start topical pen said and have added additional support to her shoes.  She should continue with ankle bracing sleeves/compression sleeves that she already has.  Chronic pain of right ankle This is coming from multifactorial causes including mild ankle osteoarthritis, pes planus and posterior tibialis insufficiency type II on the right and type I on the left.  She would likely benefit from custom cushion orthotics and we discussed the possibility of fabricating these for her at her next office visit.  Medium scaphoid pads were added to her work shoes today to see how this benefits her.  If significant improvement but incomplete we will discuss FASTEC insoles at follow-up.  Could consider smart cell FASTEC special order if she would prefer. =================================================================  Follow-up: Return in about 4 weeks (around 09/03/2017).   CMA/ATC served as Education administrator during this visit. History, Physical, and Plan performed by medical provider. Documentation and orders reviewed and attested to.      Teresa Coombs, [facility] Physician

## 2017-08-06 NOTE — Assessment & Plan Note (Signed)
Mild degenerative changes appreciated on x-ray with breakdown of the medial talus likely from a prior ankle injury.  She is ligamentously stable.  We will have her start topical pen said and have added additional support to her shoes.  She should continue with ankle bracing sleeves/compression sleeves that she already has.

## 2017-08-06 NOTE — Assessment & Plan Note (Signed)
This is coming from multifactorial causes including mild ankle osteoarthritis, pes planus and posterior tibialis insufficiency type II on the right and type I on the left.  She would likely benefit from custom cushion orthotics and we discussed the possibility of fabricating these for her at her next office visit.  Medium scaphoid pads were added to her work shoes today to see how this benefits her.  If significant improvement but incomplete we will discuss FASTEC insoles at follow-up.  Could consider smart cell FASTEC special order if she would prefer.

## 2017-09-07 ENCOUNTER — Ambulatory Visit: Payer: 59 | Admitting: Sports Medicine

## 2017-09-12 ENCOUNTER — Ambulatory Visit (INDEPENDENT_AMBULATORY_CARE_PROVIDER_SITE_OTHER): Payer: 59 | Admitting: Sports Medicine

## 2017-09-12 ENCOUNTER — Encounter: Payer: Self-pay | Admitting: Sports Medicine

## 2017-09-12 VITALS — BP 122/84 | HR 94 | Ht 59.0 in | Wt 156.8 lb

## 2017-09-12 DIAGNOSIS — M2141 Flat foot [pes planus] (acquired), right foot: Secondary | ICD-10-CM

## 2017-09-12 DIAGNOSIS — G8929 Other chronic pain: Secondary | ICD-10-CM | POA: Diagnosis not present

## 2017-09-12 DIAGNOSIS — M25571 Pain in right ankle and joints of right foot: Secondary | ICD-10-CM | POA: Diagnosis not present

## 2017-09-12 DIAGNOSIS — M2142 Flat foot [pes planus] (acquired), left foot: Secondary | ICD-10-CM

## 2017-09-12 MED ORDER — IBUPROFEN-FAMOTIDINE 800-26.6 MG PO TABS: ORAL_TABLET | ORAL | 0 refills | Status: DC

## 2017-09-12 NOTE — Patient Instructions (Addendum)
Look into having your insurance company cover a set of custom orthotics.  The code is L3030 and there are 2 units.  You can call them  and ask if this is covered.  I am happy to do these for you at any time, you just need to let our front office schedulers know you would like an "orthotic appointment."    Get the plantar fasciitis insoles.

## 2017-09-12 NOTE — Progress Notes (Signed)
OFFICE VISIT NOTE [name]. [place], [place] at [place]  RAYVON BRANDVOLD - 36 y.o. female MRN [id number]  Date of birth: [birth year]  Visit Date: 09/12/2017  PCP: [name], DO   Referred by: [name], DO  Burlene Arnt, CMA acting as scribe for Dr. Paulla Fore.  SUBJECTIVE:   Chief Complaint  Patient presents with  . Follow-up    RT ankle pain   HPI: As below and per problem based documentation when appropriate.  [name] is an established patient presenting today in follow-up of osteoarthritis RT ankle. She was last seen 08/06/2017 and had xray of the RT ankle. She was advised to use topical Pennsaid, wear brace/compression sleeve, and scaphoid pads were placed into her work shoes.   She has been using Pennsaid with some relief. She has also been soaking her foot in green alcohol and Epson Salt. She did noticed some swelling in the ankle about 2 days ago. She did have to work over the weekend and when she got home Sunday she noticed that her ankle was swollen. She has been using compression while working but she hasn't used it over the past 2 days because she wanted to see if the swelling would resolve. She does appear to still have some swelling. She has been wearing her shoes with scaphoid pads while working with some relief. She still tends to have a lot of pain after work. Pain is also worse while driving, pushing pedals. She feels that her LT foot it starting to ache d/t overcompensation. She has been taking Aleve, Ibuprofen with some relief. Yesterday while she was standing, her foot almost gave out on her but she did not fall.     Review of Systems  Constitutional: Negative.   HENT: Positive for congestion.   Eyes: Negative.   Respiratory: Positive for cough and wheezing.   Cardiovascular: Negative.   Gastrointestinal: Positive for heartburn.  Genitourinary: Negative.   Musculoskeletal: Positive for  joint pain and myalgias.  Skin: Negative.   Neurological: Negative.     Otherwise per HPI.  HISTORY & PERTINENT PRIOR DATA:  No specialty comments available. She reports that she has never smoked. She has never used smokeless tobacco. No results for input(s): HGBA1C, LABURIC in the last 8760 hours. Allergies reviewed per EMR Prior to Admission medications   Medication Sig Start Date End Date Taking? Authorizing Provider  benzonatate (TESSALON) 100 MG capsule Take 1 capsule (100 mg total) by mouth 2 (two) times daily as needed for cough. 07/13/17  Yes [name], DO  Diclofenac Sodium (PENNSAID) 2 % SOLN Place 1 application onto the skin 2 (two) times daily. 08/06/17  Yes Gerda Diss, DO  ibuprofen (ADVIL,MOTRIN) 200 MG tablet Take 400 mg by mouth every 6 (six) hours as needed for moderate pain.   Yes [provider]  norethindrone-ethinyl estradiol (BREVICON, 28,) 0.5-35 MG-MCG tablet Take 1 tablet by mouth at bedtime.    Yes [provider]  Prenatal Vit-Fe Fumarate-FA (PRENATAL MULTIVITAMIN) TABS tablet Take 1 tablet by mouth daily at 12 noon.   Yes [provider]  Ibuprofen-Famotidine (DUEXIS) 800-26.6 MG TABS 1 tab po tid X 14 days then 1 tab po tid as needed 09/12/17   Gerda Diss, DO   Patient Active Problem List   Diagnosis Date Noted  . Chronic pain of right ankle 08/06/2017  . Localized osteoarthritis of right ankle 08/06/2017  . UTI (  urinary tract infection) 11/25/2014  . Syncope 11/25/2014  . Leiomyoma of uterus 04/13/2014  . IBS (irritable bowel syndrome) 04/13/2014  . Amenorrhea due to Depo Provera 04/13/2014   Past Medical History:  Diagnosis Date  . Hypertension   . Sickle cell trait (HCC)    Family History  Problem Relation Age of Onset  . Hypertension Mother   . Diabetes Maternal Aunt   . Hypertension Maternal Aunt    No past surgical history on file. Social History   Occupational History  . Not on file.   Social  History Main Topics  . Smoking status: Never Smoker  . Smokeless tobacco: Never Used  . Alcohol use No  . Drug use: No  . Sexual activity: Yes    Birth control/ protection: Pill    OBJECTIVE:  VS:  HT:4\' 11"  (149.9 cm)   WT:156 lb 12.8 oz (71.1 kg)  BMI:31.65    BP:122/84  HR:94bpm  TEMP: ( )  RESP:96 % EXAM: Findings:  Right ankle is overall well aligned but she does have a moderate degree of pes planus.  Passive dorsiflexion to 95 degrees on the right which is improved in the past but still remarkably tight.  DP and PT pulses are 2+/4.  No significant pretibial edema.  She does have some onychomycotic changes of all 10 nails but this is minimal.  No significant skin breakdown.    RADIOLOGY: DG Ankle Complete Right CLINICAL DATA:  Right ankle pain after injuring the ankle getting out of her car 1 week ago.  EXAM: RIGHT ANKLE - COMPLETE 3+ VIEW  COMPARISON:  None.  FINDINGS: Mild diffuse soft tissue swelling, most pronounced medially. No fracture, dislocation or effusion. Atheromatous arterial calcifications.  IMPRESSION: No fracture.  Electronically Signed   By: Claudie Revering M.D.   On: 08/06/2017 16:37  ASSESSMENT & PLAN:     ICD-10-CM   1. Chronic pain of right ankle M25.571    G89.29   2. Pes planus of both feet M21.41    M21.42    ================================================================= Chronic pain of right ankle Underlying degenerative changes likely contributing to the pain that she is experiencing.  We will plan to have her obtain insoles OTC as well as use over-the-counter compression socks and compression sleeves on a regular basis.  Work excuse to be able to allow setting while performing her job duties was provided.  We will plan to follow-up as needed if any lack of improvement custom cushion orthotics will be indicated.  No notes on file ================================================================= Patient Instructions  Look into  having your insurance company cover a set of custom orthotics.  The code is L3030 and there are 2 units.  You can call them  and ask if this is covered.  I am happy to do these for you at any time, you just need to let our front office schedulers know you would like an "orthotic appointment."    Get the plantar fasciitis insoles.   =================================================================  Follow-up: Return if symptoms worsen or fail to improve, for For consideration of custom orthotics.   CMA/ATC served as Education administrator during this visit. History, Physical, and Plan performed by medical provider. Documentation and orders reviewed and attested to.      Teresa Coombs, [facility] Physician

## 2017-09-12 NOTE — Assessment & Plan Note (Signed)
Underlying degenerative changes likely contributing to the pain that she is experiencing.  We will plan to have her obtain insoles OTC as well as use over-the-counter compression socks and compression sleeves on a regular basis.  Work excuse to be able to allow setting while performing her job duties was provided.  We will plan to follow-up as needed if any lack of improvement custom cushion orthotics will be indicated.

## 2017-10-19 ENCOUNTER — Encounter: Payer: Self-pay | Admitting: Obstetrics & Gynecology

## 2017-10-19 ENCOUNTER — Ambulatory Visit: Payer: 59 | Admitting: Obstetrics & Gynecology

## 2017-10-19 VITALS — BP 124/78

## 2017-10-19 DIAGNOSIS — N883 Incompetence of cervix uteri: Secondary | ICD-10-CM

## 2017-10-19 DIAGNOSIS — N870 Mild cervical dysplasia: Secondary | ICD-10-CM

## 2017-10-19 DIAGNOSIS — R87610 Atypical squamous cells of undetermined significance on cytologic smear of cervix (ASC-US): Secondary | ICD-10-CM | POA: Diagnosis not present

## 2017-10-19 NOTE — Progress Notes (Signed)
    [name] 1981/11/22 [id number]        36 y.o.  [id number]   RP:  CIN 1 on last Colpo for 4 month repeat Pap  HPI: HPV 16-18-45 neg.  H/O CIN 2, but on last Colpo 06/15/2017 CIN 1 only.  H/O Cervical Incompetence with recurrent pregnancy losts.  [place] Records for review, as she would like to attempt conception again.  Past medical history,surgical history, problem list, medications, allergies, family history and social history were all reviewed and documented in the EPIC chart.  Directed ROS with pertinent positives and negatives documented in the history of present illness/assessment and plan.  Exam:  Vitals:   10/19/17 0950  BP: 124/78   General appearance:  Normal  Gyn exam:  Vulva normal.  Speculum:  Cervix normal/Vagina normal.  Pap/HPV HR done.   Assessment/Plan:  36 y.o. [id number]   1. Dysplasia of cervix, low grade (CIN 1) Repeat Pap with HR HPV done today.  HPV 16-18-45 previously negative.  2. Incompetent cervix Chart from [facility] obtained and reviewed.  11/2000 16 week IUFD post PROM/Probable Cervical Incompetence.  Had Chlam pos in 1st trimester.  04/2002 Fetal loss at 22 3/7 wks.  Chlam pos at 12+ wks.  PTL?  In 2010, had McDonald cerclage done at 12 wks on 06/28/2009/Post Previa.  Failed Cerclage with vaginal bleeding and advanced cervical dilation at 19+ wks, cerclage removed and patient delivered a non-viable female neonate.  Patient will f/u when ready to discuss management of a future pregnancy.  Counseling on above issues >50% x 15 minutes. Princess Bruins MD, 10:06 AM 10/19/2017

## 2017-10-21 NOTE — Patient Instructions (Signed)
1. Dysplasia of cervix, low grade (CIN 1) Repeat Pap with HR HPV done today.  HPV 16-18-45 previously negative.  2. Incompetent cervix Chart from [facility] obtained and reviewed.  11/2000 16 week IUFD post PROM/Probable Cervical Incompetence.  Had Chlam pos in 1st trimester.  04/2002 Fetal loss at 22 3/7 wks.  Chlam pos at 12+ wks.  PTL?  In 2010, had McDonald cerclage done at 12 wks on 06/28/2009/Post Previa.  Failed Cerclage with vaginal bleeding and advanced cervical dilation at 19+ wks, cerclage removed and patient delivered a non-viable female neonate.  Patient will f/u when ready to discuss management of a future pregnancy.  [name], it was a pleasure seeing you today!  I will see you again soon.

## 2017-10-24 LAB — PAP, TP IMAGING W/ HPV RNA, RFLX HPV TYPE 16,18/45: HPV DNA HIGH RISK: DETECTED — AB

## 2017-11-23 ENCOUNTER — Encounter: Payer: Self-pay | Admitting: Family Medicine

## 2017-11-23 ENCOUNTER — Ambulatory Visit (INDEPENDENT_AMBULATORY_CARE_PROVIDER_SITE_OTHER): Payer: 59 | Admitting: Family Medicine

## 2017-11-23 VITALS — BP 118/80 | HR 76 | Temp 98.0°F | Ht 59.25 in | Wt 156.1 lb

## 2017-11-23 DIAGNOSIS — Z6831 Body mass index (BMI) 31.0-31.9, adult: Secondary | ICD-10-CM

## 2017-11-23 DIAGNOSIS — Z0000 Encounter for general adult medical examination without abnormal findings: Secondary | ICD-10-CM

## 2017-11-23 DIAGNOSIS — Z1331 Encounter for screening for depression: Secondary | ICD-10-CM | POA: Diagnosis not present

## 2017-11-23 LAB — LIPID PANEL
CHOL/HDL RATIO: 3
Cholesterol: 169 mg/dL (ref 0–200)
HDL: 56.6 mg/dL (ref >39.00)
LDL CALC: 89 mg/dL (ref 0–99)
NONHDL: 112.47
Triglycerides: 117 mg/dL (ref 0.0–149.0)
VLDL: 23.4 mg/dL (ref 0.0–40.0)

## 2017-11-23 LAB — HEMOGLOBIN A1C: Hgb A1c MFr Bld: 5.8 % (ref 4.6–6.5)

## 2017-11-23 NOTE — Addendum Note (Signed)
Addended by: Elmer Picker on: 11/23/2017 01:52 PM   Modules accepted: Orders

## 2017-11-23 NOTE — Patient Instructions (Signed)
BEFORE YOU LEAVE: -labs -follow up: 6 months for BMI > 30  We have ordered labs or studies at this visit. It can take up to 1-2 weeks for results and processing. IF results require follow up or explanation, we will call you with instructions. Clinically stable results will be released to your [facility]. If you have not heard from [place] or cannot find your results in [facility] in 2 weeks please contact our office at [phone].  If you are not yet signed up for [facility], please consider signing up.   We recommend the following healthy lifestyle for LIFE: 1) Small portions. But, make sure to get regular (at least 3 per day), healthy meals and small healthy snacks if needed.  2) Eat a healthy clean diet.   TRY TO EAT: -at least 5-7 servings of low sugar, colorful, and nutrient rich vegetables per day (not corn, potatoes or bananas.) -berries are the best choice if you wish to eat fruit (only eat small amounts if trying to reduce weight)  -lean meets (fish, white meat of chicken or [place]) -vegan proteins for some meals - beans or tofu, whole grains, nuts and seeds -Replace bad fats with good fats - good fats include: fish, nuts and seeds, canola oil, olive oil -small amounts of low fat or non fat dairy -small amounts of100 % whole grains - check the lables -drink plenty of water  AVOID: -SUGAR, sweets, anything with added sugar, corn syrup or sweeteners - must read labels as even foods advertised as "healthy" often are loaded with sugar -if you must have a sweetener, small amounts of stevia may be best -sweetened beverages and artificially sweetened beverages -simple starches (rice, bread, potatoes, pasta, chips, etc - small amounts of 100% whole grains are ok) -red meat, pork, butter -fried foods, fast food, processed food, excessive dairy, eggs and coconut.  3)Get at least 150 minutes of sweaty aerobic exercise per week.  4)Reduce stress - consider counseling, meditation and relaxation to  balance other aspects of your life.   Health Maintenance, Female Adopting a healthy lifestyle and getting preventive care can go a long way to promote health and wellness. Talk with your health care provider about what schedule of regular examinations is right for you. This is a good chance for you to check in with your provider about disease prevention and staying healthy. In between checkups, there are plenty of things you can do on your own. Experts have done a lot of research about which lifestyle changes and preventive measures are most likely to keep you healthy. Ask your health care provider for more information. Weight and diet Eat a healthy diet  Be sure to include plenty of vegetables, fruits, low-fat dairy products, and lean protein.  Do not eat a lot of foods high in solid fats, added sugars, or salt.  Get regular exercise. This is one of the most important things you can do for your health. ? Most adults should exercise for at least 150 minutes each week. The exercise should increase your heart rate and make you sweat (moderate-intensity exercise). ? Most adults should also do strengthening exercises at least twice a week. This is in addition to the moderate-intensity exercise.  Maintain a healthy weight  Body mass index (BMI) is a measurement that can be used to identify possible weight problems. It estimates body fat based on height and weight. Your health care provider can help determine your BMI and help you achieve or maintain a healthy weight.  For females 58 years of age and older: ? A BMI below 18.5 is considered underweight. ? A BMI of 18.5 to 24.9 is normal. ? A BMI of 25 to 29.9 is considered overweight. ? A BMI of 30 and above is considered obese.  Watch levels of cholesterol and blood lipids  You should start having your blood tested for lipids and cholesterol at 36 years of age, then have this test every 5 years.  You may need to have your cholesterol levels  checked more often if: ? Your lipid or cholesterol levels are high. ? You are older than 36 years of age. ? You are at high risk for heart disease.  Cancer screening Lung Cancer  Lung cancer screening is recommended for adults 75-23 years old who are at high risk for lung cancer because of a history of smoking.  A yearly low-dose CT scan of the lungs is recommended for people who: ? Currently smoke. ? Have quit within the past 15 years. ? Have at least a 30-pack-year history of smoking. A pack year is smoking an average of one pack of cigarettes a day for 1 year.  Yearly screening should continue until it has been 15 years since you quit.  Yearly screening should stop if you develop a health problem that would prevent you from having lung cancer treatment.  Breast Cancer  Practice breast self-awareness. This means understanding how your breasts normally appear and feel.  It also means doing regular breast self-exams. Let your health care provider know about any changes, no matter how small.  If you are in your 20s or 30s, you should have a clinical breast exam (CBE) by a health care provider every 1-3 years as part of a regular health exam.  If you are 52 or older, have a CBE every year. Also consider having a breast X-ray (mammogram) every year.  If you have a family history of breast cancer, talk to your health care provider about genetic screening.  If you are at high risk for breast cancer, talk to your health care provider about having an MRI and a mammogram every year.  Breast cancer gene (BRCA) assessment is recommended for women who have family members with BRCA-related cancers. BRCA-related cancers include: ? Breast. ? Ovarian. ? Tubal. ? Peritoneal cancers.  Results of the assessment will determine the need for genetic counseling and BRCA1 and BRCA2 testing.  Cervical Cancer Your health care provider may recommend that you be screened regularly for cancer of the  pelvic organs (ovaries, uterus, and vagina). This screening involves a pelvic examination, including checking for microscopic changes to the surface of your cervix (Pap test). You may be encouraged to have this screening done every 3 years, beginning at age 52.  For women ages 45-65, health care providers may recommend pelvic exams and Pap testing every 3 years, or they may recommend the Pap and pelvic exam, combined with testing for human papilloma virus (HPV), every 5 years. Some types of HPV increase your risk of cervical cancer. Testing for HPV may also be done on women of any age with unclear Pap test results.  Other health care providers may not recommend any screening for nonpregnant women who are considered low risk for pelvic cancer and who do not have symptoms. Ask your health care provider if a screening pelvic exam is right for you.  If you have had past treatment for cervical cancer or a condition that could lead to cancer, you need Pap  tests and screening for cancer for at least 20 years after your treatment. If Pap tests have been discontinued, your risk factors (such as having a new sexual partner) need to be reassessed to determine if screening should resume. Some women have medical problems that increase the chance of getting cervical cancer. In these cases, your health care provider may recommend more frequent screening and Pap tests.  Colorectal Cancer  This type of cancer can be detected and often prevented.  Routine colorectal cancer screening usually begins at 37 years of age and continues through 37 years of age.  Your health care provider may recommend screening at an earlier age if you have risk factors for colon cancer.  Your health care provider may also recommend using home test kits to check for hidden blood in the stool.  A small camera at the end of a tube can be used to examine your colon directly (sigmoidoscopy or colonoscopy). This is done to check for the  earliest forms of colorectal cancer.  Routine screening usually begins at age 52.  Direct examination of the colon should be repeated every 5-10 years through 36 years of age. However, you may need to be screened more often if early forms of precancerous polyps or small growths are found.  Skin Cancer  Check your skin from head to toe regularly.  Tell your health care provider about any new moles or changes in moles, especially if there is a change in a mole's shape or color.  Also tell your health care provider if you have a mole that is larger than the size of a pencil eraser.  Always use sunscreen. Apply sunscreen liberally and repeatedly throughout the day.  Protect yourself by wearing long sleeves, pants, a wide-brimmed hat, and sunglasses whenever you are outside.  Heart disease, diabetes, and high blood pressure  High blood pressure causes heart disease and increases the risk of stroke. High blood pressure is more likely to develop in: ? People who have blood pressure in the high end of the normal range (130-139/85-89 mm Hg). ? People who are overweight or obese. ? People who are African American.  If you are 84-71 years of age, have your blood pressure checked every 3-5 years. If you are 43 years of age or older, have your blood pressure checked every year. You should have your blood pressure measured twice-once when you are at a hospital or clinic, and once when you are not at a hospital or clinic. Record the average of the two measurements. To check your blood pressure when you are not at a hospital or clinic, you can use: ? An automated blood pressure machine at a pharmacy. ? A home blood pressure monitor.  If you are between 43 years and 84 years old, ask your health care provider if you should take aspirin to prevent strokes.  Have regular diabetes screenings. This involves taking a blood sample to check your fasting blood sugar level. ? If you are at a normal weight and  have a low risk for diabetes, have this test once every three years after 36 years of age. ? If you are overweight and have a high risk for diabetes, consider being tested at a younger age or more often. Preventing infection Hepatitis B  If you have a higher risk for hepatitis B, you should be screened for this virus. You are considered at high risk for hepatitis B if: ? You were born in a country where hepatitis B is  common. Ask your health care provider which countries are considered high risk. ? Your parents were born in a high-risk country, and you have not been immunized against hepatitis B (hepatitis B vaccine). ? You have HIV or AIDS. ? You use needles to inject street drugs. ? You live with someone who has hepatitis B. ? You have had sex with someone who has hepatitis B. ? You get hemodialysis treatment. ? You take certain medicines for conditions, including cancer, organ transplantation, and autoimmune conditions.  Hepatitis C  Blood testing is recommended for: ? Everyone born from [birth year] through 1965. ? Anyone with known risk factors for hepatitis C.  Sexually transmitted infections (STIs)  You should be screened for sexually transmitted infections (STIs) including gonorrhea and chlamydia if: ? You are sexually active and are younger than 36 years of age. ? You are older than 36 years of age and your health care provider tells you that you are at risk for this type of infection. ? Your sexual activity has changed since you were last screened and you are at an increased risk for chlamydia or gonorrhea. Ask your health care provider if you are at risk.  If you do not have HIV, but are at risk, it may be recommended that you take a prescription medicine daily to prevent HIV infection. This is called pre-exposure prophylaxis (PrEP). You are considered at risk if: ? You are sexually active and do not regularly use condoms or know the HIV status of your partner(s). ? You take drugs by  injection. ? You are sexually active with a partner who has HIV.  Talk with your health care provider about whether you are at high risk of being infected with HIV. If you choose to begin PrEP, you should first be tested for HIV. You should then be tested every 3 months for as long as you are taking PrEP. Pregnancy  If you are premenopausal and you may become pregnant, ask your health care provider about preconception counseling.  If you may become pregnant, take 400 to 800 micrograms (mcg) of folic acid every day.  If you want to prevent pregnancy, talk to your health care provider about birth control (contraception). Osteoporosis and menopause  Osteoporosis is a disease in which the bones lose minerals and strength with aging. This can result in serious bone fractures. Your risk for osteoporosis can be identified using a bone density scan.  If you are 66 years of age or older, or if you are at risk for osteoporosis and fractures, ask your health care provider if you should be screened.  Ask your health care provider whether you should take a calcium or vitamin D supplement to lower your risk for osteoporosis.  Menopause may have certain physical symptoms and risks.  Hormone replacement therapy may reduce some of these symptoms and risks. Talk to your health care provider about whether hormone replacement therapy is right for you. Follow these instructions at home:  Schedule regular health, dental, and eye exams.  Stay current with your immunizations.  Do not use any tobacco products including cigarettes, chewing tobacco, or electronic cigarettes.  If you are pregnant, do not drink alcohol.  If you are breastfeeding, limit how much and how often you drink alcohol.  Limit alcohol intake to no more than 1 drink per day for nonpregnant women. One drink equals 12 ounces of beer, 5 ounces of wine, or 1 ounces of hard liquor.  Do not use street drugs.  Do not  share needles.  Ask  your health care provider for help if you need support or information about quitting drugs.  Tell your health care provider if you often feel depressed.  Tell your health care provider if you have ever been abused or do not feel safe at home. This information is not intended to replace advice given to you by your health care provider. Make sure you discuss any questions you have with your health care provider. Document Released: 06/12/2011 Document Revised: 05/04/2016 Document Reviewed: 08/31/2015 [employer] Interactive Patient Education  [employer].

## 2017-11-23 NOTE — Progress Notes (Signed)
HPI:  Here for CPE: Due for labs  -Concerns and/or follow up today: none  -Diet: variety of foods, balance and well rounded, larger portion sizes - lots of sugars/carbs -Exercise: no regular exercise until the last few weeks -Taking folic acid, vitamin D or calcium: no -Diabetes and Dyslipidemia Screening: due for labs -Vaccines: see vaccine section EPIC -pap history: sees gyn for management hx abnormal pap, Dr. Dellis Filbert -FDLMP: see nursing notes -sexual activity: yes, female partner, no new partners -wants STI testing (Hep C if born 34-65): no -FH breast, colon or ovarian ca: see FH Last mammogram: n/a Last colon cancer screening: n/a Breast Ca Risk Assessment: see family history and pt history DEXA (>/= 98): n/a  -Alcohol, Tobacco, drug use: see social history  Review of Systems - no fevers, unintentional weight loss, vision loss, hearing loss, chest pain, sob, hemoptysis, melena, hematochezia, hematuria, genital discharge, changing or concerning skin lesions, bleeding, bruising, loc, thoughts of self harm or SI  Past Medical History:  Diagnosis Date  . Hypertension   . Sickle cell trait ([place])     History reviewed. No pertinent surgical history.  Family History  Problem Relation Age of Onset  . Hypertension Mother   . Diabetes Maternal Aunt   . Hypertension Maternal Aunt     Social History   Socioeconomic History  . Marital status: Single    Spouse name: None  . Number of children: None  . Years of education: None  . Highest education level: None  Social Needs  . Financial resource strain: None  . Food insecurity - worry: None  . Food insecurity - inability: None  . Transportation needs - medical: None  . Transportation needs - non-medical: None  Occupational History  . None  Tobacco Use  . Smoking status: Never Smoker  . Smokeless tobacco: Never Used  Substance and Sexual Activity  . Alcohol use: No  . Drug use: No  . Sexual activity: Yes    Birth  control/protection: Pill  Other Topics Concern  . None  Social History Narrative   Work or School: works as Scientist, water quality at [employer] Situation: lives alone      Spiritual Beliefs:Christian      Lifestyle: no regular CV exercise;  Diet is not great              Current Outpatient Medications:  .  ibuprofen (ADVIL,MOTRIN) 200 MG tablet, Take 400 mg by mouth every 6 (six) hours as needed for moderate pain., Disp: , Rfl:  .  norethindrone-ethinyl estradiol (BREVICON, 28,) 0.5-35 MG-MCG tablet, Take 1 tablet by mouth at bedtime. , Disp: , Rfl:  .  Prenatal Vit-Fe Fumarate-FA (PRENATAL MULTIVITAMIN) TABS tablet, Take 1 tablet by mouth daily at 12 noon., Disp: , Rfl:   EXAM:  Vitals:   11/23/17 1315  BP: 118/80  Pulse: 76  Temp: 98 F (36.7 C)  SpO2: 98%  Body mass index is 31.26 kg/m.   GENERAL: vitals reviewed and listed below, alert, oriented, appears well hydrated and in no acute distress  HEENT: head atraumatic, PERRLA, normal appearance of eyes, ears, nose and mouth. moist mucus membranes.  NECK: supple, no masses or lymphadenopathy  LUNGS: clear to auscultation bilaterally, no rales, rhonchi or wheeze  CV: HRRR, no peripheral edema or cyanosis, normal pedal pulses  ABDOMEN: bowel sounds normal, soft, non tender to palpation, no masses, no rebound or guarding  GU/BREAST: Declined, sees GYN  SKIN: no rash or  abnormal lesions  MS: normal gait, moves all extremities normally  NEURO: normal gait, speech and thought processing grossly intact, muscle tone grossly intact throughout  PSYCH: normal affect, pleasant and cooperative  ASSESSMENT AND PLAN:  Discussed the following assessment and plan:  PREVENTIVE EXAM: -Discussed and advised all [place] preventive services health task force level A and B recommendations for age, sex and risks. -Advised at least 150 minutes of exercise per week and a healthy diet with avoidance of (less then 1 serving per week)  processed foods, white starches, red meat, fast foods and sweets and consisting of: * 5-9 servings of fresh fruits and vegetables (not corn or potatoes) *nuts and seeds, beans *olives and olive oil *lean meats such as fish and white chicken  *whole grains -labs, studies and vaccines per orders this encounter  BMI> 30: -extensive lifestyle counseling  Depression screening: -negative   Patient advised to return to clinic immediately if symptoms worsen or persist or new concerns.  Patient Instructions  BEFORE YOU LEAVE: -labs -follow up: 6 months for BMI > 30  We have ordered labs or studies at this visit. It can take up to 1-2 weeks for results and processing. IF results require follow up or explanation, we will call you with instructions. Clinically stable results will be released to your [facility]. If you have not heard from [place] or cannot find your results in [facility] in 2 weeks please contact our office at [phone].  If you are not yet signed up for [facility], please consider signing up.   We recommend the following healthy lifestyle for LIFE: 1) Small portions. But, make sure to get regular (at least 3 per day), healthy meals and small healthy snacks if needed.  2) Eat a healthy clean diet.   TRY TO EAT: -at least 5-7 servings of low sugar, colorful, and nutrient rich vegetables per day (not corn, potatoes or bananas.) -berries are the best choice if you wish to eat fruit (only eat small amounts if trying to reduce weight)  -lean meets (fish, white meat of chicken or [place]) -vegan proteins for some meals - beans or tofu, whole grains, nuts and seeds -Replace bad fats with good fats - good fats include: fish, nuts and seeds, canola oil, olive oil -small amounts of low fat or non fat dairy -small amounts of100 % whole grains - check the lables -drink plenty of water  AVOID: -SUGAR, sweets, anything with added sugar, corn syrup or sweeteners - must read labels as even foods  advertised as "healthy" often are loaded with sugar -if you must have a sweetener, small amounts of stevia may be best -sweetened beverages and artificially sweetened beverages -simple starches (rice, bread, potatoes, pasta, chips, etc - small amounts of 100% whole grains are ok) -red meat, pork, butter -fried foods, fast food, processed food, excessive dairy, eggs and coconut.  3)Get at least 150 minutes of sweaty aerobic exercise per week.  4)Reduce stress - consider counseling, meditation and relaxation to balance other aspects of your life.   Health Maintenance, Female Adopting a healthy lifestyle and getting preventive care can go a long way to promote health and wellness. Talk with your health care provider about what schedule of regular examinations is right for you. This is a good chance for you to check in with your provider about disease prevention and staying healthy. In between checkups, there are plenty of things you can do on your own. Experts have done a lot of research about which  lifestyle changes and preventive measures are most likely to keep you healthy. Ask your health care provider for more information. Weight and diet Eat a healthy diet  Be sure to include plenty of vegetables, fruits, low-fat dairy products, and lean protein.  Do not eat a lot of foods high in solid fats, added sugars, or salt.  Get regular exercise. This is one of the most important things you can do for your health. ? Most adults should exercise for at least 150 minutes each week. The exercise should increase your heart rate and make you sweat (moderate-intensity exercise). ? Most adults should also do strengthening exercises at least twice a week. This is in addition to the moderate-intensity exercise.  Maintain a healthy weight  Body mass index (BMI) is a measurement that can be used to identify possible weight problems. It estimates body fat based on height and weight. Your health care  provider can help determine your BMI and help you achieve or maintain a healthy weight.  For females 45 years of age and older: ? A BMI below 18.5 is considered underweight. ? A BMI of 18.5 to 24.9 is normal. ? A BMI of 25 to 29.9 is considered overweight. ? A BMI of 30 and above is considered obese.  Watch levels of cholesterol and blood lipids  You should start having your blood tested for lipids and cholesterol at 36 years of age, then have this test every 5 years.  You may need to have your cholesterol levels checked more often if: ? Your lipid or cholesterol levels are high. ? You are older than 36 years of age. ? You are at high risk for heart disease.  Cancer screening Lung Cancer  Lung cancer screening is recommended for adults 76-15 years old who are at high risk for lung cancer because of a history of smoking.  A yearly low-dose CT scan of the lungs is recommended for people who: ? Currently smoke. ? Have quit within the past 15 years. ? Have at least a 30-pack-year history of smoking. A pack year is smoking an average of one pack of cigarettes a day for 1 year.  Yearly screening should continue until it has been 15 years since you quit.  Yearly screening should stop if you develop a health problem that would prevent you from having lung cancer treatment.  Breast Cancer  Practice breast self-awareness. This means understanding how your breasts normally appear and feel.  It also means doing regular breast self-exams. Let your health care provider know about any changes, no matter how small.  If you are in your 20s or 30s, you should have a clinical breast exam (CBE) by a health care provider every 1-3 years as part of a regular health exam.  If you are 41 or older, have a CBE every year. Also consider having a breast X-ray (mammogram) every year.  If you have a family history of breast cancer, talk to your health care provider about genetic screening.  If you are  at high risk for breast cancer, talk to your health care provider about having an MRI and a mammogram every year.  Breast cancer gene (BRCA) assessment is recommended for women who have family members with BRCA-related cancers. BRCA-related cancers include: ? Breast. ? Ovarian. ? Tubal. ? Peritoneal cancers.  Results of the assessment will determine the need for genetic counseling and BRCA1 and BRCA2 testing.  Cervical Cancer Your health care provider may recommend that you be screened regularly  for cancer of the pelvic organs (ovaries, uterus, and vagina). This screening involves a pelvic examination, including checking for microscopic changes to the surface of your cervix (Pap test). You may be encouraged to have this screening done every 3 years, beginning at age 53.  For women ages 35-65, health care providers may recommend pelvic exams and Pap testing every 3 years, or they may recommend the Pap and pelvic exam, combined with testing for human papilloma virus (HPV), every 5 years. Some types of HPV increase your risk of cervical cancer. Testing for HPV may also be done on women of any age with unclear Pap test results.  Other health care providers may not recommend any screening for nonpregnant women who are considered low risk for pelvic cancer and who do not have symptoms. Ask your health care provider if a screening pelvic exam is right for you.  If you have had past treatment for cervical cancer or a condition that could lead to cancer, you need Pap tests and screening for cancer for at least 20 years after your treatment. If Pap tests have been discontinued, your risk factors (such as having a new sexual partner) need to be reassessed to determine if screening should resume. Some women have medical problems that increase the chance of getting cervical cancer. In these cases, your health care provider may recommend more frequent screening and Pap tests.  Colorectal Cancer  This type of  cancer can be detected and often prevented.  Routine colorectal cancer screening usually begins at 36 years of age and continues through 36 years of age.  Your health care provider may recommend screening at an earlier age if you have risk factors for colon cancer.  Your health care provider may also recommend using home test kits to check for hidden blood in the stool.  A small camera at the end of a tube can be used to examine your colon directly (sigmoidoscopy or colonoscopy). This is done to check for the earliest forms of colorectal cancer.  Routine screening usually begins at age 20.  Direct examination of the colon should be repeated every 5-10 years through 36 years of age. However, you may need to be screened more often if early forms of precancerous polyps or small growths are found.  Skin Cancer  Check your skin from head to toe regularly.  Tell your health care provider about any new moles or changes in moles, especially if there is a change in a mole's shape or color.  Also tell your health care provider if you have a mole that is larger than the size of a pencil eraser.  Always use sunscreen. Apply sunscreen liberally and repeatedly throughout the day.  Protect yourself by wearing long sleeves, pants, a wide-brimmed hat, and sunglasses whenever you are outside.  Heart disease, diabetes, and high blood pressure  High blood pressure causes heart disease and increases the risk of stroke. High blood pressure is more likely to develop in: ? People who have blood pressure in the high end of the normal range (130-139/85-89 mm Hg). ? People who are overweight or obese. ? People who are African American.  If you are 36-17 years of age, have your blood pressure checked every 3-5 years. If you are 49 years of age or older, have your blood pressure checked every year. You should have your blood pressure measured twice-once when you are at a hospital or clinic, and once when you are  not at a hospital or clinic.  Record the average of the two measurements. To check your blood pressure when you are not at a hospital or clinic, you can use: ? An automated blood pressure machine at a pharmacy. ? A home blood pressure monitor.  If you are between 78 years and 65 years old, ask your health care provider if you should take aspirin to prevent strokes.  Have regular diabetes screenings. This involves taking a blood sample to check your fasting blood sugar level. ? If you are at a normal weight and have a low risk for diabetes, have this test once every three years after 36 years of age. ? If you are overweight and have a high risk for diabetes, consider being tested at a younger age or more often. Preventing infection Hepatitis B  If you have a higher risk for hepatitis B, you should be screened for this virus. You are considered at high risk for hepatitis B if: ? You were born in a country where hepatitis B is common. Ask your health care provider which countries are considered high risk. ? Your parents were born in a high-risk country, and you have not been immunized against hepatitis B (hepatitis B vaccine). ? You have HIV or AIDS. ? You use needles to inject street drugs. ? You live with someone who has hepatitis B. ? You have had sex with someone who has hepatitis B. ? You get hemodialysis treatment. ? You take certain medicines for conditions, including cancer, organ transplantation, and autoimmune conditions.  Hepatitis C  Blood testing is recommended for: ? Everyone born from [birth year] through 1965. ? Anyone with known risk factors for hepatitis C.  Sexually transmitted infections (STIs)  You should be screened for sexually transmitted infections (STIs) including gonorrhea and chlamydia if: ? You are sexually active and are younger than 36 years of age. ? You are older than 36 years of age and your health care provider tells you that you are at risk for this type of  infection. ? Your sexual activity has changed since you were last screened and you are at an increased risk for chlamydia or gonorrhea. Ask your health care provider if you are at risk.  If you do not have HIV, but are at risk, it may be recommended that you take a prescription medicine daily to prevent HIV infection. This is called pre-exposure prophylaxis (PrEP). You are considered at risk if: ? You are sexually active and do not regularly use condoms or know the HIV status of your partner(s). ? You take drugs by injection. ? You are sexually active with a partner who has HIV.  Talk with your health care provider about whether you are at high risk of being infected with HIV. If you choose to begin PrEP, you should first be tested for HIV. You should then be tested every 3 months for as long as you are taking PrEP. Pregnancy  If you are premenopausal and you may become pregnant, ask your health care provider about preconception counseling.  If you may become pregnant, take 400 to 800 micrograms (mcg) of folic acid every day.  If you want to prevent pregnancy, talk to your health care provider about birth control (contraception). Osteoporosis and menopause  Osteoporosis is a disease in which the bones lose minerals and strength with aging. This can result in serious bone fractures. Your risk for osteoporosis can be identified using a bone density scan.  If you are 6 years of age or older, or if you  are at risk for osteoporosis and fractures, ask your health care provider if you should be screened.  Ask your health care provider whether you should take a calcium or vitamin D supplement to lower your risk for osteoporosis.  Menopause may have certain physical symptoms and risks.  Hormone replacement therapy may reduce some of these symptoms and risks. Talk to your health care provider about whether hormone replacement therapy is right for you. Follow these instructions at home:  Schedule  regular health, dental, and eye exams.  Stay current with your immunizations.  Do not use any tobacco products including cigarettes, chewing tobacco, or electronic cigarettes.  If you are pregnant, do not drink alcohol.  If you are breastfeeding, limit how much and how often you drink alcohol.  Limit alcohol intake to no more than 1 drink per day for nonpregnant women. One drink equals 12 ounces of beer, 5 ounces of wine, or 1 ounces of hard liquor.  Do not use street drugs.  Do not share needles.  Ask your health care provider for help if you need support or information about quitting drugs.  Tell your health care provider if you often feel depressed.  Tell your health care provider if you have ever been abused or do not feel safe at home. This information is not intended to replace advice given to you by your health care provider. Make sure you discuss any questions you have with your health care provider. Document Released: 06/12/2011 Document Revised: 05/04/2016 Document Reviewed: 08/31/2015 Elsevier Interactive Patient Education  2018 [employer].       No Follow-up on file.  Colin Benton R., DO

## 2017-11-28 ENCOUNTER — Telehealth: Payer: Self-pay | Admitting: Family Medicine

## 2017-11-28 NOTE — Telephone Encounter (Signed)
Copied from [place]. Topic: Quick Communication - Office Called Patient >> Nov 27, 2017  4:05 PM Burnis Medin, NT wrote: Reason for CRM: Pt called back and asked to speak to [facility] but no CRM noted.

## 2017-11-30 NOTE — Telephone Encounter (Signed)
Patient informed-see results note. 

## 2017-11-30 NOTE — Telephone Encounter (Signed)
I called the pt and informed her of the hemoglobin A1C result.

## 2017-11-30 NOTE — Telephone Encounter (Signed)
Pt called back states she needs the exact number of her blood sugar.  No one gave to her.

## 2017-12-20 ENCOUNTER — Encounter: Payer: Self-pay | Admitting: Family Medicine

## 2017-12-27 ENCOUNTER — Ambulatory Visit: Payer: 59 | Admitting: Obstetrics & Gynecology

## 2018-01-16 ENCOUNTER — Other Ambulatory Visit: Payer: Self-pay
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
## 2018-01-17 ENCOUNTER — Ambulatory Visit: Payer: 59 | Admitting: Family Medicine

## 2018-01-17 ENCOUNTER — Encounter: Payer: Self-pay | Admitting: Family Medicine

## 2018-01-17 VITALS — BP 122/80 | HR 80 | Temp 98.4°F | Ht 59.5 in | Wt 149.0 lb

## 2018-01-17 DIAGNOSIS — J069 Acute upper respiratory infection, unspecified: Secondary | ICD-10-CM | POA: Diagnosis not present

## 2018-01-17 MED ORDER — BENZONATATE 100 MG PO CAPS: 100.0000 mg | ORAL_CAPSULE | Freq: Two times a day (BID) | ORAL | 0 refills | Status: DC | PRN

## 2018-01-17 MED ORDER — NORETHINDRONE-ETH ESTRADIOL 0.5-35 MG-MCG PO TABS: 1.0000 | ORAL_TABLET | Freq: Every day | ORAL | 2 refills | Status: DC

## 2018-01-17 MED ORDER — ALBUTEROL SULFATE HFA 108 (90 BASE) MCG/ACT IN AERS: 2.0000 | INHALATION_SPRAY | Freq: Four times a day (QID) | RESPIRATORY_TRACT | 0 refills | Status: DC | PRN

## 2018-01-17 NOTE — Progress Notes (Signed)
HPI:  Acute visit for respiratory illness: -started: about 6 days ago -symptoms:nasal congestion, sore throat, cough, occ wheezing - reports hx bonchitis -denies:fever, SOB, NVD, tooth pain, body aches -has tried: nothing -sick contacts/travel/risks: no reported flu, strep or tick exposure  ROS: See pertinent positives and negatives per HPI.  Past Medical History:  Diagnosis Date  . Hypertension   . Sickle cell trait ([place])     History reviewed. No pertinent surgical history.  Family History  Problem Relation Age of Onset  . Hypertension Mother   . Diabetes Maternal Aunt   . Hypertension Maternal Aunt     Social History   Socioeconomic History  . Marital status: Single    Spouse name: None  . Number of children: None  . Years of education: None  . Highest education level: None  Social Needs  . Financial resource strain: None  . Food insecurity - worry: None  . Food insecurity - inability: None  . Transportation needs - medical: None  . Transportation needs - non-medical: None  Occupational History  . None  Tobacco Use  . Smoking status: Never Smoker  . Smokeless tobacco: Never Used  Substance and Sexual Activity  . Alcohol use: No  . Drug use: No  . Sexual activity: Yes    Birth control/protection: Pill  Other Topics Concern  . None  Social History Narrative   Work or School: works as Scientist, water quality at [employer] Situation: lives alone      Spiritual Beliefs:Christian      Lifestyle: no regular CV exercise;  Diet is not great              Current Outpatient Medications:  .  ibuprofen (ADVIL,MOTRIN) 200 MG tablet, Take 400 mg by mouth every 6 (six) hours as needed for moderate pain., Disp: , Rfl:  .  norethindrone-ethinyl estradiol (BREVICON, 28,) 0.5-35 MG-MCG tablet, Take 1 tablet by mouth at bedtime., Disp: 1 Package, Rfl: 2 .  Prenatal Vit-Fe Fumarate-FA (PRENATAL MULTIVITAMIN) TABS tablet, Take 1 tablet by mouth daily at 12 noon., Disp:  , Rfl:  .  albuterol (PROAIR HFA) 108 (90 Base) MCG/ACT inhaler, Inhale 2 puffs into the lungs every 6 (six) hours as needed for wheezing or shortness of breath., Disp: 1 Inhaler, Rfl: 0 .  benzonatate (TESSALON) 100 MG capsule, Take 1 capsule (100 mg total) by mouth 2 (two) times daily as needed for cough., Disp: 20 capsule, Rfl: 0  EXAM:  Vitals:   01/17/18 1605  BP: 122/80  Pulse: 80  Temp: 98.4 F (36.9 C)    Body mass index is 29.59 kg/m.  GENERAL: vitals reviewed and listed above, alert, oriented, appears well hydrated and in no acute distress  HEENT: atraumatic, conjunttiva clear, no obvious abnormalities on inspection of external nose and ears, normal appearance of ear canals and TMs, clear nasal congestion, mild post oropharyngeal erythema with PND, no tonsillar edema or exudate, no sinus TTP  NECK: no obvious masses on inspection  LUNGS: clear to auscultation bilaterally, no wheezes, rales or rhonchi, good air movement  CV: HRRR, no peripheral edema  MS: moves all extremities without noticeable abnormality  PSYCH: pleasant and cooperative, no obvious depression or anxiety  ASSESSMENT AND PLAN:  Discussed the following assessment and plan:  Viral upper respiratory illness  -given HPI and exam findings today, a serious infection or illness is unlikely. We discussed potential etiologies, with VURI being most likely, and advised supportive care  and monitoring. We discussed treatment side effects, likely course, antibiotic misuse, transmission, and signs of developing a serious illness. -? Bronchitis mild - tx with albuterol, tessalon for cough -of course, we advised to return or notify a doctor immediately if symptoms worsen or persist or new concerns arise.    Patient Instructions  INSTRUCTIONS FOR UPPER RESPIRATORY INFECTION:  -albuterol if wheezing per instructions as needed  -plenty of rest and fluids  -nasal saline wash 2-3 times daily (use prepackaged  nasal saline or bottled/distilled water if making your own)   -can use AFRIN nasal spray for drainage and nasal congestion - but do NOT use longer then 3-4 days  -can use tylenol (in no history of liver disease) or ibuprofen (if no history of kidney disease, bowel bleeding or significant heart disease) as directed for aches and sorethroat  -in the winter time, using a humidifier at night is helpful (please follow cleaning instructions)  -if you are taking a cough medication - use only as directed, may also try a teaspoon of honey to coat the throat and throat lozenges. Tessalon for cough per instructions.  -for sore throat, salt water gargles can help  -follow up if you have fevers, facial pain, tooth pain, difficulty breathing or are worsening or symptoms persist longer then expected  Upper Respiratory Infection, Adult An upper respiratory infection (URI) is also known as the common cold. It is often caused by a type of germ (virus). Colds are easily spread (contagious). You can pass it to others by kissing, coughing, sneezing, or drinking out of the same glass. Usually, you get better in 1 to 3  weeks.  However, the cough can last for even longer. HOME CARE   Only take medicine as told by your doctor. Follow instructions provided above.  Drink enough water and fluids to keep your pee (urine) clear or pale yellow.  Get plenty of rest.  Return to work when your temperature is < 100 for 24 hours or as told by your doctor. You may use a face mask and wash your hands to stop your cold from spreading. GET HELP RIGHT AWAY IF:   After the first few days, you feel you are getting worse.  You have questions about your medicine.  You have chills, shortness of breath, or red spit (mucus).  You have pain in the face for more then 1-2 days, especially when you bend forward.  You have a fever, puffy (swollen) neck, pain when you swallow, or white spots in the back of your throat.  You have a  bad headache, ear pain, sinus pain, or chest pain.  You have a high-pitched whistling sound when you breathe in and out (wheezing).  You cough up blood.  You have sore muscles or a stiff neck. MAKE SURE YOU:   Understand these instructions.  Will watch your condition.  Will get help right away if you are not doing well or get worse. Document Released: 05/15/2008 Document Revised: 02/19/2012 Document Reviewed: 03/04/2014 [facility] Patient Information 2015 [place], [place]. This information is not intended to replace advice given to you by your health care provider. Make sure you discuss any questions you have with your health care provider.    [name], DO

## 2018-01-17 NOTE — Patient Instructions (Signed)
INSTRUCTIONS FOR UPPER RESPIRATORY INFECTION:  -albuterol if wheezing per instructions as needed  -plenty of rest and fluids  -nasal saline wash 2-3 times daily (use prepackaged nasal saline or bottled/distilled water if making your own)   -can use AFRIN nasal spray for drainage and nasal congestion - but do NOT use longer then 3-4 days  -can use tylenol (in no history of liver disease) or ibuprofen (if no history of kidney disease, bowel bleeding or significant heart disease) as directed for aches and sorethroat  -in the winter time, using a humidifier at night is helpful (please follow cleaning instructions)  -if you are taking a cough medication - use only as directed, may also try a teaspoon of honey to coat the throat and throat lozenges. Tessalon for cough per instructions.  -for sore throat, salt water gargles can help  -follow up if you have fevers, facial pain, tooth pain, difficulty breathing or are worsening or symptoms persist longer then expected  Upper Respiratory Infection, Adult An upper respiratory infection (URI) is also known as the common cold. It is often caused by a type of germ (virus). Colds are easily spread (contagious). You can pass it to others by kissing, coughing, sneezing, or drinking out of the same glass. Usually, you get better in 1 to 3  weeks.  However, the cough can last for even longer. HOME CARE   Only take medicine as told by your doctor. Follow instructions provided above.  Drink enough water and fluids to keep your pee (urine) clear or pale yellow.  Get plenty of rest.  Return to work when your temperature is < 100 for 24 hours or as told by your doctor. You may use a face mask and wash your hands to stop your cold from spreading. GET HELP RIGHT AWAY IF:   After the first few days, you feel you are getting worse.  You have questions about your medicine.  You have chills, shortness of breath, or red spit (mucus).  You have pain in the  face for more then 1-2 days, especially when you bend forward.  You have a fever, puffy (swollen) neck, pain when you swallow, or white spots in the back of your throat.  You have a bad headache, ear pain, sinus pain, or chest pain.  You have a high-pitched whistling sound when you breathe in and out (wheezing).  You cough up blood.  You have sore muscles or a stiff neck. MAKE SURE YOU:   Understand these instructions.  Will watch your condition.  Will get help right away if you are not doing well or get worse. Document Released: 05/15/2008 Document Revised: 02/19/2012 Document Reviewed: 03/04/2014 [facility] Patient Information 2015 [place], [place]. This information is not intended to replace advice given to you by your health care provider. Make sure you discuss any questions you have with your health care provider.

## 2018-02-20 ENCOUNTER — Ambulatory Visit: Payer: 59 | Admitting: Obstetrics & Gynecology

## 2018-02-20 ENCOUNTER — Encounter: Payer: Self-pay | Admitting: Obstetrics & Gynecology

## 2018-02-20 VITALS — BP 124/78

## 2018-02-20 DIAGNOSIS — N870 Mild cervical dysplasia: Secondary | ICD-10-CM | POA: Diagnosis not present

## 2018-02-20 DIAGNOSIS — Z3041 Encounter for surveillance of contraceptive pills: Secondary | ICD-10-CM

## 2018-02-20 MED ORDER — NORETHINDRONE-ETH ESTRADIOL 0.5-35 MG-MCG PO TABS: 1.0000 | ORAL_TABLET | Freq: Every day | ORAL | 3 refills | Status: DC

## 2018-02-20 NOTE — Progress Notes (Signed)
    [place] Nov 07, 1981 [id number]        37 y.o.  G3P0120 Stable boyfriend  RP: H/O CIN 1, last Pap ASCUS/HPV HR pos for f/u Colposcopy  HPI: No pelvic pain, no abnormal vaginal d/c.  Declines STI screen.   OB History  Gravida Para Term Preterm AB Living  3 1 0 1 2 0  SAB TAB Ectopic Multiple Live Births  2            # Outcome Date GA Lbr Len/2nd Weight Sex Delivery Anes PTL Lv  3 Preterm 2010 [redacted]w[redacted]d   M Vag-Spont        Birth Comments: cerclage, placenta previa  2 SAB 2003 [redacted]w[redacted]d   F      1 SAB 2001 [redacted]w[redacted]d   M          Past medical history,surgical history, problem list, medications, allergies, family history and social history were all reviewed and documented in the EPIC chart.   Directed ROS with pertinent positives and negatives documented in the history of present illness/assessment and plan.  Exam:  There were no vitals filed for this visit. General appearance:  Normal  Colposcopy Procedure Note CHERRIL HETT 02/20/2018  Indications:  H/O CIN 1, last pap ASCUS/HPV HR pos  Procedure Details  The risks and benefits of the procedure and Verbal informed consent obtained.  Speculum placed in vagina and excellent visualization of cervix achieved, cervix swabbed x 3 with acetic acid solution.  Findings:  Cervix colposcopy: Physical Exam  Genitourinary:      Vaginal colposcopy: Normal  Vulvar colposcopy: Grossly normal  Perirectal colposcopy: Grossly normal  The cervix was sprayed with Hurricane before performing the cervical biopsies.  Specimens: Cervical Bxs at 10 and 2 O'clock.  Complications: None, good hemostasis with Silver Nitrate . Plan:  Per Bx resulsts   Assessment/Plan:  37 y.o. G3P0120   1. Dysplasia of cervix, low grade (CIN 1) Colposcopy of cervix with Bxs.  Procedure and findings reviewed with the patient.  Management per results.  2. Encounter for surveillance of contraceptive pills Well on birth control pill  with Brevicon.  No contraindication to birth control pills.  Represcribed until time of annual gynecologic exam.  Other orders - norethindrone-ethinyl estradiol (BREVICON, 28,) 0.5-35 MG-MCG tablet; Take 1 tablet by mouth at bedtime.  Princess Bruins MD, 4:17 PM 02/20/2018

## 2018-02-22 LAB — TISSUE PATH REPORT

## 2018-02-22 LAB — PATHOLOGY

## 2018-02-25 ENCOUNTER — Encounter: Payer: Self-pay | Admitting: Obstetrics & Gynecology

## 2018-02-25 NOTE — Patient Instructions (Signed)
1. Dysplasia of cervix, low grade (CIN 1) Colposcopy of cervix with Bxs.  Procedure and findings reviewed with the patient.  Management per results.  2. Encounter for surveillance of contraceptive pills Well on birth control pill with Brevicon.  No contraindication to birth control pills.  Represcribed until time of annual gynecologic exam.  Other orders - norethindrone-ethinyl estradiol (BREVICON, 28,) 0.5-35 MG-MCG tablet; Take 1 tablet by mouth at bedtime.  [name], good seeing you today!  I will inform you of your results as soon as they are available.   Colposcopy, Care After This sheet gives you information about how to care for yourself after your procedure. Your health care provider may also give you more specific instructions. If you have problems or questions, contact your health care provider. What can I expect after the procedure? If you had a colposcopy without a biopsy, you can expect to feel fine right away, but you may have some spotting for a few days. You can go back to your normal activities. If you had a colposcopy with a biopsy, it is common to have:  Soreness and pain. This may last for a few days.  Light-headedness.  Mild vaginal bleeding or dark-colored, grainy discharge. This may last for a few days. The discharge may be due to a solution that was used during the procedure. You may need to wear a sanitary pad during this time.  Spotting for at least 48 hours after the procedure.  Follow these instructions at home:  Take over-the-counter and prescription medicines only as told by your health care provider. Talk with your health care provider about what type of over-the-counter pain medicine and prescription medicine you can start taking again. It is especially important to talk with your health care provider if you take blood-thinning medicine.  Do not drive or use heavy machinery while taking prescription pain medicine.  For at least 3 days after your  procedure, or as long as told by your health care provider, avoid: ? Douching. ? Using tampons. ? Having sexual intercourse.  Continue to use birth control (contraception).  Limit your physical activity for the first day after the procedure as told by your health care provider. Ask your health care provider what activities are safe for you.  It is up to you to get the results of your procedure. Ask your health care provider, or the department performing the procedure, when your results will be ready.  Keep all follow-up visits as told by your health care provider. This is important. Contact a health care provider if:  You develop a skin rash. Get help right away if:  You are bleeding heavily from your vagina or you are passing blood clots. This includes using more than one sanitary pad per hour for 2 hours in a row.  You have a fever or chills.  You have pelvic pain.  You have abnormal, yellow-colored, or bad-smelling vaginal discharge. This could be a sign of infection.  You have severe pain or cramps in your lower abdomen that do not get better with medicine.  You feel light-headed or dizzy, or you faint. Summary  If you had a colposcopy without a biopsy, you can expect to feel fine right away, but you may have some spotting for a few days. You can go back to your normal activities.  If you had a colposcopy with a biopsy, you may notice mild pain and spotting for 48 hours after the procedure.  Avoid douching, using tampons, and having  sexual intercourse for 3 days after the procedure or as long as told by your health care provider.  Contact your health care provider if you have bleeding, severe pain, or signs of infection. This information is not intended to replace advice given to you by your health care provider. Make sure you discuss any questions you have with your health care provider. Document Released: 09/17/2013 Document Revised: 07/14/2016 Document Reviewed:  07/14/2016 Elsevier Interactive Patient Education  2018 [employer].

## 2018-02-27 NOTE — Addendum Note (Signed)
Addended by: Alen Blew on: 02/27/2018 04:14 PM   Modules accepted: Level of Service
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
## 2018-05-28 ENCOUNTER — Ambulatory Visit: Payer: 59 | Admitting: Family Medicine

## 2018-05-28 ENCOUNTER — Encounter: Payer: Self-pay | Admitting: Family Medicine

## 2018-05-28 VITALS — BP 118/78 | HR 84 | Temp 99.0°F | Wt 148.4 lb

## 2018-05-28 DIAGNOSIS — J302 Other seasonal allergic rhinitis: Secondary | ICD-10-CM | POA: Diagnosis not present

## 2018-05-28 DIAGNOSIS — R030 Elevated blood-pressure reading, without diagnosis of hypertension: Secondary | ICD-10-CM

## 2018-05-28 DIAGNOSIS — Z6829 Body mass index (BMI) 29.0-29.9, adult: Secondary | ICD-10-CM

## 2018-05-28 DIAGNOSIS — R739 Hyperglycemia, unspecified: Secondary | ICD-10-CM

## 2018-05-28 LAB — HEMOGLOBIN A1C: HEMOGLOBIN A1C: 5.7 % (ref 4.6–6.5)

## 2018-05-28 NOTE — Patient Instructions (Signed)
BEFORE YOU LEAVE: -lab -follow up:3 months  We have ordered labs or studies at this visit. It can take up to 1-2 weeks for results and processing. IF results require follow up or explanation, we will call you with instructions. Clinically stable results will be released to your [facility]. If you have not heard from [place] or cannot find your results in [facility] in 2 weeks please contact our office at [phone].  If you are not yet signed up for [facility], please consider signing up.   We recommend the following healthy lifestyle for LIFE: 1) Small portions. But, make sure to get regular (at least 3 per day), healthy meals and small healthy snacks if needed.  2) Eat a healthy clean diet.   TRY TO EAT: -at least 5-7 servings of low sugar, colorful, and nutrient rich vegetables per day (not corn, potatoes or bananas.) -berries are the best choice if you wish to eat fruit (only eat small amounts if trying to reduce weight)  -lean meets (fish, white meat of chicken or [place]) -vegan proteins for some meals - beans or tofu, whole grains, nuts and seeds -Replace bad fats with good fats - good fats include: fish, nuts and seeds, canola oil, olive oil -small amounts of low fat or non fat dairy -small amounts of100 % whole grains - check the lables -drink plenty of water  AVOID: -SUGAR, sweets, anything with added sugar, corn syrup or sweeteners - must read labels as even foods advertised as "healthy" often are loaded with sugar -if you must have a sweetener, small amounts of stevia may be best -sweetened beverages and artificially sweetened beverages -simple starches (rice, bread, potatoes, pasta, chips, etc - small amounts of 100% whole grains are ok) -red meat, pork, butter -fried foods, fast food, processed food, excessive dairy, eggs and coconut.  3)Get at least 150 minutes of sweaty aerobic exercise per week.  4)Reduce stress - consider counseling, meditation and relaxation to balance other  aspects of your life.

## 2018-05-29 ENCOUNTER — Encounter: Payer: Self-pay | Admitting: Obstetrics & Gynecology

## 2018-05-29 ENCOUNTER — Ambulatory Visit (INDEPENDENT_AMBULATORY_CARE_PROVIDER_SITE_OTHER): Payer: 59 | Admitting: Obstetrics & Gynecology

## 2018-05-29 VITALS — BP 128/84 | Ht 59.5 in | Wt 150.0 lb

## 2018-05-29 DIAGNOSIS — Z01419 Encounter for gynecological examination (general) (routine) without abnormal findings: Secondary | ICD-10-CM

## 2018-05-29 DIAGNOSIS — R87610 Atypical squamous cells of undetermined significance on cytologic smear of cervix (ASC-US): Secondary | ICD-10-CM | POA: Diagnosis not present

## 2018-05-29 DIAGNOSIS — Z1151 Encounter for screening for human papillomavirus (HPV): Secondary | ICD-10-CM | POA: Diagnosis not present

## 2018-05-29 DIAGNOSIS — N870 Mild cervical dysplasia: Secondary | ICD-10-CM

## 2018-05-29 DIAGNOSIS — Z113 Encounter for screening for infections with a predominantly sexual mode of transmission: Secondary | ICD-10-CM | POA: Diagnosis not present

## 2018-05-29 DIAGNOSIS — Z3041 Encounter for surveillance of contraceptive pills: Secondary | ICD-10-CM | POA: Diagnosis not present

## 2018-05-29 MED ORDER — NORETHINDRONE-ETH ESTRADIOL 0.5-35 MG-MCG PO TABS: 1.0000 | ORAL_TABLET | Freq: Every day | ORAL | 4 refills | Status: DC

## 2018-05-29 NOTE — Progress Notes (Signed)
[name] 1981/02/15 [id number]   History:    37 y.o. G3P2A1L0  Stable boyfriend  RP:  Established patient presenting for annual gyn exam   HPI: Well on Brevicon.  No BTB.  No pelvic pain.  Known small Uterine Fibroids.  Normal vaginal secretions.  No pain with IC.  CIN 1 on Colpo Cervical Bx 02/2018.  HPV HR pos.  Breasts normal.  BMI 29.79.  H/O 3 spontaneous pregnancy losses at 16 wks, 20+ wks and 20-21 wks.  Cervical incompetence?  HBA1C 5.7 on 05/28/2018 with Fam MD.  Past medical history,surgical history, family history and social history were all reviewed and documented in the EPIC chart.  Gynecologic History No LMP recorded. (Menstrual status: Oral contraceptives). Contraception: OCP (estrogen/progesterone) Last Pap: 10/2017. Results were: ASCUS/HPV HR pos.  Last Colpo 02/2018 CIN 1. Last mammogram: Never Bone Density: Never Colonoscopy: Never  Obstetric History OB History  Gravida Para Term Preterm AB Living  3 1 0 1 2 0  SAB TAB Ectopic Multiple Live Births  2            # Outcome Date GA Lbr Len/2nd Weight Sex Delivery Anes PTL Lv  3 Preterm 2010 [redacted]w[redacted]d   M Vag-Spont        Birth Comments: cerclage, placenta previa  2 SAB 2003 [redacted]w[redacted]d   F      1 SAB 2001 [redacted]w[redacted]d   M         ROS: A ROS was performed and pertinent positives and negatives are included in the history.  GENERAL: No fevers or chills. HEENT: No change in vision, no earache, sore throat or sinus congestion. NECK: No pain or stiffness. CARDIOVASCULAR: No chest pain or pressure. No palpitations. PULMONARY: No shortness of breath, cough or wheeze. GASTROINTESTINAL: No abdominal pain, nausea, vomiting or diarrhea, melena or bright red blood per rectum. GENITOURINARY: No urinary frequency, urgency, hesitancy or dysuria. MUSCULOSKELETAL: No joint or muscle pain, no back pain, no recent trauma. DERMATOLOGIC: No rash, no itching, no lesions. ENDOCRINE: No polyuria, polydipsia, no heat or cold intolerance. No  recent change in weight. HEMATOLOGICAL: No anemia or easy bruising or bleeding. NEUROLOGIC: No headache, seizures, numbness, tingling or weakness. PSYCHIATRIC: No depression, no loss of interest in normal activity or change in sleep pattern.     Exam:   BP 128/84   Ht 4' 11.5" (1.511 m)   Wt 150 lb (68 kg)   BMI 29.79 kg/m   Body mass index is 29.79 kg/m.  General appearance : Well developed well nourished female. No acute distress HEENT: Eyes: no retinal hemorrhage or exudates,  Neck supple, trachea midline, no carotid bruits, no thyroidmegaly Lungs: Clear to auscultation, no rhonchi or wheezes, or rib retractions  Heart: Regular rate and rhythm, no murmurs or gallops Breast:Examined in sitting and supine position were symmetrical in appearance, no palpable masses or tenderness,  no skin retraction, no nipple inversion, no nipple discharge, no skin discoloration, no axillary or supraclavicular lymphadenopathy Abdomen: no palpable masses or tenderness, no rebound or guarding Extremities: no edema or skin discoloration or tenderness  Pelvic: Vulva: Normal             Vagina: No gross lesions or discharge  Cervix: No gross lesions or discharge.  Pap/HPV HR/Gono-Chlam done.  Uterus  AV, mildly increased in size, nodular, c/w small fibroids, non-tender and mobile  Adnexa  Without masses or tenderness  Anus: Normal   Assessment/Plan:  37 y.o. female for annual exam  1. Encounter for routine gynecological examination with Papanicolaou smear of cervix Gynecologic exam with stable asymptomatic uterine fibroids.  Pap with high-risk HPV done today.  Breast exam normal.  Health labs with family physician.  Hemoglobin A1c normal at 5.7 yesterday.  Continue with regular physical activity, recommend aerobic 5 times a week and weight lifting every 2 days.  Low calorie/carb diet recommended.  2. Encounter for surveillance of contraceptive pills Well on norethindrone ethinyl estradiol 0.5/35.   No contraindication to birth control pill.  Same birth control pill represcribed.  3. Dysplasia of cervix, low grade (CIN 1) Last colposcopy with CIN-1.  High risk HPV present.  Pap with high-risk HPV done today.  Management per results.  If Pap test negative, will repeat Pap in 6 months.  4. Screen for STD (sexually transmitted disease) Gonorrhea and Chlamydia done on the Pap test.  Other orders - norethindrone-ethinyl estradiol (BREVICON, 28,) 0.5-35 MG-MCG tablet; Take 1 tablet by mouth at bedtime.  Princess Bruins MD, 3:06 PM 05/29/2018

## 2018-05-29 NOTE — Addendum Note (Signed)
Addended by: Thurnell Garbe A on: 05/29/2018 04:16 PM   Modules accepted: Orders

## 2018-05-29 NOTE — Patient Instructions (Addendum)
1. Encounter for routine gynecological examination with Papanicolaou smear of cervix Gynecologic exam with stable asymptomatic uterine fibroids.  Pap with high-risk HPV done today.  Breast exam normal.  Health labs with family physician.  Hemoglobin A1c normal at 5.7 yesterday.  Continue with regular physical activity, recommend aerobic 5 times a week and weight lifting every 2 days.  Low calorie/carb diet recommended.  2. Encounter for surveillance of contraceptive pills Well on norethindrone ethinyl estradiol 0.5/35.  No contraindication to birth control pill.  Same birth control pill represcribed.  3. Dysplasia of cervix, low grade (CIN 1) Last colposcopy with CIN-1.  High risk HPV present.  Pap with high-risk HPV done today.  Management per results.  If Pap test negative, will repeat Pap in 6 months.  4. Screen for STD (sexually transmitted disease) Gonorrhea and Chlamydia done on the Pap test.  Other orders - norethindrone-ethinyl estradiol (BREVICON, 28,) 0.5-35 MG-MCG tablet; Take 1 tablet by mouth at bedtime.  [name], it was a pleasure seeing you today!  I will inform you of your results as soon as they are available.

## 2018-06-03 LAB — PAP IG, CT-NG NAA, HPV HIGH-RISK
C. TRACHOMATIS RNA, TMA: NOT DETECTED
HPV DNA HIGH RISK: DETECTED — AB
N. GONORRHOEAE RNA, TMA: NOT DETECTED

## 2018-06-03 LAB — HPV TYPE 16 AND 18/45 RNA
HPV TYPE 18/45 RNA: NOT DETECTED
HPV Type 16 RNA: NOT DETECTED

## 2018-06-20 ENCOUNTER — Ambulatory Visit: Payer: 59 | Admitting: Family Medicine

## 2018-06-20 ENCOUNTER — Encounter: Payer: Self-pay | Admitting: Family Medicine

## 2018-06-20 VITALS — BP 110/80 | HR 88 | Temp 98.3°F | Ht 59.5 in | Wt 153.3 lb

## 2018-06-20 DIAGNOSIS — J0101 Acute recurrent maxillary sinusitis: Secondary | ICD-10-CM

## 2018-06-20 MED ORDER — AMOXICILLIN-POT CLAVULANATE 875-125 MG PO TABS: 1.0000 | ORAL_TABLET | Freq: Two times a day (BID) | ORAL | 0 refills | Status: DC

## 2018-06-20 NOTE — Progress Notes (Signed)
HPI:  Using dictation device. Unfortunately this device frequently misinterprets words/phrases.   Acute visit for respiratory illness: -started: 2-3 weeks ago and worsening -symptoms:nasal congestion, pnd, L maxillary sinus pain - worse with leaning forward -denies:fever, SOB, NVD, wheezing, excessive cough -has tried: claritin -sick contacts/travel/risks: no reported flu, strep or tick exposure -Hx of: sinusitis last month dx with her dentist during eval for root canal and given amoxicillin, reports symptoms resolved then started back up again, has appt with dentist coming up to recheck teeth -did not have any issues with the dental work  ROS: See pertinent positives and negatives per HPI.  Past Medical History:  Diagnosis Date  . Hypertension   . Sickle cell trait ([place])     History reviewed. No pertinent surgical history.  Family History  Problem Relation Age of Onset  . Hypertension Mother   . Diabetes Maternal Aunt   . Hypertension Maternal Aunt     Social History   Socioeconomic History  . Marital status: Single    Spouse name: Not on file  . Number of children: Not on file  . Years of education: Not on file  . Highest education level: Not on file  Occupational History  . Not on file  Social Needs  . Financial resource strain: Not on file  . Food insecurity:    Worry: Not on file    Inability: Not on file  . Transportation needs:    Medical: Not on file    Non-medical: Not on file  Tobacco Use  . Smoking status: Never Smoker  . Smokeless tobacco: Never Used  Substance and Sexual Activity  . Alcohol use: No  . Drug use: No  . Sexual activity: Yes    Partners: Male    Birth control/protection: Pill    Comment: 1st intercourse- 17, partners- 48, current partner-  5 yrs  Lifestyle  . Physical activity:    Days per week: Not on file    Minutes per session: Not on file  . Stress: Not on file  Relationships  . Social connections:    Talks on phone:  Not on file    Gets together: Not on file    Attends religious service: Not on file    Active member of club or organization: Not on file    Attends meetings of clubs or organizations: Not on file    Relationship status: Not on file  Other Topics Concern  . Not on file  Social History Narrative   Work or School: works as Scientist, water quality at [employer] Situation: lives alone      Spiritual Beliefs:Christian      Lifestyle: no regular CV exercise;  Diet is not great              Current Outpatient Medications:  .  albuterol (PROAIR HFA) 108 (90 Base) MCG/ACT inhaler, Inhale 2 puffs into the lungs every 6 (six) hours as needed for wheezing or shortness of breath., Disp: 1 Inhaler, Rfl: 0 .  ibuprofen (ADVIL,MOTRIN) 200 MG tablet, Take 400 mg by mouth every 6 (six) hours as needed for moderate pain., Disp: , Rfl:  .  norethindrone-ethinyl estradiol (BREVICON, 28,) 0.5-35 MG-MCG tablet, Take 1 tablet by mouth at bedtime., Disp: 3 Package, Rfl: 4 .  Prenatal Vit-Fe Fumarate-FA (PRENATAL MULTIVITAMIN) TABS tablet, Take 1 tablet by mouth daily at 12 noon., Disp: , Rfl:  .  amoxicillin-clavulanate (AUGMENTIN) 875-125 MG tablet, Take 1 tablet by  mouth 2 (two) times daily., Disp: 20 tablet, Rfl: 0  EXAM:  Vitals:   06/20/18 1530  BP: 110/80  Pulse: 88  Temp: 98.3 F (36.8 C)  SpO2: 98%    Body mass index is 30.44 kg/m.  GENERAL: vitals reviewed and listed above, alert, oriented, appears well hydrated and in no acute distress  HEENT: atraumatic, conjunttiva clear, no obvious abnormalities on inspection of external nose and ears, normal appearance of ear canals and TMs, thick nasal congestion, mild post oropharyngeal erythema with PND, no tonsillar edema or exudate, L max sinus TTP  NECK: no obvious masses on inspection  LUNGS: clear to auscultation bilaterally, no wheezes, rales or rhonchi, good air movement  CV: HRRR, no peripheral edema  MS: moves all extremities without  noticeable abnormality  PSYCH: pleasant and cooperative, no obvious depression or anxiety  ASSESSMENT AND PLAN:  Discussed the following assessment and plan:  Acute recurrent maxillary sinusitis - Plan: amoxicillin-clavulanate (AUGMENTIN) 875-125 MG tablet  - We discussed potential etiologies, with sinusitis being most likely. We discussed treatment side effects, likely course, antibiotic risks/benefit, potential complications and signs of developing a serious illness. -advise ENT eval if worsening, persists or recurs and # provided to call -has follow up with dentist to recheck -f/u here as needed    Patient Instructions  Take the antibiotic as prescribed.  Please follow up with your dentist as planned.  Please see the Ear, Nose and Throat specialist if symptoms worse, persist or recur.  I hope you are feeling better soon! Seek care promptly if your symptoms worsen, new concerns arise or you are not improving with treatment.     [name], DO

## 2018-06-20 NOTE — Patient Instructions (Signed)
Take the antibiotic as prescribed.  Please follow up with your dentist as planned.  Please see the Ear, Nose and Throat specialist if symptoms worse, persist or recur.  I hope you are feeling better soon! Seek care promptly if your symptoms worsen, new concerns arise or you are not improving with treatment.

## 2018-07-18 ENCOUNTER — Encounter: Payer: Self-pay | Admitting: Family Medicine

## 2018-07-18 ENCOUNTER — Ambulatory Visit: Payer: 59 | Admitting: Family Medicine

## 2018-07-18 VITALS — BP 132/80 | HR 83 | Temp 98.5°F | Ht 59.5 in | Wt 152.1 lb

## 2018-07-18 DIAGNOSIS — I10 Essential (primary) hypertension: Secondary | ICD-10-CM | POA: Diagnosis not present

## 2018-07-18 DIAGNOSIS — R739 Hyperglycemia, unspecified: Secondary | ICD-10-CM

## 2018-07-18 DIAGNOSIS — E669 Obesity, unspecified: Secondary | ICD-10-CM

## 2018-07-18 MED ORDER — AMLODIPINE BESYLATE 2.5 MG PO TABS: 2.5000 mg | ORAL_TABLET | Freq: Every day | ORAL | 3 refills | Status: DC

## 2018-07-18 NOTE — Patient Instructions (Signed)
BEFORE YOU LEAVE: -lab to check kidneys -follow up: as scheduled in September  Start the norvasc and take once daily, 2.5mg   Eat a healthy low sugar diet and get regular exercise  Seek care promptly if new concerns arise or you are not improving with treatment.  We have ordered labs or studies at this visit. It can take up to 1-2 weeks for results and processing. IF results require follow up or explanation, we will call you with instructions. Clinically stable results will be released to your [facility]. If you have not heard from [place] or cannot find your results in [facility] in 2 weeks please contact our office at [phone].  If you are not yet signed up for [facility], please consider signing up.   We recommend the following healthy lifestyle for LIFE: 1) Small portions. But, make sure to get regular (at least 3 per day), healthy meals and small healthy snacks if needed.  2) Eat a healthy clean diet.   TRY TO EAT: -at least 5-7 servings of low sugar, colorful, and nutrient rich vegetables per day (not corn, potatoes or bananas.) -berries are the best choice if you wish to eat fruit (only eat small amounts if trying to reduce weight)  -lean meets (fish, white meat of chicken or [place]) -vegan proteins for some meals - beans or tofu, whole grains, nuts and seeds -Replace bad fats with good fats - good fats include: fish, nuts and seeds, canola oil, olive oil -small amounts of low fat or non fat dairy -small amounts of100 % whole grains - check the lables -drink plenty of water  AVOID: -SUGAR, sweets, anything with added sugar, corn syrup or sweeteners - must read labels as even foods advertised as "healthy" often are loaded with sugar -if you must have a sweetener, small amounts of stevia may be best -sweetened beverages and artificially sweetened beverages -simple starches (rice, bread, potatoes, pasta, chips, etc - small amounts of 100% whole grains are ok) -red meat, pork,  butter -fried foods, fast food, processed food, excessive dairy, eggs and coconut.  3)Get at least 150 minutes of sweaty aerobic exercise per week.  4)Reduce stress - consider counseling, meditation and relaxation to balance other aspects of your life.

## 2018-07-18 NOTE — Progress Notes (Signed)
HPI:  Using dictation device. Unfortunately this device frequently misinterprets words/phrases.  Follow up hx elevated BP, hyperglycemia, obesity: -she wanted to work on lifestyle changes, but unfortunately has not been eating healthy and has not been exercising -She has gained some weight -Her blood pressure was a little elevated at work recently and she felt a little lightheaded -reports blood pressure was in the 140s over 80s -Blood pressure was 144/90 when she arrived today, better on recheck at 132/80 -No chest pain, shortness of breath, headache today -Reports strong family history of hypertension   ROS: See pertinent positives and negatives per HPI.  Past Medical History:  Diagnosis Date  . Hypertension   . Sickle cell trait ([place])     History reviewed. No pertinent surgical history.  Family History  Problem Relation Age of Onset  . Hypertension Mother   . Diabetes Maternal Aunt   . Hypertension Maternal Aunt     SOCIAL HX: See HPI, denies alcohol or tobacco use   Current Outpatient Medications:  .  albuterol (PROAIR HFA) 108 (90 Base) MCG/ACT inhaler, Inhale 2 puffs into the lungs every 6 (six) hours as needed for wheezing or shortness of breath., Disp: 1 Inhaler, Rfl: 0 .  ibuprofen (ADVIL,MOTRIN) 200 MG tablet, Take 400 mg by mouth every 6 (six) hours as needed for moderate pain., Disp: , Rfl:  .  norethindrone-ethinyl estradiol (BREVICON, 28,) 0.5-35 MG-MCG tablet, Take 1 tablet by mouth at bedtime., Disp: 3 Package, Rfl: 4 .  Prenatal Vit-Fe Fumarate-FA (PRENATAL MULTIVITAMIN) TABS tablet, Take 1 tablet by mouth daily at 12 noon., Disp: , Rfl:  .  amLODipine (NORVASC) 2.5 MG tablet, Take 1 tablet (2.5 mg total) by mouth daily., Disp: 90 tablet, Rfl: 3  EXAM:  Vitals:   07/18/18 1630 07/18/18 1653  BP: (!) 144/90 132/80  Pulse: 83   Temp: 98.5 F (36.9 C)   SpO2: 98%     Body mass index is 30.21 kg/m.  GENERAL: vitals reviewed and listed above, alert,  oriented, appears well hydrated and in no acute distress  HEENT: atraumatic, conjunttiva clear, no obvious abnormalities on inspection of external nose and ears  NECK: no obvious masses on inspection  LUNGS: clear to auscultation bilaterally, no wheezes, rales or rhonchi, good air movement  CV: HRRR, no peripheral edema  MS: moves all extremities without noticeable abnormality  PSYCH: pleasant and cooperative, no obvious depression or anxiety  ASSESSMENT AND PLAN:  Discussed the following assessment and plan:  Essential hypertension - Plan: Basic metabolic panel  Hyperglycemia  Obesity (BMI 30-39.9)  -discussed options for the treatment of blood pressure -on ocps and may need to consider cessation but this is more likely essential hypertension related to obesity, FH, poor diet and lack of exercise -BP better on recheck but opted to start low dose norvasc after discussion risks/benefits -BMP -follow up 1 month -Patient advised to return or notify a doctor immediately if symptoms worsen or persist or new concerns arise.  Patient Instructions  BEFORE YOU LEAVE: -lab to check kidneys -follow up: as scheduled in September  Start the norvasc and take once daily, 2.5mg   Eat a healthy low sugar diet and get regular exercise  Seek care promptly if new concerns arise or you are not improving with treatment.  We have ordered labs or studies at this visit. It can take up to 1-2 weeks for results and processing. IF results require follow up or explanation, we will call you with instructions. Clinically stable results  will be released to your [facility]. If you have not heard from [place] or cannot find your results in [facility] in 2 weeks please contact our office at [phone].  If you are not yet signed up for [facility], please consider signing up.   We recommend the following healthy lifestyle for LIFE: 1) Small portions. But, make sure to get regular (at least 3 per day), healthy meals  and small healthy snacks if needed.  2) Eat a healthy clean diet.   TRY TO EAT: -at least 5-7 servings of low sugar, colorful, and nutrient rich vegetables per day (not corn, potatoes or bananas.) -berries are the best choice if you wish to eat fruit (only eat small amounts if trying to reduce weight)  -lean meets (fish, white meat of chicken or [place]) -vegan proteins for some meals - beans or tofu, whole grains, nuts and seeds -Replace bad fats with good fats - good fats include: fish, nuts and seeds, canola oil, olive oil -small amounts of low fat or non fat dairy -small amounts of100 % whole grains - check the lables -drink plenty of water  AVOID: -SUGAR, sweets, anything with added sugar, corn syrup or sweeteners - must read labels as even foods advertised as "healthy" often are loaded with sugar -if you must have a sweetener, small amounts of stevia may be best -sweetened beverages and artificially sweetened beverages -simple starches (rice, bread, potatoes, pasta, chips, etc - small amounts of 100% whole grains are ok) -red meat, pork, butter -fried foods, fast food, processed food, excessive dairy, eggs and coconut.  3)Get at least 150 minutes of sweaty aerobic exercise per week.  4)Reduce stress - consider counseling, meditation and relaxation to balance other aspects of your life.             [name], DO

## 2018-07-19 LAB — BASIC METABOLIC PANEL
BUN: 12 mg/dL (ref 6–23)
CHLORIDE: 100 meq/L (ref 96–112)
CO2: 27 meq/L (ref 19–32)
CREATININE: 0.93 mg/dL (ref 0.40–1.20)
Calcium: 9.9 mg/dL (ref 8.4–10.5)
GFR: 87.34 mL/min (ref >60.00)
GLUCOSE: 86 mg/dL (ref 70–99)
Potassium: 4.4 mEq/L (ref 3.5–5.1)
Sodium: 136 mEq/L (ref 135–145)

## 2018-08-19 ENCOUNTER — Telehealth: Payer: Self-pay | Admitting: *Deleted

## 2018-08-19 MED ORDER — AMLODIPINE BESYLATE 2.5 MG PO TABS: 2.5000 mg | ORAL_TABLET | Freq: Every day | ORAL | 3 refills | Status: DC

## 2018-08-19 MED FILL — AMLODIPINE 2.5 MG TABLET: 2.5 | 90 days supply | Qty: 90 | Fill #0

## 2018-08-19 NOTE — Telephone Encounter (Signed)
Copied from [place] [id number]. Topic: General - Other >> Aug 19, 2018  8:29 AM Yvette Rack wrote: Reason for CRM: pt calling stating that she cant get rx amLODipine (NORVASC) 2.5 MG tablet filled at the    [facility], [place] AT [address]   stating that due to insurance she has to get it filled at the  [facility] [place], [place] - [address] [phone] (Phone) [phone] (Fax)  pt has [employer] insurance

## 2018-08-19 NOTE — Telephone Encounter (Signed)
Rx done. 

## 2018-08-29 ENCOUNTER — Ambulatory Visit: Payer: 59 | Admitting: Family Medicine

## 2018-08-29 ENCOUNTER — Encounter: Payer: Self-pay | Admitting: Family Medicine

## 2018-08-29 VITALS — BP 116/64 | HR 73 | Temp 98.4°F | Ht 59.5 in | Wt 153.2 lb

## 2018-08-29 DIAGNOSIS — I10 Essential (primary) hypertension: Secondary | ICD-10-CM

## 2018-08-29 DIAGNOSIS — E669 Obesity, unspecified: Secondary | ICD-10-CM

## 2018-08-29 DIAGNOSIS — R739 Hyperglycemia, unspecified: Secondary | ICD-10-CM

## 2018-08-29 NOTE — Patient Instructions (Signed)
BEFORE YOU LEAVE: -follow up: 3-4 months - we will check labs then  Continue the Norvasc 5 mg daily.  Eat a healthy low sugar diet and get regular aerobic exercise. Try to reduce weight by 5-10 lbs over the next 3-6 months.   We recommend the following healthy lifestyle for LIFE: 1) Small portions. But, make sure to get regular (at least 3 per day), healthy meals and small healthy snacks if needed.  2) Eat a healthy clean diet.   TRY TO EAT: -at least 5-7 servings of low sugar, colorful, and nutrient rich vegetables per day (not corn, potatoes or bananas.) -berries are the best choice if you wish to eat fruit (only eat small amounts if trying to reduce weight)  -lean meets (fish, white meat of chicken or [place]) -vegan proteins for some meals - beans or tofu, whole grains, nuts and seeds -Replace bad fats with good fats - good fats include: fish, nuts and seeds, canola oil, olive oil -small amounts of low fat or non fat dairy -small amounts of100 % whole grains - check the lables -drink plenty of water  AVOID: -SUGAR, sweets, anything with added sugar, corn syrup or sweeteners - must read labels as even foods advertised as "healthy" often are loaded with sugar -if you must have a sweetener, small amounts of stevia may be best -sweetened beverages and artificially sweetened beverages -simple starches (rice, bread, potatoes, pasta, chips, etc - small amounts of 100% whole grains are ok) -red meat, pork, butter -fried foods, fast food, processed food, excessive dairy, eggs and coconut.  3)Get at least 150 minutes of sweaty aerobic exercise per week.  4)Reduce stress - consider counseling, meditation and relaxation to balance other aspects of your life.

## 2018-08-29 NOTE — Progress Notes (Signed)
HPI:  Using dictation device. Unfortunately this device frequently misinterprets words/phrases.  [name] is a pleasant 37 y.o. here for follow up. Chronic medical problems summarized below were reviewed for changes Reports she is doing great on the Norvasc. Continues to try to eat healthy and reports is active at work.  Denies CP, SOB, DOE, treatment intolerance or new symptoms. Did flu shot at work.  Hypertension: -Started low-dose of Norvasc 07/2018, also recommended lifestyle changes  Hyperglycemia/obesity: -Recommended lifestyle changes -Weight 152 8/19 --> 9/19 153  ROS: See pertinent positives and negatives per HPI.  Past Medical History:  Diagnosis Date  . Hypertension   . Sickle cell trait ([place])     History reviewed. No pertinent surgical history.  Family History  Problem Relation Age of Onset  . Hypertension Mother   . Diabetes Maternal Aunt   . Hypertension Maternal Aunt     SOCIAL HX: see hpi   Current Outpatient Medications:  .  albuterol (PROAIR HFA) 108 (90 Base) MCG/ACT inhaler, Inhale 2 puffs into the lungs every 6 (six) hours as needed for wheezing or shortness of breath., Disp: 1 Inhaler, Rfl: 0 .  amLODipine (NORVASC) 2.5 MG tablet, Take 1 tablet (2.5 mg total) by mouth daily., Disp: 90 tablet, Rfl: 3 .  ibuprofen (ADVIL,MOTRIN) 200 MG tablet, Take 400 mg by mouth every 6 (six) hours as needed for moderate pain., Disp: , Rfl:  .  norethindrone-ethinyl estradiol (BREVICON, 28,) 0.5-35 MG-MCG tablet, Take 1 tablet by mouth at bedtime., Disp: 3 Package, Rfl: 4 .  Prenatal Vit-Fe Fumarate-FA (PRENATAL MULTIVITAMIN) TABS tablet, Take 1 tablet by mouth daily at 12 noon., Disp: , Rfl:   EXAM:  Vitals:   08/29/18 1600  BP: 116/64  Pulse: 73  Temp: 98.4 F (36.9 C)    Body mass index is 30.42 kg/m.  GENERAL: vitals reviewed and listed above, alert, oriented, appears well hydrated and in no acute distress  HEENT: atraumatic, conjunttiva  clear, no obvious abnormalities on inspection of external nose and ears  NECK: no obvious masses on inspection  LUNGS: clear to auscultation bilaterally, no wheezes, rales or rhonchi, good air movement  CV: HRRR, no peripheral edema  MS: moves all extremities without noticeable abnormality  PSYCH: pleasant and cooperative, no obvious depression or anxiety  ASSESSMENT AND PLAN:  Discussed the following assessment and plan:  Essential hypertension  Hyperglycemia  Obesity (BMI 30-39.9)  -cont norvasc -lifestyle recs - see pt instructions -labs at follow up in 3-4 months  Patient Instructions  BEFORE YOU LEAVE: -follow up: 3-4 months - we will check labs then  Continue the Norvasc 5 mg daily.  Eat a healthy low sugar diet and get regular aerobic exercise. Try to reduce weight by 5-10 lbs over the next 3-6 months.   We recommend the following healthy lifestyle for LIFE: 1) Small portions. But, make sure to get regular (at least 3 per day), healthy meals and small healthy snacks if needed.  2) Eat a healthy clean diet.   TRY TO EAT: -at least 5-7 servings of low sugar, colorful, and nutrient rich vegetables per day (not corn, potatoes or bananas.) -berries are the best choice if you wish to eat fruit (only eat small amounts if trying to reduce weight)  -lean meets (fish, white meat of chicken or [place]) -vegan proteins for some meals - beans or tofu, whole grains, nuts and seeds -Replace bad fats with good fats - good fats include: fish, nuts and seeds, canola  oil, olive oil -small amounts of low fat or non fat dairy -small amounts of100 % whole grains - check the lables -drink plenty of water  AVOID: -SUGAR, sweets, anything with added sugar, corn syrup or sweeteners - must read labels as even foods advertised as "healthy" often are loaded with sugar -if you must have a sweetener, small amounts of stevia may be best -sweetened beverages and artificially sweetened  beverages -simple starches (rice, bread, potatoes, pasta, chips, etc - small amounts of 100% whole grains are ok) -red meat, pork, butter -fried foods, fast food, processed food, excessive dairy, eggs and coconut.  3)Get at least 150 minutes of sweaty aerobic exercise per week.  4)Reduce stress - consider counseling, meditation and relaxation to balance other aspects of your life.     [name], DO

## 2018-09-09 ENCOUNTER — Other Ambulatory Visit: Payer: Self-pay | Admitting: Obstetrics & Gynecology

## 2018-10-08 ENCOUNTER — Ambulatory Visit (INDEPENDENT_AMBULATORY_CARE_PROVIDER_SITE_OTHER): Payer: Self-pay | Admitting: Nurse Practitioner

## 2018-10-08 VITALS — BP 122/86 | HR 75 | Temp 99.0°F | Resp 17 | Wt 157.0 lb

## 2018-10-08 DIAGNOSIS — R05 Cough: Secondary | ICD-10-CM

## 2018-10-08 DIAGNOSIS — R059 Cough, unspecified: Secondary | ICD-10-CM

## 2018-10-08 DIAGNOSIS — J309 Allergic rhinitis, unspecified: Secondary | ICD-10-CM

## 2018-10-08 MED ORDER — BENZONATATE 200 MG PO CAPS: 200.0000 mg | ORAL_CAPSULE | Freq: Three times a day (TID) | ORAL | 0 refills | Status: AC | PRN

## 2018-10-08 MED ORDER — MONTELUKAST SODIUM 10 MG PO TABS: 10.0000 mg | ORAL_TABLET | Freq: Every day | ORAL | 0 refills | Status: DC

## 2018-10-08 MED FILL — MONTELUKAST SOD 10 MG TAB: 10 | 30 days supply | Qty: 30 | Fill #0

## 2018-10-08 MED FILL — BENZONATATE 200 MG CAPS: 200 | 10 days supply | Qty: 30 | Fill #0

## 2018-10-08 NOTE — Patient Instructions (Signed)
Allergic Rhinitis, Adult -Take medications as prescribed. -Ibuprofen or Tylenol for pain, fever, and general discomfort. -Recommend purchasing over-the-counter Mucinex- plain to help with chest congestion. -Increase fluids. -Sleep elevated on 2 pillows at bedtime to help with cough. -Use a humidifier or vaporizer when at home and during sleep. -Follow-up in our clinic if symptoms do not improve within the next 3 to 5 days.   Allergic rhinitis is an allergic reaction that affects the mucous membrane inside the nose. It causes sneezing, a runny or stuffy nose, and the feeling of mucus going down the back of the throat (postnasal drip). Allergic rhinitis can be mild to severe. There are two types of allergic rhinitis:  Seasonal. This type is also called hay fever. It happens only during certain seasons.  Perennial. This type can happen at any time of the year.  What are the causes? This condition happens when the body's defense system (immune system) responds to certain harmless substances called allergens as though they were germs.  Seasonal allergic rhinitis is triggered by pollen, which can come from grasses, trees, and weeds. Perennial allergic rhinitis may be caused by:  House dust mites.  Pet dander.  Mold spores.  What are the signs or symptoms? Symptoms of this condition include:  Sneezing.  Runny or stuffy nose (nasal congestion).  Postnasal drip.  Itchy nose.  Tearing of the eyes.  Trouble sleeping.  Daytime sleepiness.  How is this diagnosed? This condition may be diagnosed based on:  Your medical history.  A physical exam.  Tests to check for related conditions, such as: ? Asthma. ? Pink eye. ? Ear infection. ? Upper respiratory infection.  Tests to find out which allergens trigger your symptoms. These may include skin or blood tests.  How is this treated? There is no cure for this condition, but treatment can help control symptoms. Treatment may  include:  Taking medicines that block allergy symptoms, such as antihistamines. Medicine may be given as a shot, nasal spray, or pill.  Avoiding the allergen.  Desensitization. This treatment involves getting ongoing shots until your body becomes less sensitive to the allergen. This treatment may be done if other treatments do not help.  If taking medicine and avoiding the allergen does not work, new, stronger medicines may be prescribed.  Follow these instructions at home:  Find out what you are allergic to. Common allergens include smoke, dust, and pollen.  Avoid the things you are allergic to. These are some things you can do to help avoid allergens: ? Replace carpet with wood, tile, or vinyl flooring. Carpet can trap dander and dust. ? Do not smoke. Do not allow smoking in your home. ? Change your heating and air conditioning filter at least once a month. ? During allergy season:  Keep windows closed as much as possible.  Plan outdoor activities when pollen counts are lowest. This is usually during the evening hours.  When coming indoors, change clothing and shower before sitting on furniture or bedding.  Take over-the-counter and prescription medicines only as told by your health care provider.  Keep all follow-up visits as told by your health care provider. This is important. Contact a health care provider if:  You have a fever.  You develop a persistent cough.  You make whistling sounds when you breathe (you wheeze).  Your symptoms interfere with your normal daily activities. Get help right away if:  You have shortness of breath. Summary  This condition can be managed by taking medicines  as directed and avoiding allergens.  Contact your health care provider if you develop a persistent cough or fever.  During allergy season, keep windows closed as much as possible. This information is not intended to replace advice given to you by your health care provider. Make  sure you discuss any questions you have with your health care provider. Document Released: 08/22/2001 Document Revised: 01/04/2017 Document Reviewed: 01/04/2017 Elsevier Interactive Patient Education  2018 [employer].  Cough, Adult Coughing is a reflex that clears your throat and your airways. Coughing helps to heal and protect your lungs. It is normal to cough occasionally, but a cough that happens with other symptoms or lasts a long time may be a sign of a condition that needs treatment. A cough may last only 2-3 weeks (acute), or it may last longer than 8 weeks (chronic). What are the causes? Coughing is commonly caused by:  Breathing in substances that irritate your lungs.  A viral or bacterial respiratory infection.  Allergies.  Asthma.  Postnasal drip.  Smoking.  Acid backing up from the stomach into the esophagus (gastroesophageal reflux).  Certain medicines.  Chronic lung problems, including COPD (or rarely, lung cancer).  Other medical conditions such as heart failure.  Follow these instructions at home: Pay attention to any changes in your symptoms. Take these actions to help with your discomfort:  Take medicines only as told by your health care provider. ? If you were prescribed an antibiotic medicine, take it as told by your health care provider. Do not stop taking the antibiotic even if you start to feel better. ? Talk with your health care provider before you take a cough suppressant medicine.  Drink enough fluid to keep your urine clear or pale yellow.  If the air is dry, use a cold steam vaporizer or humidifier in your bedroom or your home to help loosen secretions.  Avoid anything that causes you to cough at work or at home.  If your cough is worse at night, try sleeping in a semi-upright position.  Avoid cigarette smoke. If you smoke, quit smoking. If you need help quitting, ask your health care provider.  Avoid caffeine.  Avoid alcohol.  Rest as  needed.  Contact a health care provider if:  You have new symptoms.  You cough up pus.  Your cough does not get better after 2-3 weeks, or your cough gets worse.  You cannot control your cough with suppressant medicines and you are losing sleep.  You develop pain that is getting worse or pain that is not controlled with pain medicines.  You have a fever.  You have unexplained weight loss.  You have night sweats. Get help right away if:  You cough up blood.  You have difficulty breathing.  Your heartbeat is very fast. This information is not intended to replace advice given to you by your health care provider. Make sure you discuss any questions you have with your health care provider. Document Released: 05/26/2011 Document Revised: 05/04/2016 Document Reviewed: 02/03/2015 Elsevier Interactive Patient Education  [employer].

## 2018-10-08 NOTE — Progress Notes (Signed)
Subjective:     [name] is a 37 y.o. female here for evaluation of a cough.  The cough is non-productive, without wheezing, dyspnea or hemoptysis and is aggravated by  postnasal drip. Onset of symptoms was 3 days ago, unchanged since that time.  Associated symptoms include headache. Patient does not have a history of asthma. Patient has not had recent travel. Patient does not have a history of smoking.  Patient states she does have a history of seasonal allergies.  Patient has attempted to use Benadryl for her symptoms without relief.   The following portions of the patient's history were reviewed and updated as appropriate: allergies, current medications and past medical history.  Review of Systems Constitutional: positive for fatigue, negative for anorexia, chills, fevers and malaise Eyes: negative Ears, nose, mouth, throat, and face: positive for postnasal drip, negative for ear drainage, earaches, nasal congestion and sore throat Respiratory: positive for chronic bronchitis and cough, negative for asthma, dyspnea on exertion, hemoptysis, pneumonia, sputum, stridor and wheezing Cardiovascular: negative Gastrointestinal: negative except for decreased appetite Neurological: positive for headaches, negative for coordination problems, dizziness, paresthesia, speech problems, tremors, vertigo and weakness Allergic/Immunologic: positive for hay fever     Objective:    BP 122/86 (BP Location: Right Arm, Patient Position: Sitting, Cuff Size: Normal)   Pulse 75   Temp 99 F (37.2 C) (Oral)   Resp 17   Wt 157 lb (71.2 kg)   SpO2 96%   BMI 31.18 kg/m  General appearance: alert, cooperative, fatigued and no distress Head: Normocephalic, without obvious abnormality, atraumatic Eyes: conjunctivae/corneas clear. PERRL, EOM's intact. Fundi benign. Ears: normal TM's and external ear canals both ears Nose: no discharge, mild congestion, turbinates swollen, inflamed, mild maxillary  sinus tenderness bilateral, no frontal sinus tenderness bilateral Throat: lips, mucosa, and tongue normal; teeth and gums normal Lungs: clear to auscultation bilaterally Heart: regular rate and rhythm, S1, S2 normal, no murmur, click, rub or gallop Abdomen: soft, non-tender; bowel sounds normal; no masses,  no organomegaly Pulses: 2+ and symmetric Skin: Skin color, texture, turgor normal. No rashes or lesions Lymph nodes: cervical and submandibular nodes normal Neurologic: Grossly normal    Assessment:    Allergic Rhinitis and Cough   Plan:   Exam findings, diagnosis etiology and medication use and indications reviewed with patient. Follow- Up and discharge instructions provided. No emergent/urgent issues found on exam. Patient education was provided. Patient verbalized understanding of information provided and agrees with plan of care (POC), all questions answered. The patient is advised to call or return to clinic if condition does not see an improvement in symptoms, or to seek the care of the closest emergency department if condition worsens with the above plan.   1. Allergic rhinitis, unspecified seasonality, unspecified trigger  - montelukast (SINGULAIR) 10 MG tablet; Take 1 tablet (10 mg total) by mouth at bedtime.  Dispense: 30 tablet; Refill: 0 - benzonatate (TESSALON) 200 MG capsule; Take 1 capsule (200 mg total) by mouth 3 (three) times daily as needed for up to 10 days for cough.  Dispense: 30 capsule; Refill: 0 -Take medications as prescribed. -Ibuprofen or Tylenol for pain, fever, and general discomfort. -Recommend purchasing over-the-counter Mucinex- plain to help with chest congestion. -Increase fluids. -Sleep elevated on 2 pillows at bedtime to help with cough. -Use a humidifier or vaporizer when at home and during sleep. -Follow-up in our clinic if symptoms do not improve within the next 3 to 5 days.

## 2018-11-08 MED FILL — AMLODIPINE 2.5 MG TABLET: 2.5 | 90 days supply | Qty: 90 | Fill #1

## 2018-11-15 ENCOUNTER — Ambulatory Visit: Payer: Self-pay | Admitting: *Deleted

## 2018-11-15 ENCOUNTER — Ambulatory Visit (INDEPENDENT_AMBULATORY_CARE_PROVIDER_SITE_OTHER): Payer: Self-pay | Admitting: Obstetrics and Gynecology

## 2018-11-15 VITALS — BP 120/80 | HR 84 | Temp 99.0°F | Resp 20 | Wt 153.4 lb

## 2018-11-15 DIAGNOSIS — Z9109 Other allergy status, other than to drugs and biological substances: Secondary | ICD-10-CM | POA: Insufficient documentation

## 2018-11-15 DIAGNOSIS — R062 Wheezing: Secondary | ICD-10-CM | POA: Insufficient documentation

## 2018-11-15 DIAGNOSIS — J40 Bronchitis, not specified as acute or chronic: Secondary | ICD-10-CM

## 2018-11-15 DIAGNOSIS — R05 Cough: Secondary | ICD-10-CM

## 2018-11-15 DIAGNOSIS — R059 Cough, unspecified: Secondary | ICD-10-CM | POA: Insufficient documentation

## 2018-11-15 MED ORDER — BENZONATATE 100 MG PO CAPS: 100.0000 mg | ORAL_CAPSULE | Freq: Two times a day (BID) | ORAL | 0 refills | Status: DC | PRN

## 2018-11-15 MED ORDER — ALBUTEROL SULFATE HFA 108 (90 BASE) MCG/ACT IN AERS: 2.0000 | INHALATION_SPRAY | Freq: Four times a day (QID) | RESPIRATORY_TRACT | 2 refills | Status: DC | PRN

## 2018-11-15 MED ORDER — IPRATROPIUM BROMIDE 0.02 % IN SOLN
0.5000 mg | Freq: Once | RESPIRATORY_TRACT | Status: AC
  Administered 2018-11-15: 0.5 mg via RESPIRATORY_TRACT

## 2018-11-15 MED ORDER — PREDNISONE 20 MG PO TABS: 60.0000 mg | ORAL_TABLET | Freq: Every day | ORAL | 0 refills | Status: AC

## 2018-11-15 MED FILL — BENZONATATE 100 MG CAP: 100 | 5 days supply | Qty: 20 | Fill #0

## 2018-11-15 MED FILL — VENTOLIN HFA 90 MCG INHALER: 108 (90 BAS | 25 days supply | Qty: 18 | Fill #0

## 2018-11-15 MED FILL — predniSONE 20 MG TABS: 20 | 5 days supply | Qty: 15 | Fill #0

## 2018-11-15 NOTE — Telephone Encounter (Signed)
Pt called with having some shortness of breath from coughing. She has a hx of bronchitis and allergies. She stated that she is living with her mom until her house is ready to move it and her mom smokes. More than one person in the house smokes.  She has been there since Saturday and today she is coughing and wheezing. Not audible over the phone. The coughing is constant and non productive. Denies fever.  She has an inhaler that she has used twice today. She has a hx of HTN. She is in her car right now driving around to see what office can see her. No availability at [facility]. Pt going to [employer].  Advised to call back for any concerns. Pt voiced understanding. Routing to [facility] at [place].  Reason for Disposition . [1] Continuous (nonstop) coughing AND [2] keeps from working or sleeping  Answer Assessment - Initial Assessment Questions 1. RESPIRATORY STATUS: "Describe your breathing?" (e.g., wheezing, shortness of breath, unable to speak, severe coughing)      Cough, shortness of breath, wheezing 2. ONSET: "When did this breathing problem begin?"      today 3. PATTERN "Does the difficult breathing come and go, or has it been constant since it started?"      constant 4. SEVERITY: "How bad is your breathing?" (e.g., mild, moderate, severe)    - MILD: No SOB at rest, mild SOB with walking, speaks normally in sentences, can lay down, no retractions, pulse < 100.    - MODERATE: SOB at rest, SOB with minimal exertion and prefers to sit, cannot lie down flat, speaks in phrases, mild retractions, audible wheezing, pulse 100-120.    - SEVERE: Very SOB at rest, speaks in single words, struggling to breathe, sitting hunched forward, retractions, pulse > 120      moderate 5. RECURRENT SYMPTOM: "Have you had difficulty breathing before?" If so, ask: "When was the last time?" and "What happened that time?"      Yes in January and Feb and was prescribed an inhaler 6. CARDIAC HISTORY: "Do you have  any history of heart disease?" (e.g., heart attack, angina, bypass surgery, angioplasty)      no 7. LUNG HISTORY: "Do you have any history of lung disease?"  (e.g., pulmonary embolus, asthma, emphysema)     no 8. CAUSE: "What do you think is causing the breathing problem?"     Being around smokers 9. OTHER SYMPTOMS: "Do you have any other symptoms? (e.g., dizziness, runny nose, cough, chest pain, fever)     Weakness, cough, chest pain from coughing 10. PREGNANCY: "Is there any chance you are pregnant?" "When was your last menstrual period?"       No on Birth control. LMP does not have periods 11. TRAVEL: "Have you traveled out of the country in the last month?" (e.g., travel history, exposures)       no  Protocols used: BREATHING DIFFICULTY-A-AH

## 2018-11-15 NOTE — Progress Notes (Signed)
  Subjective:     Patient ID: [name], female   DOB: November 18, 1981, 37 y.o.   MRN: [id number]  HPI   [name] is a 37 y.o. female with a history of bronchitis here with cough, congestion and some SOB with exertion. Symptoms started 5 days ago.  She is currently building a house and is staying with family members who smoke inside the house. She feels this has aggravated her allergies.  She has noticed that when she coughs she has some wheezing. She has been using her inhaler at home which is helping some.  The cough is waking her up in the middle of the night. She is not a smoker. No history of asthma, however feels with her history of bronchitis she may have underlying asthma.   Review of Systems  Constitutional: Negative for fever.  HENT: Positive for congestion. Negative for sore throat.   Respiratory: Positive for cough, shortness of breath and wheezing.    Objective:   Physical Exam  Constitutional: She is oriented to person, place, and time. She appears well-developed and well-nourished. No distress.  Eyes: Pupils are equal, round, and reactive to light.  Pulmonary/Chest: Effort normal. No stridor. She has wheezes in the right lower field and the left lower field. She has no rhonchi. She has no rales.  Musculoskeletal: Normal range of motion.  Neurological: She is alert and oriented to person, place, and time.  Skin: Skin is warm. She is not diaphoretic.   Assessment:   1. Wheezing   2. Cough   3. Bronchitis   4. Environmental allergies    Plan:   - Bromide nebulizer treatment in the office. Patient feels significantly better. No wheezes on reassessment following treatment.  - Zyrtec daily, Benadryl 12.5 mg at night OTC - discussed air purifying for bedroom  - Rx: prednisone, Tessalon Perles - Continue albuterol inhaler as needed - Return if symptoms are not improving in the next 48-72 hours - Avoid allergy triggers.   Lezlie Lye,  NP 11/15/18 12:44 PM

## 2018-11-15 NOTE — Telephone Encounter (Signed)
Noted  

## 2018-11-28 ENCOUNTER — Encounter: Payer: Self-pay | Admitting: Family Medicine

## 2018-11-28 ENCOUNTER — Ambulatory Visit: Payer: 59 | Admitting: Family Medicine

## 2018-11-28 VITALS — BP 120/70 | HR 92 | Temp 98.3°F | Ht 59.5 in | Wt 151.3 lb

## 2018-11-28 DIAGNOSIS — R739 Hyperglycemia, unspecified: Secondary | ICD-10-CM | POA: Diagnosis not present

## 2018-11-28 DIAGNOSIS — E669 Obesity, unspecified: Secondary | ICD-10-CM | POA: Diagnosis not present

## 2018-11-28 DIAGNOSIS — D72829 Elevated white blood cell count, unspecified: Secondary | ICD-10-CM | POA: Diagnosis not present

## 2018-11-28 DIAGNOSIS — I10 Essential (primary) hypertension: Secondary | ICD-10-CM | POA: Diagnosis not present

## 2018-11-28 NOTE — Progress Notes (Signed)
HPI:  Using dictation device. Unfortunately this device frequently misinterprets words/phrases.  [name] is a pleasant 37 y.o. here for follow up. Chronic medical problems summarized below were reviewed for changes and stability and were updated as needed below. These issues and their treatment remain stable for the most part. Had bronchitis earlier this month - treated at [facility]. Reports doing much better now. Was under a lot of stress at work a few weeks ago an dmoving and had elevated BP and sometimes felt lightheaded when up. Eating healthier - increased vegetables. Now doing much better. Denies CP, SOB, DOE, treatment intolerance or new symptoms.  Hypertension: -Started low-dose of Norvasc 07/2018, also recommended lifestyle changes  Hyperglycemia/obesity: -Recommended lifestyle changes -Weight 152 8/19 --> 9/19 153 --> 151 (12/19)  ROS: See pertinent positives and negatives per HPI.  Past Medical History:  Diagnosis Date  . Hypertension   . Sickle cell trait ([place])     History reviewed. No pertinent surgical history.  Family History  Problem Relation Age of Onset  . Hypertension Mother   . Diabetes Maternal Aunt   . Hypertension Maternal Aunt     SOCIAL HX: see hpi   Current Outpatient Medications:  .  albuterol (PROAIR HFA) 108 (90 Base) MCG/ACT inhaler, Inhale 2 puffs into the lungs every 6 (six) hours as needed for wheezing or shortness of breath., Disp: 1 Inhaler, Rfl: 2 .  amLODipine (NORVASC) 2.5 MG tablet, Take 1 tablet (2.5 mg total) by mouth daily., Disp: 90 tablet, Rfl: 3 .  benzonatate (TESSALON) 100 MG capsule, Take 1-2 capsules (100-200 mg total) by mouth 2 (two) times daily as needed for cough., Disp: 20 capsule, Rfl: 0 .  ibuprofen (ADVIL,MOTRIN) 200 MG tablet, Take 400 mg by mouth every 6 (six) hours as needed for moderate pain., Disp: , Rfl:  .  NECON 0.5/35, 28, 0.5-35 MG-MCG tablet, TAKE 1 TABLET BY MOUTH AT BEDTIME, Disp: 28 tablet, Rfl:  9 .  norethindrone-ethinyl estradiol (BREVICON, 28,) 0.5-35 MG-MCG tablet, Take 1 tablet by mouth at bedtime., Disp: 3 Package, Rfl: 4 .  Prenatal Vit-Fe Fumarate-FA (PRENATAL MULTIVITAMIN) TABS tablet, Take 1 tablet by mouth daily at 12 noon., Disp: , Rfl:  .  montelukast (SINGULAIR) 10 MG tablet, Take 1 tablet (10 mg total) by mouth at bedtime., Disp: 30 tablet, Rfl: 0  EXAM:  Vitals:   11/28/18 1557  BP: 120/70  Pulse: 92  Temp: 98.3 F (36.8 C)  SpO2: 98%    Body mass index is 30.05 kg/m.  GENERAL: vitals reviewed and listed above, alert, oriented, appears well hydrated and in no acute distress  HEENT: atraumatic, conjunttiva clear, no obvious abnormalities on inspection of external nose and ears  NECK: no obvious masses on inspection  LUNGS: clear to auscultation bilaterally, no wheezes, rales or rhonchi, good air movement  CV: HRRR, no peripheral edema  MS: moves all extremities without noticeable abnormality  PSYCH: pleasant and cooperative, no obvious depression or anxiety  ASSESSMENT AND PLAN:  Discussed the following assessment and plan:  Essential hypertension - Plan: Basic metabolic panel, CBC  Hyperglycemia  Obesity (BMI 30-39.9)  -glad feeling better, BP great today -lifestyle recs -labs per orders -follow up 3-4 months -Patient advised to return or notify a doctor immediately if symptoms worsen or persist or new concerns arise.  Patient Instructions  BEFORE YOU LEAVE: -labs -follow up: 3-4 months   Continue blood pressure medication.  Lose 15-20lbs - eat a healthy low sugar diet and get  regular aerobic exercise.    We recommend the following healthy lifestyle for LIFE:  No smoking or exposure. No excessive alcohol.   1) Small portions. But, make sure to get regular (at least 3 per day), healthy meals and small healthy snacks if needed.  2) Eat a healthy clean diet.   TRY TO EAT: -at least 5-7 servings of low sugar, colorful, and  nutrient rich vegetables per day (not corn, potatoes or bananas.) -berries are the best choice if you wish to eat fruit (only eat small amounts if trying to reduce weight)  -lean meets (fish, white meat of chicken or [place]) -vegan proteins for some meals - beans or tofu, whole grains, nuts and seeds -Replace bad fats with good fats - good fats include: fish, nuts and seeds, canola oil, olive oil -small amounts of low fat or non fat dairy -small amounts of100 % whole grains - check the lables -drink plenty of water  AVOID: -SUGAR, sweets, anything with added sugar, corn syrup or sweeteners - must read labels as even foods advertised as "healthy" often are loaded with sugar -if you must have a sweetener, small amounts of stevia may be best -sweetened beverages and artificially sweetened beverages -simple starches (rice, bread, potatoes, pasta, chips, etc - small amounts of 100% whole grains are ok) -red meat, pork, butter -fried foods, fast food, processed food, excessive dairy, eggs and coconut.  3)Get at least 150 minutes of sweaty aerobic exercise per week.  4)Reduce stress - consider counseling, meditation and relaxation to balance other aspects of your life.      [name], DO

## 2018-11-28 NOTE — Patient Instructions (Signed)
BEFORE YOU LEAVE: -labs -follow up: 3-4 months   Continue blood pressure medication.  Lose 15-20lbs - eat a healthy low sugar diet and get regular aerobic exercise.    We recommend the following healthy lifestyle for LIFE:  No smoking or exposure. No excessive alcohol.   1) Small portions. But, make sure to get regular (at least 3 per day), healthy meals and small healthy snacks if needed.  2) Eat a healthy clean diet.   TRY TO EAT: -at least 5-7 servings of low sugar, colorful, and nutrient rich vegetables per day (not corn, potatoes or bananas.) -berries are the best choice if you wish to eat fruit (only eat small amounts if trying to reduce weight)  -lean meets (fish, white meat of chicken or [place]) -vegan proteins for some meals - beans or tofu, whole grains, nuts and seeds -Replace bad fats with good fats - good fats include: fish, nuts and seeds, canola oil, olive oil -small amounts of low fat or non fat dairy -small amounts of100 % whole grains - check the lables -drink plenty of water  AVOID: -SUGAR, sweets, anything with added sugar, corn syrup or sweeteners - must read labels as even foods advertised as "healthy" often are loaded with sugar -if you must have a sweetener, small amounts of stevia may be best -sweetened beverages and artificially sweetened beverages -simple starches (rice, bread, potatoes, pasta, chips, etc - small amounts of 100% whole grains are ok) -red meat, pork, butter -fried foods, fast food, processed food, excessive dairy, eggs and coconut.  3)Get at least 150 minutes of sweaty aerobic exercise per week.  4)Reduce stress - consider counseling, meditation and relaxation to balance other aspects of your life.

## 2018-11-29 LAB — CBC
HCT: 41.9 % (ref 36.0–46.0)
Hemoglobin: 14.3 g/dL (ref 12.0–15.0)
MCHC: 34 g/dL (ref 30.0–36.0)
MCV: 81.9 fl (ref 78.0–100.0)
Platelets: 274 10*3/uL (ref 150.0–400.0)
RBC: 5.12 Mil/uL — AB (ref 3.87–5.11)
RDW: 13.6 % (ref 11.5–15.5)
WBC: 12.8 10*3/uL — ABNORMAL HIGH (ref 4.0–10.5)

## 2018-11-29 LAB — BASIC METABOLIC PANEL
BUN: 9 mg/dL (ref 6–23)
CALCIUM: 9.2 mg/dL (ref 8.4–10.5)
CO2: 27 meq/L (ref 19–32)
CREATININE: 0.88 mg/dL (ref 0.40–1.20)
Chloride: 103 mEq/L (ref 96–112)
GFR: 92.91 mL/min (ref >60.00)
Glucose, Bld: 119 mg/dL — ABNORMAL HIGH (ref 70–99)
Potassium: 4.2 mEq/L (ref 3.5–5.1)
Sodium: 139 mEq/L (ref 135–145)

## 2018-12-02 NOTE — Addendum Note (Signed)
Addended by: Agnes Lawrence on: 12/02/2018 01:27 PM   Modules accepted: Orders

## 2018-12-15 ENCOUNTER — Ambulatory Visit (INDEPENDENT_AMBULATORY_CARE_PROVIDER_SITE_OTHER): Payer: Self-pay | Admitting: Nurse Practitioner

## 2018-12-15 ENCOUNTER — Encounter: Payer: Self-pay | Admitting: Nurse Practitioner

## 2018-12-15 VITALS — BP 120/80 | HR 100 | Temp 99.1°F | Resp 10 | Wt 152.6 lb

## 2018-12-15 DIAGNOSIS — R05 Cough: Secondary | ICD-10-CM

## 2018-12-15 DIAGNOSIS — R059 Cough, unspecified: Secondary | ICD-10-CM

## 2018-12-15 NOTE — Patient Instructions (Signed)
Cough, Adult -Ibuprofen or Tylenol for pain, fever, or general discomfort. -Increase fluids. -Sleep elevated on at least 2 pillows at bedtime to help with cough. -Use a humidifier or vaporizer when at home and during sleep to help with cough. -Purchase OTC Coricidin HBP for cough. -May use a teaspoon of honey or over-the-counter cough drops to help with cough. -Follow-up if symptoms do not improve.  Coughing is a reflex that clears your throat and your airways. Coughing helps to heal and protect your lungs. It is normal to cough occasionally, but a cough that happens with other symptoms or lasts a long time may be a sign of a condition that needs treatment. A cough may last only 2-3 weeks (acute), or it may last longer than 8 weeks (chronic). What are the causes? Coughing is commonly caused by:  Breathing in substances that irritate your lungs.  A viral or bacterial respiratory infection.  Allergies.  Asthma.  Postnasal drip.  Smoking.  Acid backing up from the stomach into the esophagus (gastroesophageal reflux).  Certain medicines.  Chronic lung problems, including COPD (or rarely, lung cancer).  Other medical conditions such as heart failure. Follow these instructions at home: Pay attention to any changes in your symptoms. Take these actions to help with your discomfort:  Take medicines only as told by your health care provider. ? If you were prescribed an antibiotic medicine, take it as told by your health care provider. Do not stop taking the antibiotic even if you start to feel better. ? Talk with your health care provider before you take a cough suppressant medicine.  Drink enough fluid to keep your urine clear or pale yellow.  If the air is dry, use a cold steam vaporizer or humidifier in your bedroom or your home to help loosen secretions.  Avoid anything that causes you to cough at work or at home.  If your cough is worse at night, try sleeping in a semi-upright  position.  Avoid cigarette smoke. If you smoke, quit smoking. If you need help quitting, ask your health care provider.  Avoid caffeine.  Avoid alcohol.  Rest as needed. Contact a health care provider if:  You have new symptoms.  You cough up pus.  Your cough does not get better after 2-3 weeks, or your cough gets worse.  You cannot control your cough with suppressant medicines and you are losing sleep.  You develop pain that is getting worse or pain that is not controlled with pain medicines.  You have a fever.  You have unexplained weight loss.  You have night sweats. Get help right away if:  You cough up blood.  You have difficulty breathing.  Your heartbeat is very fast. This information is not intended to replace advice given to you by your health care provider. Make sure you discuss any questions you have with your health care provider. Document Released: 05/26/2011 Document Revised: 05/04/2016 Document Reviewed: 02/03/2015 Elsevier Interactive Patient Education  2019 [employer].

## 2018-12-15 NOTE — Progress Notes (Signed)
Subjective:     [name] is a 38 y.o. female here for evaluation of a cough.  The cough is non-productive, with wheezing, dyspnea or hemoptysis and is aggravated by  cigarette smoke. Onset of symptoms was 1 day ago, gradually improving since that time.  Patient was treated in our office on 11/15/18 and diagnosed with bronchitis.  The patient states since she was seen that time, her symptoms did improve and eventually went away.  Patient states that she was at home at her mother's house and her stepfather was smoking and that seemed to aggravate her cough.  Since that time the patient symptoms have improved.  Associated symptoms include wheezing. Patient does not have a history of asthma. Patient has not had recent travel. Patient does not have a history of smoking.  Patient states she has used her in albuterol inhaler since her last visit to help with her symptoms, patient also has cough Perles that she was previously prescribed.  The following portions of the patient's history were reviewed and updated as appropriate: allergies, current medications and past medical history.  Review of Systems Constitutional: negative Eyes: negative Ears, nose, mouth, throat, and face: negative Respiratory: positive for cough, wheezing and history of bronchitis, negative for asthma, dyspnea on exertion, pneumonia, sputum and stridor Cardiovascular: negative Gastrointestinal: negative Neurological: positive for develops headache with coughing     Objective:   BP 120/80   Pulse 100   Temp 99.1 F (37.3 C)   Resp 10   Wt 152 lb 9.6 oz (69.2 kg)   SpO2 96%   BMI 30.31 kg/m  General appearance: alert, cooperative and no distress Head: Normocephalic, without obvious abnormality, atraumatic Eyes: conjunctivae/corneas clear. PERRL, EOM's intact. Fundi benign. Ears: normal TM's and external ear canals both ears Nose: Nares normal. Septum midline. Mucosa normal. No drainage or sinus  tenderness. Throat: lips, mucosa, and tongue normal; teeth and gums normal Lungs: clear to auscultation bilaterally Heart: regular rate and rhythm, S1, S2 normal, no murmur, click, rub or gallop Abdomen: soft, non-tender; bowel sounds normal; no masses,  no organomegaly Pulses: 2+ and symmetric Skin: Skin color, texture, turgor normal. No rashes or lesions Lymph nodes: cervical and submandibular nodes normal Neurologic: Grossly normal    Assessment:    Cough    Plan:   Exam findings, diagnosis etiology and medication use and indications reviewed with patient. Follow- Up and discharge instructions provided. No emergent/urgent issues found on exam.  Gust with the patient that I believe the cause of her symptoms are related to her being around cigarette smoke.  Patient does not demonstrate any other symptomatic features to include fever, chills, productive cough, sore throat, or headache.gust with patient that she will need to continue symptomatic treatment.  Do not feel the need to prescribe any additional medications at this time as patient does have a current albuterol inhaler with a sufficient supply, along with cough Perles.  Instructed patient to use her 200 mg cough Perles and then switch over to her 100 mg cough Perles taking 2 Perles at a time as needed up to 3 times daily.  Instructed patient that if symptoms worsen to include fever, chills, worsening cough, or persistent cough or worsening wheezing to follow-up in our office.  Patient education was provided. Patient verbalized understanding of information provided and agrees with plan of care (POC), all questions answered. The patient is advised to call or return to clinic if condition does not see an improvement in symptoms, or  to seek the care of the closest emergency department if condition worsens with the above plan.    1. Cough  -Ibuprofen or Tylenol for pain, fever, or general discomfort. -Increase fluids. -Sleep elevated on at  least 2 pillows at bedtime to help with cough. -Use a humidifier or vaporizer when at home and during sleep to help with cough. -Purchase OTC Coricidin HBP for cough. -May use a teaspoon of honey or over-the-counter cough drops to help with cough. -Follow-up if symptoms do not improve.

## 2019-01-31 ENCOUNTER — Other Ambulatory Visit (INDEPENDENT_AMBULATORY_CARE_PROVIDER_SITE_OTHER): Payer: 59

## 2019-01-31 DIAGNOSIS — D72829 Elevated white blood cell count, unspecified: Secondary | ICD-10-CM

## 2019-01-31 LAB — CBC WITH DIFFERENTIAL/PLATELET
Basophils Absolute: 0 10*3/uL (ref 0.0–0.1)
Basophils Relative: 0.3 % (ref 0.0–3.0)
EOS ABS: 0.3 10*3/uL (ref 0.0–0.7)
Eosinophils Relative: 2.7 % (ref 0.0–5.0)
HEMATOCRIT: 40.8 % (ref 36.0–46.0)
HEMOGLOBIN: 14.2 g/dL (ref 12.0–15.0)
LYMPHS PCT: 20.8 % (ref 12.0–46.0)
Lymphs Abs: 2 10*3/uL (ref 0.7–4.0)
MCHC: 34.7 g/dL (ref 30.0–36.0)
MCV: 80.8 fl (ref 78.0–100.0)
MONO ABS: 0.6 10*3/uL (ref 0.1–1.0)
Monocytes Relative: 6.4 % (ref 3.0–12.0)
Neutro Abs: 6.6 10*3/uL (ref 1.4–7.7)
Neutrophils Relative %: 69.8 % (ref 43.0–77.0)
Platelets: 302 10*3/uL (ref 150.0–400.0)
RBC: 5.04 Mil/uL (ref 3.87–5.11)
RDW: 13.4 % (ref 11.5–15.5)
WBC: 9.5 10*3/uL (ref 4.0–10.5)

## 2019-01-31 MED FILL — AMLODIPINE 2.5 MG TABLET: 2.5 | 90 days supply | Qty: 90 | Fill #2

## 2019-02-06 ENCOUNTER — Encounter: Payer: Self-pay | Admitting: Family Medicine

## 2019-02-06 ENCOUNTER — Ambulatory Visit: Payer: Self-pay | Admitting: *Deleted

## 2019-02-06 ENCOUNTER — Ambulatory Visit: Payer: 59 | Admitting: Family Medicine

## 2019-02-06 VITALS — BP 110/80 | HR 102 | Temp 98.7°F | Ht 59.5 in | Wt 154.0 lb

## 2019-02-06 DIAGNOSIS — J329 Chronic sinusitis, unspecified: Secondary | ICD-10-CM | POA: Diagnosis not present

## 2019-02-06 DIAGNOSIS — J310 Chronic rhinitis: Secondary | ICD-10-CM

## 2019-02-06 DIAGNOSIS — R42 Dizziness and giddiness: Secondary | ICD-10-CM | POA: Diagnosis not present

## 2019-02-06 MED ORDER — AMOXICILLIN-POT CLAVULANATE 875-125 MG PO TABS: 1.0000 | ORAL_TABLET | Freq: Two times a day (BID) | ORAL | 0 refills | Status: DC

## 2019-02-06 MED FILL — AMOX-CLAV 875-125 MG TABLET: 875-125 | 7 days supply | Qty: 14 | Fill #0

## 2019-02-06 NOTE — Progress Notes (Signed)
HPI:  Using dictation device. Unfortunately this device frequently misinterprets words/phrases.  Acute visit for several issues:  Vertigo: -started yesterday -when turn head a certain way in bed -sudden brief spinning sensation -no headache, fevers, vision changes, vomiting  Sinus congestion: -on and off for several months -on allergy meds from [facility] -worse the last week or so with thick yellow nasal congestion, sinus discomfort when leans forward -denies fevers, malaise, sob, body aches, NVD  ROS: See pertinent positives and negatives per HPI.  Past Medical History:  Diagnosis Date  . Hypertension   . Sickle cell trait ([place])     History reviewed. No pertinent surgical history.  Family History  Problem Relation Age of Onset  . Hypertension Mother   . Diabetes Maternal Aunt   . Hypertension Maternal Aunt     SOCIAL HX: see hpi   Current Outpatient Medications:  .  albuterol (PROAIR HFA) 108 (90 Base) MCG/ACT inhaler, Inhale 2 puffs into the lungs every 6 (six) hours as needed for wheezing or shortness of breath., Disp: 1 Inhaler, Rfl: 2 .  amLODipine (NORVASC) 2.5 MG tablet, Take 1 tablet (2.5 mg total) by mouth daily., Disp: 90 tablet, Rfl: 3 .  benzonatate (TESSALON) 100 MG capsule, Take 1-2 capsules (100-200 mg total) by mouth 2 (two) times daily as needed for cough., Disp: 20 capsule, Rfl: 0 .  ibuprofen (ADVIL,MOTRIN) 200 MG tablet, Take 400 mg by mouth every 6 (six) hours as needed for moderate pain., Disp: , Rfl:  .  NECON 0.5/35, 28, 0.5-35 MG-MCG tablet, TAKE 1 TABLET BY MOUTH AT BEDTIME, Disp: 28 tablet, Rfl: 9 .  norethindrone-ethinyl estradiol (BREVICON, 28,) 0.5-35 MG-MCG tablet, Take 1 tablet by mouth at bedtime., Disp: 3 Package, Rfl: 4 .  Prenatal Vit-Fe Fumarate-FA (PRENATAL MULTIVITAMIN) TABS tablet, Take 1 tablet by mouth daily at 12 noon., Disp: , Rfl:  .  amoxicillin-clavulanate (AUGMENTIN) 875-125 MG tablet, Take 1 tablet by mouth 2 (two) times  daily., Disp: 14 tablet, Rfl: 0 .  montelukast (SINGULAIR) 10 MG tablet, Take 1 tablet (10 mg total) by mouth at bedtime., Disp: 30 tablet, Rfl: 0  EXAM:  Vitals:   02/06/19 1640  BP: 110/80  Pulse: (!) 102  Temp: 98.7 F (37.1 C)  SpO2: 97%    Body mass index is 30.58 kg/m.  GENERAL: vitals reviewed and listed above, alert, oriented, appears well hydrated and in no acute distress  HEENT: atraumatic, conjunttiva clear, no obvious abnormalities on inspection of external nose and ears, normal appearance of ear canals and TMs, clear nasal congestion, mild post oropharyngeal erythema with PND, no tonsillar edema or exudate, no sinus TTP  NECK: no obvious masses on inspection  LUNGS: clear to auscultation bilaterally, no wheezes, rales or rhonchi, good air movement  CV: HRRR, no peripheral edema  MS: moves all extremities without noticeable abnormality  PSYCH: pleasant and cooperative, no obvious depression or anxiety, speech and thought processing grossly intact, cn ii-xii grossly intact, dix hallpike positive to the L  ASSESSMENT AND PLAN:  Discussed the following assessment and plan:  Vertigo  Rhinosinusitis  -we discussed possible serious and likely etiologies, workup and treatment, treatment risks and return precautions; suspect BPPV as most likely cause of vertigo given hx and exam, ? Sinusitis as weel -after this discussion, Christee opted for trial home treatment maneuvers for vertigo and agrees to call if not resolved and would refer to PT. Advised not to drive with vertigo. Nasal saline for sinus congestion, abx if worsening  or not improving. -follow up advised 2-3 weeks -of course, we advised Vivia  to return or notify a doctor immediately if symptoms worsen or persist or new concerns arise.   Patient Instructions  BEFORE YOU LEAVE: -follow up: 2-3 weeks  Please see the link below for more information and a treatment for  vertigo.  [email or web address]  Please call us if this does not fix the vertigo and we will send a referral for physical therapy for this.  Please do not drive with vertigo.  I hope you are feeling better soon! Seek care promptly if your symptoms worsen, new concerns arise or you are not improving with treatment.  For the sinuses, nasal saline twice daily. If worsening, pain or not improving over the next few days take the antibiotic as prescribed (augmentin.)    [name], DO

## 2019-02-06 NOTE — Telephone Encounter (Signed)
FYI

## 2019-02-06 NOTE — Telephone Encounter (Signed)
Will send to nurse as FYI 

## 2019-02-06 NOTE — Telephone Encounter (Signed)
Pt calls with complaints of vertigo and her ears ringing, and she felt like she was off balance which started the morning of 02/05/2019; the pt says that she had bronchitis, sinus problems, and congestion was [facility] at [facility]; the pt says that the room is spinning when she lays down; recommendations made per nurse triage protocol; the pt is unable to be seen on 02/06/2019 by Dr Maudie Mercury because she has to work; the pt would like to see Dr Maudie Mercury on 02/07/2019 but this provider has no availability; pt offered and accepted appointment with Dr Grier Mitts, [facility], 02/07/2019 at 0800; the pt verbalized understanding; she will also call back and reschedule with Dr Maudie Mercury if she is able to get off early today; will route to office for notification.     Reason for Disposition . [1] MODERATE dizziness (e.g., vertigo; feels very unsteady, interferes with normal activities) AND [2] has NOT been evaluated by physician for this  Answer Assessment - Initial Assessment Questions 1. DESCRIPTION: "Describe your dizziness."     Feels off balance 2. VERTIGO: "Do you feel like either you or the room is spinning or tilting?"      room spins when she lays down 3. LIGHTHEADED: "Do you feel lightheaded?" (e.g., somewhat faint, woozy, weak upon standing)     Dizzy when she sits up 4. SEVERITY: "How bad is it?"  "Can you walk?"   - MILD - Feels unsteady but walking normally.   - MODERATE - Feels very unsteady when walking, but not falling; interferes with normal activities (e.g., school, work) .   - SEVERE - Unable to walk without falling (requires assistance).     mild to moderate 5. ONSET:  "When did the dizziness begin?"     02/05/2019 6. AGGRAVATING FACTORS: "Does anything make it worse?" (e.g., standing, change in head position)     Changing the position of her head and sitting worsens   7. CAUSE: "What do you think is causing the dizziness?"     Not sure 8. RECURRENT SYMPTOM: "Have you had dizziness before?"  If so, ask: "When was the last time?" "What happened that time?"     Always had dizzy spells since a child 1. OTHER SYMPTOMS: "Do you have any other symptoms?" (e.g., headache, weakness, numbness, vomiting, earache)     Ringing in ears 10. PREGNANCY: "Is there any chance you are pregnant?" "When was your last menstrual period?"       No high dose birth control  Protocols used: DIZZINESS - VERTIGO-A-AH

## 2019-02-06 NOTE — Patient Instructions (Signed)
BEFORE YOU LEAVE: -follow up: 2-3 weeks  Please see the link below for more information and a treatment for vertigo.  [email or web address]  Please call us if this does not fix the vertigo and we will send a referral for physical therapy for this.  Please do not drive with vertigo.  I hope you are feeling better soon! Seek care promptly if your symptoms worsen, new concerns arise or you are not improving with treatment.  For the sinuses, nasal saline twice daily. If worsening, pain or not improving over the next few days take the antibiotic as prescribed (augmentin.)

## 2019-02-07 ENCOUNTER — Ambulatory Visit: Payer: Self-pay | Admitting: Family Medicine

## 2019-02-07 ENCOUNTER — Telehealth: Payer: Self-pay | Admitting: Family Medicine

## 2019-02-07 NOTE — Telephone Encounter (Signed)
If she does not have work this weekend I would recommend that she monitor symptoms and then touch base with Dr. Maudie Mercury on Monday if vertigo is not better. If she is getting vertigo with head turning (as documented in note) it would not be safe for her to drive while symptoms are still present. If she needs work not through weekend I am ok with that.

## 2019-02-07 NOTE — Telephone Encounter (Signed)
I called the pt and informed her of the message below.  Patient stated she is scheduled to work this weekend, will see how she feels and call back for a note if needed on Monday.

## 2019-02-07 NOTE — Telephone Encounter (Signed)
Copied from [name] [id number]. Topic: General - Other >> Feb 07, 2019 11:32 AM Oneta Rack wrote: Patient was seen yesterday by her PCP for vertigo in need of clarity regarding PCP recommendation. As per AVS please do not drive with vertigo. Patient states her only source of transportation is to drive ito work this week in need of clinical advice

## 2019-02-24 ENCOUNTER — Ambulatory Visit: Payer: 59 | Admitting: Family Medicine

## 2019-02-24 ENCOUNTER — Encounter: Payer: Self-pay | Admitting: Family Medicine

## 2019-02-24 ENCOUNTER — Telehealth: Payer: Self-pay | Admitting: Family Medicine

## 2019-02-24 ENCOUNTER — Other Ambulatory Visit: Payer: Self-pay

## 2019-02-24 VITALS — BP 120/80 | HR 94 | Temp 98.7°F | Wt 157.3 lb

## 2019-02-24 DIAGNOSIS — R070 Pain in throat: Secondary | ICD-10-CM | POA: Diagnosis not present

## 2019-02-24 DIAGNOSIS — R0981 Nasal congestion: Secondary | ICD-10-CM | POA: Diagnosis not present

## 2019-02-24 DIAGNOSIS — R067 Sneezing: Secondary | ICD-10-CM | POA: Diagnosis not present

## 2019-02-24 DIAGNOSIS — J309 Allergic rhinitis, unspecified: Secondary | ICD-10-CM

## 2019-02-24 NOTE — Telephone Encounter (Signed)
Questions for Screening COVID-19  Symptom onset:  Pt c/o cough since December 2019 - new onset of symptoms of sore throat, runny nose, sneezing at times, clear to yellow mucous and headache x 4 dayy. Pt states that she has been fighting the cough/sinus/vertigo sx's since Dec and is now having worsening sx's. Pt has completed an abx back in Feb/March 2020 (Augmentin 875 given on 02/06/2019) - Pt has been taking OTC HBP Coricidin for her sx's . Denies SOB, chills, fever, body aches.   **Pt aware that she will be seen today but will be asked to wear a mask at today's visit until further accessed.   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   During this illness, did/does the patient experience any of the following symptoms? Fever >100.46F []   Yes [x]   No []   Unknown Subjective fever (felt feverish) []   Yes [x]   No []   Unknown Chills []   Yes [x]   No []   Unknown Muscle aches (myalgia) []   Yes [x]   No []   Unknown Runny nose (rhinorrhea) []   Yes [x]   No []   Unknown Sore throat [x]   Yes []   No []   Unknown Cough (new onset or worsening of chronic cough) [x]   Yes []   No []   Unknown Shortness of breath (dyspnea) []   Yes [x]   No []   Unknown Nausea or vomiting []   Yes [x]   No []   Unknown Headache [x]   Yes []   No []   Unknown Abdominal pain  []   Yes [x]   No []   Unknown Diarrhea (?3 loose/looser than normal stools/24hr period) []   Yes [x]   No []   Unknown Other, specify:_____________________________________________   Patient risk factors: Smoker? []   Current []   Former [x]   Never If female, currently pregnant? []   Yes [x]   No  Patient Active Problem List   Diagnosis Date Noted  . Environmental allergies 11/15/2018  . Bronchitis 11/15/2018  . Wheezing 11/15/2018  . Cough 11/15/2018  . Chronic pain of right ankle 08/06/2017  . Localized osteoarthritis of right ankle 08/06/2017  . UTI  (urinary tract infection) 11/25/2014  . Syncope 11/25/2014  . Leiomyoma of uterus 04/13/2014  . IBS (irritable bowel syndrome) 04/13/2014  . Amenorrhea due to Depo Provera 04/13/2014    Plan:  []   High risk for COVID-19 with red flags go to ED (with CP, SOB, weak/lightheaded, or fever > 101.5). Call ahead.  []   High risk for COVID-19 but stable will have car visit. Inform provider and coordinate time. Will be completed in afternoon. [x]   No red flags but URI signs or symptoms will go through side door and be seen in dedicated room.  Note: Referral to telemedicine is an appropriate alternative disposition for higher risk but stable. [facility]/e-Visit: [phone].

## 2019-02-24 NOTE — Progress Notes (Signed)
  Subjective:     Patient ID: [name], female   DOB: Sep 12, 1981, 38 y.o.   MRN: [id number]  HPI Patient is seen with mostly postnasal drip and nasal congestive symptoms.  She has had some occasional sneezing and occasional watery itchy eyes.  She thinks may be allergy.  She has some sore throat last Thursday.  No body aches.  No fever.  No chills.  No sick contacts.  She was recently seen with some vertigo and those symptoms overall improved but still has some occasional fleeting vertigo symptoms.  She was treated with antibiotics recently.  No hearing changes.  No ataxia.  No recent travels.  Past Medical History:  Diagnosis Date  . Hypertension   . Sickle cell trait ([place])    No past surgical history on file.  reports that she has never smoked. She has never used smokeless tobacco. She reports that she does not drink alcohol or use drugs. family history includes Diabetes in her maternal aunt; Hypertension in her maternal aunt and mother. Allergies  Allergen Reactions  . Latex Itching     Review of Systems  Constitutional: Negative for chills and fever.  HENT: Positive for congestion and sore throat. Negative for ear discharge, ear pain, sinus pressure and sinus pain.   Respiratory: Negative for cough, shortness of breath and wheezing.        Objective:   Physical Exam Constitutional:      Appearance: She is well-developed.  HENT:     Right Ear: Tympanic membrane normal.     Left Ear: Tympanic membrane normal.     Mouth/Throat:     Mouth: Mucous membranes are moist.     Pharynx: Oropharynx is clear. No oropharyngeal exudate.     Tonsils: No tonsillar exudate.  Neck:     Musculoskeletal: Neck supple.  Cardiovascular:     Rate and Rhythm: Normal rate and regular rhythm.  Pulmonary:     Effort: Pulmonary effort is normal.     Breath sounds: Normal breath sounds. No wheezing or rales.  Lymphadenopathy:     Cervical: No cervical adenopathy.  Neurological:   Mental Status: She is alert.        Assessment:     Rhinitis.  Suspect allergic.    Plan:     -Recommend trial Flonase over-the-counter and can also use Claritin, Allegra, or Zyrtec. -Follow-up for any persistent or worsening symptoms  Eulas Post MD [facility]

## 2019-02-24 NOTE — Patient Instructions (Signed)

## 2019-03-02 ENCOUNTER — Other Ambulatory Visit: Payer: Self-pay | Admitting: Obstetrics & Gynecology

## 2019-03-10 ENCOUNTER — Encounter: Payer: Self-pay | Admitting: Family Medicine

## 2019-03-10 ENCOUNTER — Ambulatory Visit (INDEPENDENT_AMBULATORY_CARE_PROVIDER_SITE_OTHER): Payer: 59 | Admitting: Family Medicine

## 2019-03-10 ENCOUNTER — Other Ambulatory Visit: Payer: Self-pay

## 2019-03-10 DIAGNOSIS — I10 Essential (primary) hypertension: Secondary | ICD-10-CM

## 2019-03-10 DIAGNOSIS — J309 Allergic rhinitis, unspecified: Secondary | ICD-10-CM | POA: Diagnosis not present

## 2019-03-10 NOTE — Progress Notes (Signed)
Virtual Visit via Video Note  I connected with [name] on 03/10/19 at 11:00 AM EDT by a video enabled telemedicine application and verified that I am speaking with the correct person using two identifiers. The video component of this did not work. Patient gave permission to convert to audio only visit.  Location patient: home Location provider:work or home office Persons participating in the virtual visit: patient, provider  I discussed the limitations of evaluation and management by telemedicine and the availability of in person appointments. The patient expressed understanding and agreed to proceed.   HPI:  Follow up. PMH HTN, vertigo, allergic rhinitis. Now is taking claritin and flonase. Reports has been doing pretty good. Symptoms have pretty much resolved with the allergy medication. No vertigo. No CP, SOB, DOE, wheezing.  ROS: See pertinent positives and negatives per HPI.  Past Medical History:  Diagnosis Date  . Hypertension   . Sickle cell trait ([place])     History reviewed. No pertinent surgical history.  Family History  Problem Relation Age of Onset  . Hypertension Mother   . Diabetes Maternal Aunt   . Hypertension Maternal Aunt     SOCIAL HX: see hpi   Current Outpatient Medications:  .  albuterol (PROAIR HFA) 108 (90 Base) MCG/ACT inhaler, Inhale 2 puffs into the lungs every 6 (six) hours as needed for wheezing or shortness of breath., Disp: 1 Inhaler, Rfl: 2 .  amLODipine (NORVASC) 2.5 MG tablet, Take 1 tablet (2.5 mg total) by mouth daily., Disp: 90 tablet, Rfl: 3 .  ibuprofen (ADVIL,MOTRIN) 200 MG tablet, Take 400 mg by mouth every 6 (six) hours as needed for moderate pain., Disp: , Rfl:  .  NECON 0.5/35, 28, 0.5-35 MG-MCG tablet, TAKE 1 TABLET BY MOUTH AT BEDTIME, Disp: 28 tablet, Rfl: 2 .  Prenatal Vit-Fe Fumarate-FA (PRENATAL MULTIVITAMIN) TABS tablet, Take 1 tablet by mouth daily at 12 noon., Disp: , Rfl:  .  montelukast (SINGULAIR) 10 MG tablet, Take 1  tablet (10 mg total) by mouth at bedtime., Disp: 30 tablet, Rfl: 0  EXAM:  VITALS per patient if applicable: reviewed from recent appointment  GENERAL: alert, oriented, no audible acute distress  LUNGS: no audible breathing difficulty  PSYCH/NEURO: pleasant and cooperative, no obvious depression or anxiety, speech and thought processing grossly intact  ASSESSMENT AND PLAN:  Discussed the following assessment and plan:  Essential hypertension  Allergic rhinitis, unspecified seasonality, unspecified trigger  Discussed and reviewed her medications and updated med list Opted to continue antihypertensive, antihistamine and INS for the allergy season Follow up with [name] for [facility] visit in 3 months. She did not feel like needed follow up scheduled in April.   I discussed the assessment and treatment plan with the patient. The patient was provided an opportunity to ask questions and all were answered. The patient agreed with the plan and demonstrated an understanding of the instructions.   The patient was advised to call back or seek an in-person evaluation if the symptoms worsen or if the condition fails to improve as anticipated.   Follow up instructions: Advised assistant Wendie Simmer to help patient arrange the following: -cancel April appointment -schedule TOC visit with Dr. Arlana Pouch in 3 months  I provided 18 minutes of non-face-to-face time during this encounter.   [name], DO

## 2019-04-03 ENCOUNTER — Ambulatory Visit: Payer: 59 | Admitting: Family Medicine

## 2019-05-06 MED FILL — AMLODIPINE 2.5 MG TABLET: 2.5 | 90 days supply | Qty: 90 | Fill #3

## 2019-05-29 ENCOUNTER — Other Ambulatory Visit: Payer: Self-pay

## 2019-06-02 ENCOUNTER — Ambulatory Visit (INDEPENDENT_AMBULATORY_CARE_PROVIDER_SITE_OTHER): Payer: 59 | Admitting: Obstetrics & Gynecology

## 2019-06-02 ENCOUNTER — Encounter: Payer: Self-pay | Admitting: Obstetrics & Gynecology

## 2019-06-02 ENCOUNTER — Other Ambulatory Visit: Payer: Self-pay

## 2019-06-02 VITALS — BP 128/80 | Ht 59.0 in | Wt 154.0 lb

## 2019-06-02 DIAGNOSIS — Z6831 Body mass index (BMI) 31.0-31.9, adult: Secondary | ICD-10-CM | POA: Diagnosis not present

## 2019-06-02 DIAGNOSIS — E6609 Other obesity due to excess calories: Secondary | ICD-10-CM | POA: Diagnosis not present

## 2019-06-02 DIAGNOSIS — N870 Mild cervical dysplasia: Secondary | ICD-10-CM | POA: Diagnosis not present

## 2019-06-02 DIAGNOSIS — Z01419 Encounter for gynecological examination (general) (routine) without abnormal findings: Secondary | ICD-10-CM

## 2019-06-02 DIAGNOSIS — R87610 Atypical squamous cells of undetermined significance on cytologic smear of cervix (ASC-US): Secondary | ICD-10-CM | POA: Diagnosis not present

## 2019-06-02 DIAGNOSIS — Z3041 Encounter for surveillance of contraceptive pills: Secondary | ICD-10-CM

## 2019-06-02 MED ORDER — NECON 0.5/35 (28) 0.5-35 MG-MCG PO TABS: 1.0000 | ORAL_TABLET | Freq: Every day | ORAL | 4 refills | Status: DC

## 2019-06-02 NOTE — Patient Instructions (Signed)
1. Encounter for routine gynecological examination with Papanicolaou smear of cervix Gynecologic exam with stable small uterine fibroids.  Pap test with high-risk HPV done today.  Breast exam normal.  Will see family physician next month with health labs. - PAP,TP IMGw/HPV RNA,rflx [id number]/82  2. Dysplasia of cervix, low grade (CIN 1) ASCUS/HPV HR pos. HPV 16-18-45 negative. Colpo done 02/2018 CIN 1. Pap test with high-risk HPV done today.  3. Encounter for surveillance of contraceptive pills Well on review:.  No contraindication to continue until desires to attempt conception.  Prescription sent to pharmacy.  Preconception counseling done.  Patient already on prenatal vitamins.  Advanced maternal age at 67 currently.  History of incompetent cervix.  Pregnancy loss in the second trimester x1.  History of 2 spontaneous abortions in the first trimester/early second trimester.  Has had a cerclage once.  4. Class 1 obesity due to excess calories without serious comorbidity with body mass index (BMI) of 31.0 to 31.9 in adult Recommend lower calorie/carb diet such as [employer].  Aerobic activities 5 times a week and weightlifting every 2 days.  Other orders - norethindrone-ethinyl estradiol (NECON 0.5/35, 28,) 0.5-35 MG-MCG tablet; Take 1 tablet by mouth at bedtime.  Andi, it was a pleasure seeing you today!  I will inform you of your results as soon as they are available.

## 2019-06-02 NOTE — Progress Notes (Signed)
[place] 1981/12/01 [id number]   History:    38 y.o. G3P1A2L0  Stable boyfriend  RP:  Established patient presenting for annual gyn exam   HPI: Well on Brevicon.  No BTB.  No pelvic pain.  Would like to attempt conception soon.  Known small asymptomatic uterine fibroids.  H/O Cervical incompetence with 2nd trimester loss.  Cerclage done then with next pregnancy, but had a miscarriage.  ASCUS/HPV HR pos 05/2018. HPV 16-18-45 negative. Colpo done 02/2018 CIN 1.  No pain with IC.  Breasts normal. BMI 31.10.  Needs to exercise more.  Will see Fam MD next month with Health labs.  Past medical history,surgical history, family history and social history were all reviewed and documented in the EPIC chart.  Gynecologic History No LMP recorded (lmp unknown). (Menstrual status: Oral contraceptives). Contraception: OCP (estrogen/progesterone) Last Pap: 05/2018. Results were: ASCUS/HPV HR pos Last mammogram: Never Bone Density: Never Colonoscopy: Never  Obstetric History OB History  Gravida Para Term Preterm AB Living  3 1 0 1 2 0  SAB TAB Ectopic Multiple Live Births  2            # Outcome Date GA Lbr Len/2nd Weight Sex Delivery Anes PTL Lv  3 Preterm 2010 [redacted]w[redacted]d   M Vag-Spont        Birth Comments: cerclage, placenta previa  2 SAB 2003 [redacted]w[redacted]d   F      1 SAB 2001 [redacted]w[redacted]d   M         ROS: A ROS was performed and pertinent positives and negatives are included in the history.  GENERAL: No fevers or chills. HEENT: No change in vision, no earache, sore throat or sinus congestion. NECK: No pain or stiffness. CARDIOVASCULAR: No chest pain or pressure. No palpitations. PULMONARY: No shortness of breath, cough or wheeze. GASTROINTESTINAL: No abdominal pain, nausea, vomiting or diarrhea, melena or bright red blood per rectum. GENITOURINARY: No urinary frequency, urgency, hesitancy or dysuria. MUSCULOSKELETAL: No joint or muscle pain, no back pain, no recent trauma. DERMATOLOGIC: No rash, no  itching, no lesions. ENDOCRINE: No polyuria, polydipsia, no heat or cold intolerance. No recent change in weight. HEMATOLOGICAL: No anemia or easy bruising or bleeding. NEUROLOGIC: No headache, seizures, numbness, tingling or weakness. PSYCHIATRIC: No depression, no loss of interest in normal activity or change in sleep pattern.     Exam:   BP 128/80 (BP Location: Right Arm, Patient Position: Sitting, Cuff Size: Normal)   Ht 4\' 11"  (1.499 m)   Wt 154 lb (69.9 kg)   LMP  (LMP Unknown) Comment: takes bc cont  BMI 31.10 kg/m   Body mass index is 31.1 kg/m.  General appearance : Well developed well nourished female. No acute distress HEENT: Eyes: no retinal hemorrhage or exudates,  Neck supple, trachea midline, no carotid bruits, no thyroidmegaly Lungs: Clear to auscultation, no rhonchi or wheezes, or rib retractions  Heart: Regular rate and rhythm, no murmurs or gallops Breast:Examined in sitting and supine position were symmetrical in appearance, no palpable masses or tenderness,  no skin retraction, no nipple inversion, no nipple discharge, no skin discoloration, no axillary or supraclavicular lymphadenopathy Abdomen: no palpable masses or tenderness, no rebound or guarding Extremities: no edema or skin discoloration or tenderness  Pelvic: Vulva: Normal             Vagina: No gross lesions or discharge  Cervix: No gross lesions or discharge.  Pap/HPV HR done.  Uterus  AV, mild increase in  volume, nodular, non-tender and mobile  Adnexa  Without masses or tenderness  Anus: Normal   Assessment/Plan:  38 y.o. female for annual exam   1. Encounter for routine gynecological examination with Papanicolaou smear of cervix Gynecologic exam with stable small uterine fibroids.  Pap test with high-risk HPV done today.  Breast exam normal.  Will see family physician next month with health labs. - PAP,TP IMGw/HPV RNA,rflx [id number],79/43  2. Dysplasia of cervix, low grade (CIN 1) ASCUS/HPV HR  pos. HPV 16-18-45 negative. Colpo done 02/2018 CIN 1. Pap test with high-risk HPV done today.  3. Encounter for surveillance of contraceptive pills Well on review:.  No contraindication to continue until desires to attempt conception.  Prescription sent to pharmacy.  Preconception counseling done.  Patient already on prenatal vitamins.  Advanced maternal age at 56 currently.  History of incompetent cervix.  Pregnancy loss in the second trimester x1.  History of 2 spontaneous abortions in the first trimester/early second trimester.  Has had a cerclage once.  4. Class 1 obesity due to excess calories without serious comorbidity with body mass index (BMI) of 31.0 to 31.9 in adult Recommend lower calorie/carb diet such as [employer].  Aerobic activities 5 times a week and weightlifting every 2 days.  Other orders - norethindrone-ethinyl estradiol (NECON 0.5/35, 28,) 0.5-35 MG-MCG tablet; Take 1 tablet by mouth at bedtime.  Princess Bruins MD, 11:40 AM 06/02/2019

## 2019-06-03 LAB — PAP, TP IMAGING W/ HPV RNA, RFLX HPV TYPE 16,18/45: HPV DNA High Risk: DETECTED — AB

## 2019-06-05 ENCOUNTER — Other Ambulatory Visit: Payer: Self-pay | Admitting: Obstetrics & Gynecology

## 2019-06-05 MED ORDER — TINIDAZOLE 500 MG PO TABS: ORAL_TABLET | ORAL | 0 refills | Status: DC

## 2019-06-16 ENCOUNTER — Other Ambulatory Visit: Payer: Self-pay | Admitting: Obstetrics & Gynecology

## 2019-06-26 NOTE — Progress Notes (Signed)
[name] DOB: 1981/03/18 Encounter date: 06/27/2019  This is a 38 y.o. female who presents to establish care. Chief Complaint  Patient presents with  . Establish Care  . Dizziness    History of present illness: Has had some vertigo issues going on. Was taking claritin for vertigo. Some echo/water sound in ears. A couple weeks ago would start to hear echo in right ear. Then was having some problems with food/heartburn burning in throat. Wakes her up at night. Never taken anything for heart burn in past. Has used otc tums on occasion. Has been like this for years. Gets upper abd and throat discomfort. Also more  Gassy - might be more lactose related.   Vertigo hasn't been as bad as it was in march - just notes if moving too fast.   IBS: lactose intolerant but overall bowels working well.   Impaired fasting glucose:noted on previous bloodwork.   HTN: not checking blood pressure regularly at home. Will occasionally check at work. States that it was higher when she was at obgyn office.   Follows with obgyn regularly.   Bronchitis is doing better since not living in mom's house. Just bought house and with living with mom who was smoking she kept flaring.    Past Medical History:  Diagnosis Date  . Hypertension   . IBS (irritable bowel syndrome) 04/13/2014  . Sickle cell trait ([place])    History reviewed. No pertinent surgical history. Allergies  Allergen Reactions  . Latex Itching   Current Meds  Medication Sig  . albuterol (PROAIR HFA) 108 (90 Base) MCG/ACT inhaler Inhale 2 puffs into the lungs every 6 (six) hours as needed for wheezing or shortness of breath.  Marland Kitchen amLODipine (NORVASC) 2.5 MG tablet Take 1 tablet (2.5 mg total) by mouth daily.  Marland Kitchen ibuprofen (ADVIL,MOTRIN) 200 MG tablet Take 400 mg by mouth every 6 (six) hours as needed for moderate pain.  Marland Kitchen NECON 0.5/35, 28, 0.5-35 MG-MCG tablet TAKE 1 TABLET BY MOUTH AT BEDTIME  . Prenatal Vit-Fe Fumarate-FA (PRENATAL  MULTIVITAMIN) TABS tablet Take 1 tablet by mouth daily at 12 noon.   Social History   Tobacco Use  . Smoking status: Never Smoker  . Smokeless tobacco: Never Used  Substance Use Topics  . Alcohol use: No   Family History  Problem Relation Age of Onset  . Hypertension Mother   . Diabetes Maternal Aunt   . Hypertension Maternal Aunt      Review of Systems  Constitutional: Negative for chills, fatigue and fever.  Respiratory: Negative for cough, chest tightness, shortness of breath and wheezing.   Cardiovascular: Negative for chest pain, palpitations and leg swelling.    Objective:  BP 128/78 (BP Location: Left Arm, Patient Position: Sitting, Cuff Size: Normal)   Pulse 74   Temp 98.3 F (36.8 C) (Temporal)   Ht 4\' 11"  (1.499 m)   Wt 153 lb 12.8 oz (69.8 kg)   LMP  (LMP Unknown) Comment: takes bc cont  SpO2 97%   BMI 31.06 kg/m   Weight: 153 lb 12.8 oz (69.8 kg)   BP Readings from Last 3 Encounters:  06/27/19 128/78  06/02/19 128/80  02/24/19 120/80   Wt Readings from Last 3 Encounters:  06/27/19 153 lb 12.8 oz (69.8 kg)  06/02/19 154 lb (69.9 kg)  02/24/19 157 lb 4.8 oz (71.4 kg)    Physical Exam Constitutional:      General: She is not in acute distress.    Appearance: She  is well-developed.  HENT:     Right Ear: Tympanic membrane, ear canal and external ear normal.     Left Ear: Tympanic membrane, ear canal and external ear normal.  Cardiovascular:     Rate and Rhythm: Normal rate and regular rhythm.     Heart sounds: Normal heart sounds. No murmur. No friction rub.  Pulmonary:     Effort: Pulmonary effort is normal. No respiratory distress.     Breath sounds: Normal breath sounds. No wheezing or rales.  Abdominal:     General: Abdomen is flat.     Palpations: Abdomen is soft.     Tenderness: There is abdominal tenderness in the epigastric area.     Comments: minimal  Musculoskeletal:     Right lower leg: No edema.     Left lower leg: No edema.   Neurological:     Mental Status: She is alert and oriented to person, place, and time.  Psychiatric:        Behavior: Behavior normal.     Assessment/Plan:  1. Lactose intolerance Does ok managing this; knows what foods to avoid.  2. Gastroesophageal reflux disease, esophagitis presence not specified Trial x 1 month omeprazole.  We reviewed dietary triggers to avoid.  I suggested that she take medication about 30 minutes prior to dinner to help with evening symptoms of reflux.  If symptoms do not improve on this medication, do not resolve with these changes, she was instructed to let us know. - omeprazole (PRILOSEC) 20 MG capsule; Take 1 capsule (20 mg total) by mouth daily.  Dispense: 30 capsule; Refill: 2  3. Vertigo This is improved.  Suggested using Flonase on a daily basis to help with any eustachian tube dysfunction.  Let us know if any worsening of vertigo. - TSH; Future - TSH  4. Allergic rhinitis, unspecified seasonality, unspecified trigger Add Flonase.  Continue other allergy medications.  5. Essential hypertension Stable.  Continue current medication. - Comprehensive metabolic panel; Future - Comprehensive metabolic panel  6. Hyperglycemia  - Hemoglobin A1c; Future - Hemoglobin A1c  7. Lipid screening  - Lipid panel; Future - Lipid panel  Return MyChart update in 1 month's time.  Follow-up pending this.Micheline Rough, MD

## 2019-06-27 ENCOUNTER — Ambulatory Visit: Payer: 59 | Admitting: Family Medicine

## 2019-06-27 ENCOUNTER — Other Ambulatory Visit: Payer: Self-pay

## 2019-06-27 ENCOUNTER — Encounter: Payer: Self-pay | Admitting: Family Medicine

## 2019-06-27 VITALS — BP 128/78 | HR 74 | Temp 98.3°F | Ht 59.0 in | Wt 153.8 lb

## 2019-06-27 DIAGNOSIS — K219 Gastro-esophageal reflux disease without esophagitis: Secondary | ICD-10-CM

## 2019-06-27 DIAGNOSIS — E739 Lactose intolerance, unspecified: Secondary | ICD-10-CM | POA: Diagnosis not present

## 2019-06-27 DIAGNOSIS — R42 Dizziness and giddiness: Secondary | ICD-10-CM

## 2019-06-27 DIAGNOSIS — R739 Hyperglycemia, unspecified: Secondary | ICD-10-CM | POA: Diagnosis not present

## 2019-06-27 DIAGNOSIS — Z13220 Encounter for screening for lipoid disorders: Secondary | ICD-10-CM | POA: Diagnosis not present

## 2019-06-27 DIAGNOSIS — J309 Allergic rhinitis, unspecified: Secondary | ICD-10-CM | POA: Diagnosis not present

## 2019-06-27 DIAGNOSIS — I10 Essential (primary) hypertension: Secondary | ICD-10-CM | POA: Diagnosis not present

## 2019-06-27 LAB — TSH: TSH: 0.72 u[IU]/mL (ref 0.35–4.50)

## 2019-06-27 LAB — COMPREHENSIVE METABOLIC PANEL
ALT: 9 U/L (ref 0–35)
AST: 11 U/L (ref 0–37)
Albumin: 4.3 g/dL (ref 3.5–5.2)
Alkaline Phosphatase: 64 U/L (ref 39–117)
BUN: 9 mg/dL (ref 6–23)
CO2: 28 mEq/L (ref 19–32)
Calcium: 9.3 mg/dL (ref 8.4–10.5)
Chloride: 102 mEq/L (ref 96–112)
Creatinine, Ser: 0.86 mg/dL (ref 0.40–1.20)
GFR: 89.49 mL/min (ref >60.00)
Glucose, Bld: 81 mg/dL (ref 70–99)
Potassium: 4.5 mEq/L (ref 3.5–5.1)
Sodium: 137 mEq/L (ref 135–145)
Total Bilirubin: 0.3 mg/dL (ref 0.2–1.2)
Total Protein: 7.5 g/dL (ref 6.0–8.3)

## 2019-06-27 LAB — LIPID PANEL
Cholesterol: 163 mg/dL (ref 0–200)
HDL: 56.7 mg/dL (ref >39.00)
LDL Cholesterol: 86 mg/dL (ref 0–99)
NonHDL: 106.66
Total CHOL/HDL Ratio: 3
Triglycerides: 102 mg/dL (ref 0.0–149.0)
VLDL: 20.4 mg/dL (ref 0.0–40.0)

## 2019-06-27 LAB — HEMOGLOBIN A1C: Hgb A1c MFr Bld: 5.6 % (ref 4.6–6.5)

## 2019-06-27 MED ORDER — OMEPRAZOLE 20 MG PO CPDR: 20.0000 mg | DELAYED_RELEASE_CAPSULE | Freq: Every day | ORAL | 2 refills | Status: DC

## 2019-06-27 NOTE — Patient Instructions (Addendum)
Take the omeprazole about 30 minutes prior to dinner. Try this for a month and then let me know how symptoms are doing.   Restart flonase daily.   Food Choices for Gastroesophageal Reflux Disease, Adult When you have gastroesophageal reflux disease (GERD), the foods you eat and your eating habits are very important. Choosing the right foods can help ease your discomfort. Think about working with a nutrition specialist (dietitian) to help you make good choices. What are tips for following this plan?  Meals  Choose healthy foods that are low in fat, such as fruits, vegetables, whole grains, low-fat dairy products, and lean meat, fish, and poultry.  Eat small meals often instead of 3 large meals a day. Eat your meals slowly, and in a place where you are relaxed. Avoid bending over or lying down until 2-3 hours after eating.  Avoid eating meals 2-3 hours before bed.  Avoid drinking a lot of liquid with meals.  Cook foods using methods other than frying. Bake, grill, or broil food instead.  Avoid or limit: ? Chocolate. ? Peppermint or spearmint. ? Alcohol. ? Pepper. ? Black and decaffeinated coffee. ? Black and decaffeinated tea. ? Bubbly (carbonated) soft drinks. ? Caffeinated energy drinks and soft drinks.  Limit high-fat foods such as: ? Fatty meat or fried foods. ? Whole milk, cream, butter, or ice cream. ? Nuts and nut butters. ? Pastries, donuts, and sweets made with butter or shortening.  Avoid foods that cause symptoms. These foods may be different for everyone. Common foods that cause symptoms include: ? Tomatoes. ? Oranges, lemons, and limes. ? Peppers. ? Spicy food. ? Onions and garlic. ? Vinegar. Lifestyle  Maintain a healthy weight. Ask your doctor what weight is healthy for you. If you need to lose weight, work with your doctor to do so safely.  Exercise for at least 30 minutes for 5 or more days each week, or as told by your doctor.  Wear loose-fitting  clothes.  Do not smoke. If you need help quitting, ask your doctor.  Sleep with the head of your bed higher than your feet. Use a wedge under the mattress or blocks under the bed frame to raise the head of the bed. Summary  When you have gastroesophageal reflux disease (GERD), food and lifestyle choices are very important in easing your symptoms.  Eat small meals often instead of 3 large meals a day. Eat your meals slowly, and in a place where you are relaxed.  Limit high-fat foods such as fatty meat or fried foods.  Avoid bending over or lying down until 2-3 hours after eating.  Avoid peppermint and spearmint, caffeine, alcohol, and chocolate. This information is not intended to replace advice given to you by your health care provider. Make sure you discuss any questions you have with your health care provider. Document Released: 05/28/2012 Document Revised: 03/20/2019 Document Reviewed: 01/02/2017 Elsevier Patient Education  2020 [employer].

## 2019-07-24 ENCOUNTER — Other Ambulatory Visit: Payer: Self-pay

## 2019-07-25 ENCOUNTER — Ambulatory Visit: Payer: 59 | Admitting: Obstetrics & Gynecology

## 2019-07-25 ENCOUNTER — Encounter: Payer: Self-pay | Admitting: Obstetrics & Gynecology

## 2019-07-25 VITALS — BP 140/90

## 2019-07-25 DIAGNOSIS — N870 Mild cervical dysplasia: Secondary | ICD-10-CM

## 2019-07-25 DIAGNOSIS — R87810 Cervical high risk human papillomavirus (HPV) DNA test positive: Secondary | ICD-10-CM

## 2019-07-25 DIAGNOSIS — R87610 Atypical squamous cells of undetermined significance on cytologic smear of cervix (ASC-US): Secondary | ICD-10-CM | POA: Diagnosis not present

## 2019-07-25 NOTE — Progress Notes (Signed)
    [name] 11-17-1981 [id number]        38 y.o.  G3P0120  Boyfriend  RP: ASCUS/HPV HR positive in 05/2019 for Colposcopy  HPI: ASCUS/HPV HR positive 05/2019.  Previous HPV 16-18-45 negative.  H/O CIN 1 02/2018 and 2018.  Stopped BCPs, attempting conception.   OB History  Gravida Para Term Preterm AB Living  3 1 0 1 2 0  SAB TAB Ectopic Multiple Live Births  2            # Outcome Date GA Lbr Len/2nd Weight Sex Delivery Anes PTL Lv  3 Preterm 2010 [redacted]w[redacted]d   M Vag-Spont        Birth Comments: cerclage, placenta previa  2 SAB 2003 [redacted]w[redacted]d   F      1 SAB 2001 [redacted]w[redacted]d   M        Past medical history,surgical history, problem list, medications, allergies, family history and social history were all reviewed and documented in the EPIC chart.   Directed ROS with pertinent positives and negatives documented in the history of present illness/assessment and plan.  Exam:  Vitals:   07/25/19 0901  BP: 140/90   General appearance:  Normal  Colposcopy Procedure Note [name] 07/25/2019  Indications:  ASCUS/HPV HR positive/H/O CIN 1  Procedure Details  The risks and benefits of the procedure and Verbal informed consent obtained.  Speculum placed in vagina and excellent visualization of cervix achieved, cervix swabbed x 3 with acetic acid solution.  Findings:  Cervix colposcopy: Physical Exam Genitourinary:       Vaginal colposcopy: Normal  Vulvar colposcopy: Normal  Perirectal colposcopy: Grossly normal  The cervix was sprayed with Hurricane before performing the cervical biopsies.  Specimens:  Cervical Bx at 12 and 3 O'Clock  Complications: None, hemostasis with Silver Nitrate . Plan:  Management per Bx results.    Assessment/Plan:  38 y.o. G3P0120   1. ASCUS with positive high risk HPV cervical ASCUS with positive high-risk HPV June 2020 as well as June 2019.  History of CIN-08 February 2018.  Colposcopy procedure reviewed.  Colposcopy  findings discussed with patient.  Management per results.  2. Dysplasia of cervix, low grade (CIN 1) As above.  Other orders - Pathology Report ([facility])  Princess Bruins MD, 9:12 AM 07/25/2019

## 2019-07-25 NOTE — Patient Instructions (Signed)
1. ASCUS with positive high risk HPV cervical ASCUS with positive high-risk HPV June 2020 as well as June 2019.  History of CIN-08 February 2018.  Colposcopy procedure reviewed.  Colposcopy findings discussed with patient.  Management per results.  2. Dysplasia of cervix, low grade (CIN 1) As above.  Other orders - Pathology Report ([facility])  [name], it was a pleasure seeing you today!  I will inform you of your results as soon as they are available.

## 2019-07-29 LAB — PATHOLOGY REPORT

## 2019-07-29 LAB — TISSUE PATH REPORT

## 2019-08-04 ENCOUNTER — Other Ambulatory Visit: Payer: Self-pay | Admitting: Family Medicine

## 2019-08-04 MED FILL — AMLODIPINE 2.5 MG TABLET: 2.5 | 90 days supply | Qty: 90 | Fill #0

## 2019-08-06 MED FILL — OMEPRAZOLE 20 MG CAP: 20 | 30 days supply | Qty: 30 | Fill #0

## 2019-10-08 MED FILL — OMEPRAZOLE 20 MG CAP: 20 | 30 days supply | Qty: 30 | Fill #1

## 2019-11-07 ENCOUNTER — Other Ambulatory Visit: Payer: Self-pay | Admitting: Family Medicine

## 2019-11-10 MED FILL — AMLODIPINE 2.5 MG TABLET: 2.5 | 90 days supply | Qty: 90 | Fill #0

## 2019-11-10 NOTE — Telephone Encounter (Signed)
Pt calling to check on this and states that she took her last pill this morning.

## 2020-01-23 ENCOUNTER — Other Ambulatory Visit: Payer: Self-pay

## 2020-01-26 ENCOUNTER — Ambulatory Visit: Payer: 59 | Admitting: Women's Health

## 2020-01-27 ENCOUNTER — Telehealth: Payer: Self-pay | Admitting: Family Medicine

## 2020-01-27 ENCOUNTER — Telehealth (INDEPENDENT_AMBULATORY_CARE_PROVIDER_SITE_OTHER): Payer: 59 | Admitting: Family Medicine

## 2020-01-27 ENCOUNTER — Other Ambulatory Visit: Payer: Self-pay

## 2020-01-27 ENCOUNTER — Encounter: Payer: Self-pay | Admitting: Family Medicine

## 2020-01-27 DIAGNOSIS — J019 Acute sinusitis, unspecified: Secondary | ICD-10-CM | POA: Diagnosis not present

## 2020-01-27 DIAGNOSIS — Z7189 Other specified counseling: Secondary | ICD-10-CM | POA: Diagnosis not present

## 2020-01-27 DIAGNOSIS — Z7185 Encounter for immunization safety counseling: Secondary | ICD-10-CM

## 2020-01-27 DIAGNOSIS — K219 Gastro-esophageal reflux disease without esophagitis: Secondary | ICD-10-CM | POA: Diagnosis not present

## 2020-01-27 MED ORDER — AMLODIPINE BESYLATE 2.5 MG PO TABS: 2.5000 mg | ORAL_TABLET | Freq: Every day | ORAL | 0 refills | Status: DC

## 2020-01-27 MED ORDER — AMOXICILLIN-POT CLAVULANATE 875-125 MG PO TABS: 1.0000 | ORAL_TABLET | Freq: Two times a day (BID) | ORAL | 0 refills | Status: DC

## 2020-01-27 MED ORDER — OMEPRAZOLE 20 MG PO CPDR: 20.0000 mg | DELAYED_RELEASE_CAPSULE | Freq: Every day | ORAL | 0 refills | Status: DC

## 2020-01-27 MED FILL — AMLODIPINE 2.5 MG TABLET: 2.5 | 90 days supply | Qty: 90 | Fill #0

## 2020-01-27 MED FILL — AMOX-CLAV 875-125 MG TABLET: 875-125 | 10 days supply | Qty: 20 | Fill #0

## 2020-01-27 MED FILL — OMEPRAZOLE 20 MG CAP: 20 | 30 days supply | Qty: 30 | Fill #0

## 2020-01-27 NOTE — Telephone Encounter (Signed)
Medication Refill: Amlodipine  Pharmacy: [facility]  Phone:

## 2020-01-27 NOTE — Progress Notes (Signed)
Virtual Visit via Video Note  I connected with [name]  on 01/27/20 at  4:40 PM EST by a video enabled telemedicine application and verified that I am speaking with the correct person using two identifiers.  Location patient: home Location provider:work or home office Persons participating in the virtual visit: patient, provider  I discussed the limitations of evaluation and management by telemedicine and the availability of in person appointments. The patient expressed understanding and agreed to proceed.   HPI:  Acute visit for sinus issues: -started 2 weeks ago -symptoms include: sinus congestion, body aches, occ vertigo when it started, yellow phlegm, cough, sneezing last week, sore throat and drainage, some yellow congestion now, HA -trying some vics lozenges -denies loss of taste or smell, SOB, wheezing -works as a Scientist, water quality at [employer], sees a lot of people, denies known sick contacts, reports goes to work only and otherwise does not socialize outside of work. Reports wears appropriate PPE at all times at work -has been offered the COVID19 vaccine, but had questions and fears about getting the vaccine -FDLMP 4 days ago Also requests refill on prilosec. Uses for GERD, reports stable.  ROS: See pertinent positives and negatives per HPI.  Past Medical History:  Diagnosis Date  . Hypertension   . IBS (irritable bowel syndrome) 04/13/2014  . Sickle cell trait ([place])     No past surgical history on file.  Family History  Problem Relation Age of Onset  . Hypertension Mother   . Diabetes Maternal Aunt   . Hypertension Maternal Aunt     SOCIAL HX: see hpi   Current Outpatient Medications:  .  albuterol (PROAIR HFA) 108 (90 Base) MCG/ACT inhaler, Inhale 2 puffs into the lungs every 6 (six) hours as needed for wheezing or shortness of breath., Disp: 1 Inhaler, Rfl: 2 .  ibuprofen (ADVIL,MOTRIN) 200 MG tablet, Take 400 mg by mouth every 6 (six) hours as needed for moderate  pain., Disp: , Rfl:  .  omeprazole (PRILOSEC) 20 MG capsule, Take 1 capsule (20 mg total) by mouth daily., Disp: 30 capsule, Rfl: 0 .  Prenatal Vit-Fe Fumarate-FA (PRENATAL MULTIVITAMIN) TABS tablet, Take 1 tablet by mouth daily at 12 noon., Disp: , Rfl:  .  amLODipine (NORVASC) 2.5 MG tablet, Take 1 tablet (2.5 mg total) by mouth daily., Disp: 90 tablet, Rfl: 0 .  amoxicillin-clavulanate (AUGMENTIN) 875-125 MG tablet, Take 1 tablet by mouth 2 (two) times daily., Disp: 20 tablet, Rfl: 0 .  montelukast (SINGULAIR) 10 MG tablet, Take 1 tablet (10 mg total) by mouth at bedtime., Disp: 30 tablet, Rfl: 0  EXAM:  VITALS per patient if applicable:  GENERAL: alert, oriented, appears well and in no acute distress  HEENT: atraumatic, conjunttiva clear, no obvious abnormalities on inspection of external nose and ears, moist mucus membranes, denies sinus TTP  NECK: normal movements of the head and neck, denies LAD in the neck and has normal chin to chest and ext  LUNGS: on inspection no signs of respiratory distress, breathing rate appears normal, no obvious gross SOB, gasping or wheezing  CV: no obvious cyanosis  MS: moves all visible extremities without noticeable abnormality  PSYCH/NEURO: pleasant and cooperative, no obvious depression or anxiety, speech and thought processing grossly intact  ASSESSMENT AND PLAN:  Discussed the following assessment and plan: -we discussed possible serious and likely etiologies, options for evaluation and workup, limitations of telemedicine visit vs in person visit, treatment, treatment risks and precautions. Pt prefers to treat via telemedicine  empirically rather then risking or undertaking an in person visit at this moment. Patient agrees to seek prompt in person care if worsening, new symptoms arise, or if is not improving with treatment.   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 The patient was provided an opportunity to ask questions and all were answered. The patient agreed with the plan and demonstrated an understanding of the instructions.   The patient was advised to call back or seek an in-person evaluation if the symptoms worsen or if the condition fails to improve as anticipated.   [name], DO

## 2020-01-27 NOTE — Telephone Encounter (Signed)
Rx done. 

## 2020-01-27 NOTE — Patient Instructions (Signed)
-  I sent the medication(s) we discussed to your pharmacy: Meds ordered this encounter  Medications  . amoxicillin-clavulanate (AUGMENTIN) 875-125 MG tablet    Sig: Take 1 tablet by mouth 2 (two) times daily.    Dispense:  20 tablet    Refill:  0   Let health at work know about your symptoms right away. Stay home when you are feeling sick.  Please let us know if you have any questions or concerns regarding this prescription.  I hope you are feeling better soon! Seek care promptly if your symptoms worsen, new concerns arise or you are not improving with treatment.

## 2020-02-02 ENCOUNTER — Ambulatory Visit: Payer: 59 | Admitting: Women's Health

## 2020-02-20 ENCOUNTER — Other Ambulatory Visit: Payer: Self-pay

## 2020-02-20 ENCOUNTER — Encounter: Payer: Self-pay | Admitting: Obstetrics & Gynecology

## 2020-02-20 ENCOUNTER — Ambulatory Visit (INDEPENDENT_AMBULATORY_CARE_PROVIDER_SITE_OTHER): Payer: 59 | Admitting: Obstetrics & Gynecology

## 2020-02-20 VITALS — BP 126/74

## 2020-02-20 DIAGNOSIS — R87610 Atypical squamous cells of undetermined significance on cytologic smear of cervix (ASC-US): Secondary | ICD-10-CM

## 2020-02-20 DIAGNOSIS — B373 Candidiasis of vulva and vagina: Secondary | ICD-10-CM | POA: Diagnosis not present

## 2020-02-20 DIAGNOSIS — Z113 Encounter for screening for infections with a predominantly sexual mode of transmission: Secondary | ICD-10-CM | POA: Diagnosis not present

## 2020-02-20 DIAGNOSIS — N870 Mild cervical dysplasia: Secondary | ICD-10-CM | POA: Diagnosis not present

## 2020-02-20 DIAGNOSIS — B3731 Acute candidiasis of vulva and vagina: Secondary | ICD-10-CM

## 2020-02-20 DIAGNOSIS — R87810 Cervical high risk human papillomavirus (HPV) DNA test positive: Secondary | ICD-10-CM | POA: Diagnosis not present

## 2020-02-20 MED ORDER — FLUCONAZOLE 150 MG PO TABS: 150.0000 mg | ORAL_TABLET | Freq: Every day | ORAL | 2 refills | Status: AC

## 2020-02-20 MED FILL — FLUCONAZOLE 150 MG TABS: 150 | 3 days supply | Qty: 3 | Fill #0

## 2020-02-20 NOTE — Progress Notes (Signed)
    [name] [birth year] [id number]        39 y.o.  [id number]   RP: Repeat Pap test at 6 months  HPI: Colposcopy August 2020 showing mild atypia, not reaching dysplasia.  Complains of increased vaginal discharge with itching.   OB History  Gravida Para Term Preterm AB Living  3 1 0 1 2 0  SAB TAB Ectopic Multiple Live Births  2            # Outcome Date GA Lbr Len/2nd Weight Sex Delivery Anes PTL Lv  3 Preterm 2010 [redacted]w[redacted]d   M Vag-Spont        Birth Comments: cerclage, placenta previa  2 SAB 2003 [redacted]w[redacted]d   F      1 SAB 2001 [redacted]w[redacted]d   M        Past medical history,surgical history, problem list, medications, allergies, family history and social history were all reviewed and documented in the EPIC chart.   Directed ROS with pertinent positives and negatives documented in the history of present illness/assessment and plan.  Exam:  Vitals:   02/20/20 1007  BP: 126/74   General appearance:  Normal  Abdomen: Normal  Gynecologic exam: Vulva normal.  Speculum: Cervix and vagina normal.  Pap test with high-risk HPV done.  Increased vaginal discharge typical of yeast.   Assessment/Plan:  39 y.o. G3P0120   1. Dysplasia of cervix, low grade (CIN 1) History of CIN-1, but last colposcopy showed mild atypia not reaching dysplasia.  Pap test with high-risk HPV done today.  2. ASCUS with positive high risk HPV cervical  3. Screen for STD (sexually transmitted disease) Condom use recommended. - HIV antibody (with reflex) - RPR - Hepatitis C Antibody - Hepatitis B Surface AntiGEN  4. Yeast vaginitis Yeast vaginitis per exam and symptoms.  Will treat with fluconazole 150 mg 1 tablet daily for 3 days.  Precautions reviewed.  Other orders - fluconazole (DIFLUCAN) 150 MG tablet; Take 1 tablet (150 mg total) by mouth daily for 3 days.  Princess Bruins MD, 10:15 AM 02/20/2020

## 2020-02-21 ENCOUNTER — Encounter: Payer: Self-pay | Admitting: Obstetrics & Gynecology

## 2020-02-21 NOTE — Patient Instructions (Signed)
1. Dysplasia of cervix, low grade (CIN 1) History of CIN-1, but last colposcopy showed mild atypia not reaching dysplasia.  Pap test with high-risk HPV done today.  2. ASCUS with positive high risk HPV cervical  3. Screen for STD (sexually transmitted disease) Condom use recommended. - HIV antibody (with reflex) - RPR - Hepatitis C Antibody - Hepatitis B Surface AntiGEN  4. Yeast vaginitis Yeast vaginitis per exam and symptoms.  Will treat with fluconazole 150 mg 1 tablet daily for 3 days.  Precautions reviewed.  Other orders - fluconazole (DIFLUCAN) 150 MG tablet; Take 1 tablet (150 mg total) by mouth daily for 3 days.  [name], it was a pleasure seeing you today!  I will inform you of your results as soon as they are available.

## 2020-02-23 LAB — RPR: RPR Ser Ql: NONREACTIVE

## 2020-02-23 LAB — HEPATITIS B SURFACE ANTIGEN: Hepatitis B Surface Ag: NONREACTIVE

## 2020-02-23 LAB — HIV ANTIBODY (ROUTINE TESTING W REFLEX): HIV 1&2 Ab, 4th Generation: NONREACTIVE

## 2020-02-23 LAB — HEPATITIS C ANTIBODY
Hepatitis C Ab: NONREACTIVE
SIGNAL TO CUT-OFF: 0.01 (ref <1.00)

## 2020-02-24 LAB — PAP IG, CT-NG NAA, HPV HIGH-RISK
C. trachomatis RNA, TMA: NOT DETECTED
HPV DNA High Risk: DETECTED — AB
N. gonorrhoeae RNA, TMA: NOT DETECTED

## 2020-04-26 ENCOUNTER — Telehealth: Payer: Self-pay | Admitting: Family Medicine

## 2020-04-26 DIAGNOSIS — K219 Gastro-esophageal reflux disease without esophagitis: Secondary | ICD-10-CM

## 2020-04-26 MED ORDER — AMLODIPINE BESYLATE 2.5 MG PO TABS: 2.5000 mg | ORAL_TABLET | Freq: Every day | ORAL | 0 refills | Status: DC

## 2020-04-26 MED ORDER — OMEPRAZOLE 20 MG PO CPDR: 20.0000 mg | DELAYED_RELEASE_CAPSULE | Freq: Every day | ORAL | 0 refills | Status: DC

## 2020-04-26 MED FILL — AMLODIPINE 2.5 MG TABLET: 2.5 | 30 days supply | Qty: 30 | Fill #0

## 2020-04-26 NOTE — Addendum Note (Signed)
Addended by: Agnes Lawrence on: 04/26/2020 03:36 PM   Modules accepted: Orders

## 2020-04-26 NOTE — Telephone Encounter (Signed)
Rx done. 

## 2020-04-26 NOTE — Telephone Encounter (Signed)
Medication Refill: Amlodipine Omeprazole (3 days left)   Pharmacy: [facility] FAX:  [phone]

## 2020-04-30 ENCOUNTER — Other Ambulatory Visit: Payer: Self-pay | Admitting: Family Medicine

## 2020-04-30 DIAGNOSIS — K219 Gastro-esophageal reflux disease without esophagitis: Secondary | ICD-10-CM

## 2020-04-30 MED FILL — OMEPRAZOLE 20 MG CAP: 20 | 30 days supply | Qty: 30 | Fill #0

## 2020-05-25 ENCOUNTER — Other Ambulatory Visit: Payer: Self-pay | Admitting: Family Medicine

## 2020-05-25 DIAGNOSIS — K219 Gastro-esophageal reflux disease without esophagitis: Secondary | ICD-10-CM

## 2020-05-25 MED ORDER — OMEPRAZOLE 20 MG PO CPDR: 20.0000 mg | DELAYED_RELEASE_CAPSULE | Freq: Every day | ORAL | 0 refills | Status: DC

## 2020-05-25 MED ORDER — AMLODIPINE BESYLATE 2.5 MG PO TABS: 2.5000 mg | ORAL_TABLET | Freq: Every day | ORAL | 0 refills | Status: DC

## 2020-05-25 MED FILL — AMLODIPINE 2.5 MG TABLET: 2.5 | 30 days supply | Qty: 30 | Fill #0

## 2020-05-25 MED FILL — OMEPRAZOLE 20 MG CAP: 20 | 30 days supply | Qty: 30 | Fill #0

## 2020-05-25 NOTE — Telephone Encounter (Signed)
omeprazole (PRILOSEC) 20 MG capsule  amLODipine (NORVASC) 2.5 MG tablet  [place], [place] Phone:  [phone]  Fax:  [phone]     The patient is almost out of her amlodipine and has a physical scheduled for 07/12/2020.   Can [facility] refill her Rx for 90 days instead of 30 days? Please advise

## 2020-05-25 NOTE — Telephone Encounter (Signed)
Rx refilled sent to [facility] long.

## 2020-05-28 ENCOUNTER — Other Ambulatory Visit: Payer: Self-pay | Admitting: *Deleted

## 2020-05-28 DIAGNOSIS — I10 Essential (primary) hypertension: Secondary | ICD-10-CM

## 2020-05-28 MED ORDER — AMLODIPINE BESYLATE 2.5 MG PO TABS: 2.5000 mg | ORAL_TABLET | Freq: Every day | ORAL | 0 refills | Status: DC

## 2020-06-05 ENCOUNTER — Ambulatory Visit: Payer: 59 | Admitting: Podiatry

## 2020-06-05 ENCOUNTER — Other Ambulatory Visit: Payer: Self-pay

## 2020-06-05 ENCOUNTER — Encounter: Payer: Self-pay | Admitting: Podiatry

## 2020-06-05 ENCOUNTER — Ambulatory Visit (INDEPENDENT_AMBULATORY_CARE_PROVIDER_SITE_OTHER): Payer: 59

## 2020-06-05 DIAGNOSIS — M2141 Flat foot [pes planus] (acquired), right foot: Secondary | ICD-10-CM

## 2020-06-05 DIAGNOSIS — M722 Plantar fascial fibromatosis: Secondary | ICD-10-CM | POA: Diagnosis not present

## 2020-06-05 DIAGNOSIS — M2140 Flat foot [pes planus] (acquired), unspecified foot: Secondary | ICD-10-CM

## 2020-06-05 DIAGNOSIS — M7750 Other enthesopathy of unspecified foot: Secondary | ICD-10-CM

## 2020-06-05 MED ORDER — MELOXICAM 15 MG PO TABS
15.0000 mg | ORAL_TABLET | Freq: Every day | ORAL | 0 refills | Status: DC
Start: 2020-06-05 — End: 2020-07-12

## 2020-06-05 MED FILL — MELOXICAM 15 MG TABLET: 15 | 30 days supply | Qty: 30 | Fill #0

## 2020-06-05 NOTE — Patient Instructions (Signed)

## 2020-06-06 NOTE — Progress Notes (Signed)
Subjective:   Patient ID: [name], female   DOB: 39 y.o.   MRN: [id number]   HPI 39 year old female presents the office today for concerns of right foot and ankle pain.  This is been a chronic issue for her.  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  She also states that she ran track in high school and she had a previous ankle sprains.  No recent injury or falls.  She was under the care of another physician previously for this.  She is on work restrictions to sit in between customers at work and she is asking for an updated note.  She has tried creams, Tylenol.  No other significant treatment she reports.   Review of Systems  All other systems reviewed and are negative.  Past Medical History:  Diagnosis Date  . Hypertension   . IBS (irritable bowel syndrome) 04/13/2014  . Sickle cell trait ([place])     No past surgical history on file.   Current Outpatient Medications:  .  albuterol (PROAIR HFA) 108 (90 Base) MCG/ACT inhaler, Inhale 2 puffs into the lungs every 6 (six) hours as needed for wheezing or shortness of breath., Disp: 1 Inhaler, Rfl: 2 .  amLODipine (NORVASC) 2.5 MG tablet, Take 1 tablet (2.5 mg total) by mouth daily., Disp: 30 tablet, Rfl: 0 .  ibuprofen (ADVIL,MOTRIN) 200 MG tablet, Take 400 mg by mouth every 6 (six) hours as needed for moderate pain., Disp: , Rfl:  .  meloxicam (MOBIC) 15 MG tablet, Take 1 tablet (15 mg total) by mouth daily., Disp: 30 tablet, Rfl: 0 .  montelukast (SINGULAIR) 10 MG tablet, Take 1 tablet (10 mg total) by mouth at bedtime., Disp: 30 tablet, Rfl: 0 .  omeprazole (PRILOSEC) 20 MG capsule, Take 1 capsule (20 mg total) by mouth daily., Disp: 30 capsule, Rfl: 0 .  Prenatal Vit-Fe Fumarate-FA (PRENATAL MULTIVITAMIN) TABS tablet, Take 1 tablet by mouth daily at 12 noon., Disp: , Rfl:   Allergies  Allergen Reactions  . Latex Itching         Objective:   Physical Exam  General: AAO x3, NAD  Dermatological: Skin is warm, dry and supple bilateral. There are no open sores, no preulcerative lesions, no rash or signs of infection present.  Vascular: Dorsalis Pedis artery and Posterior Tibial artery pedal pulses are 2/4 bilateral with immedate capillary fill time. There is no pain with calf compression, swelling, warmth, erythema.   Neruologic: Grossly intact via light touch bilateral.   Musculoskeletal: Flatfoot is present.  Ankle, subtalar range of motion intact.  Flexor, extensor tendons intact.  There is mild diffuse tenderness to the foot but no specific area of pinpoint tenderness.  She does get discomfort in the course of the plantar fascia as well as the lateral aspect on the course the peroneal tendon.  There is no significant discomfort of the flexor, posterior tibial tendons.  Muscular strength 5/5 in all groups tested bilateral.  Gait: Unassisted, Nonantalgic.       Assessment:   Tendinitis, capsulitis right foot due to flatfoot deformity     Plan:  -Treatment options discussed including all alternatives, risks, and complications -Etiology of symptoms were discussed -X-rays were obtained and reviewed with the patient.  Flatfoot is present.  There is no evidence of acute fracture identified today. -Prescribed mobic. Discussed side effects of the medication and directed to stop  if any are to occur and call the office.  -Discussed general stretching exercises to help strengthen plantar fascia as well as Achilles tendon. -Discussed shoe modifications and orthotics.  We will check orthotic coverage for her and have her follow-up for this.  I think long-term this is a good solution for her daughter with her discomfort. -Updated note was provided to sit in between customers at work.   Trula Slade DPM

## 2020-06-07 ENCOUNTER — Other Ambulatory Visit: Payer: Self-pay | Admitting: Podiatry

## 2020-06-07 DIAGNOSIS — M2141 Flat foot [pes planus] (acquired), right foot: Secondary | ICD-10-CM

## 2020-06-07 DIAGNOSIS — M7750 Other enthesopathy of unspecified foot: Secondary | ICD-10-CM

## 2020-06-11 ENCOUNTER — Encounter: Payer: 59 | Admitting: Obstetrics & Gynecology

## 2020-06-21 ENCOUNTER — Other Ambulatory Visit: Payer: Self-pay

## 2020-06-21 ENCOUNTER — Other Ambulatory Visit: Payer: 59 | Admitting: Orthotics

## 2020-06-21 DIAGNOSIS — M2142 Flat foot [pes planus] (acquired), left foot: Secondary | ICD-10-CM | POA: Diagnosis not present

## 2020-06-21 DIAGNOSIS — M7750 Other enthesopathy of unspecified foot: Secondary | ICD-10-CM | POA: Diagnosis not present

## 2020-06-21 DIAGNOSIS — M2141 Flat foot [pes planus] (acquired), right foot: Secondary | ICD-10-CM | POA: Diagnosis not present

## 2020-06-30 MED FILL — AMLODIPINE 2.5 MG TABLET: 2.5 | 30 days supply | Qty: 30 | Fill #0

## 2020-07-05 ENCOUNTER — Encounter: Payer: 59 | Admitting: Family Medicine

## 2020-07-12 ENCOUNTER — Other Ambulatory Visit: Payer: Self-pay | Admitting: Family Medicine

## 2020-07-12 ENCOUNTER — Telehealth: Payer: Self-pay | Admitting: Podiatry

## 2020-07-12 ENCOUNTER — Ambulatory Visit: Payer: 59 | Admitting: Orthotics

## 2020-07-12 ENCOUNTER — Other Ambulatory Visit: Payer: Self-pay

## 2020-07-12 ENCOUNTER — Ambulatory Visit (INDEPENDENT_AMBULATORY_CARE_PROVIDER_SITE_OTHER): Payer: 59 | Admitting: Family Medicine

## 2020-07-12 ENCOUNTER — Encounter: Payer: Self-pay | Admitting: Family Medicine

## 2020-07-12 VITALS — BP 120/68 | HR 83 | Temp 98.3°F | Ht 59.5 in | Wt 157.2 lb

## 2020-07-12 DIAGNOSIS — H9313 Tinnitus, bilateral: Secondary | ICD-10-CM | POA: Diagnosis not present

## 2020-07-12 DIAGNOSIS — R42 Dizziness and giddiness: Secondary | ICD-10-CM | POA: Diagnosis not present

## 2020-07-12 DIAGNOSIS — R002 Palpitations: Secondary | ICD-10-CM

## 2020-07-12 DIAGNOSIS — Z0001 Encounter for general adult medical examination with abnormal findings: Secondary | ICD-10-CM

## 2020-07-12 DIAGNOSIS — Z13220 Encounter for screening for lipoid disorders: Secondary | ICD-10-CM

## 2020-07-12 DIAGNOSIS — J309 Allergic rhinitis, unspecified: Secondary | ICD-10-CM | POA: Diagnosis not present

## 2020-07-12 DIAGNOSIS — R079 Chest pain, unspecified: Secondary | ICD-10-CM | POA: Diagnosis not present

## 2020-07-12 DIAGNOSIS — I10 Essential (primary) hypertension: Secondary | ICD-10-CM | POA: Diagnosis not present

## 2020-07-12 DIAGNOSIS — K219 Gastro-esophageal reflux disease without esophagitis: Secondary | ICD-10-CM

## 2020-07-12 DIAGNOSIS — M7750 Other enthesopathy of unspecified foot: Secondary | ICD-10-CM

## 2020-07-12 DIAGNOSIS — M2140 Flat foot [pes planus] (acquired), unspecified foot: Secondary | ICD-10-CM

## 2020-07-12 DIAGNOSIS — M722 Plantar fascial fibromatosis: Secondary | ICD-10-CM

## 2020-07-12 DIAGNOSIS — Z0000 Encounter for general adult medical examination without abnormal findings: Secondary | ICD-10-CM

## 2020-07-12 MED ORDER — MONTELUKAST SODIUM 10 MG PO TABS: 10.0000 mg | ORAL_TABLET | Freq: Every day | ORAL | 1 refills | Status: DC

## 2020-07-12 MED ORDER — OMEPRAZOLE 20 MG PO CPDR: 20.0000 mg | DELAYED_RELEASE_CAPSULE | Freq: Every day | ORAL | 1 refills | Status: DC

## 2020-07-12 MED ORDER — MELOXICAM 15 MG PO TABS: 15.0000 mg | ORAL_TABLET | Freq: Every day | ORAL | 0 refills | Status: AC

## 2020-07-12 MED ORDER — ALBUTEROL SULFATE HFA 108 (90 BASE) MCG/ACT IN AERS: 2.0000 | INHALATION_SPRAY | Freq: Four times a day (QID) | RESPIRATORY_TRACT | 2 refills | Status: DC | PRN

## 2020-07-12 MED ORDER — AMLODIPINE BESYLATE 2.5 MG PO TABS: 2.5000 mg | ORAL_TABLET | Freq: Every day | ORAL | 1 refills | Status: DC

## 2020-07-12 MED FILL — OMEPRAZOLE 20 MG CAP: 20 | 90 days supply | Qty: 90 | Fill #0

## 2020-07-12 MED FILL — MELOXICAM 15 MG TABLET: 15 | 30 days supply | Qty: 30 | Fill #0

## 2020-07-12 MED FILL — MONTELUKAST SOD 10 MG TAB: 10 | 90 days supply | Qty: 90 | Fill #0

## 2020-07-12 MED FILL — ALBUTEROL SULFATE HFA 108 (: 108 (90 BAS | 25 days supply | Qty: 18 | Fill #0

## 2020-07-12 NOTE — Telephone Encounter (Signed)
Pt would like her Meloxicam (MOBIC) 15 MG refilled. Please advise.

## 2020-07-12 NOTE — Progress Notes (Signed)
Patient came in today to pick up custom made foot orthotics.  The goals were accomplished and the patient reported no dissatisfaction with said orthotics.  Patient was advised of breakin period and how to report any issues. 

## 2020-07-12 NOTE — Progress Notes (Signed)
Caris Cerveny Dail DOB: 25-Sep-1981 Encounter date: 07/12/2020  This is a 39 y.o. female who presents for complete physical   History of present illness/Additional concerns: Some stress at work.   Vertigo started acting up again. Has thought about getting FMLA. Always left side that is affecting her.   Also seeing Dr. Jacqualyn Posey for right foot issues. Has been accommodated to have chair between customers. Was getting more swelling in foot. Goes today to follow up; supposed to be getting inserts and hoping that this will help. Hard to be on feet all day. Both foot and vertigo have caused her to miss work. Some days she can barely walk. Has sprained right ankle twice, has some arthritis in that ankle. Off balance with vertigo and has to hold self to walk when bad. Can't lay on left side due to increase in vertigo. She has not missed work due to these issues; afraid to get penalized for missing work days. Has perfect attendance. Vertigo is random when it occurs; has been about twice a year. She has never passed out, but gets very light headed and has to pause what she is doing. Sx are worse with movement (stocking shelves, etc, very physical work). When she gets vertigo it can last for 2 weeks at a time. Can flare during episodes as well - sometimes moving head in certain direction will trigger. 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 Ears can ring with vertigo. +nausea. Room spinning around her. Also gets headaches.   Has been having some little chest pains here and there - just increased stress. Up in upper center chest. Has to sit and breathe in and out. Left arm had been hurting too. Noted last week while standing in line for rx. Does feel like heart races when this happens. Sometimes will note with activity - like when stocking at work. Has had bronchitis for awhile, but breathing has been pretty good overall. Does well  if she is not around cigarette smoke.   Last inhaler out of date. Sometimes does get short of breath with activity. No wheezing. Still using singulair.   Still using prilosec for acid reflux; this does work.   Past Medical History:  Diagnosis Date  . Hypertension   . IBS (irritable bowel syndrome) 04/13/2014  . Sickle cell trait ([place])    History reviewed. No pertinent surgical history. Allergies  Allergen Reactions  . Latex Itching   Current Meds  Medication Sig  . albuterol (PROAIR HFA) 108 (90 Base) MCG/ACT inhaler Inhale 2 puffs into the lungs every 6 (six) hours as needed for wheezing or shortness of breath.  Marland Kitchen amLODipine (NORVASC) 2.5 MG tablet Take 1 tablet (2.5 mg total) by mouth daily.  Marland Kitchen omeprazole (PRILOSEC) 20 MG capsule Take 1 capsule (20 mg total) by mouth daily.  . Prenatal Vit-Fe Fumarate-FA (PRENATAL MULTIVITAMIN) TABS tablet Take 1 tablet by mouth daily at 12 noon.  . [DISCONTINUED] albuterol (PROAIR HFA) 108 (90 Base) MCG/ACT inhaler Inhale 2 puffs into the lungs every 6 (six) hours as needed for wheezing or shortness of breath.  . [DISCONTINUED] amLODipine (NORVASC) 2.5 MG tablet Take 1 tablet (2.5 mg total) by mouth daily.  . [DISCONTINUED] ibuprofen (ADVIL,MOTRIN) 200 MG tablet Take 400 mg by mouth every 6 (six) hours as needed for moderate pain.  . [DISCONTINUED] meloxicam (MOBIC) 15 MG tablet Take 1 tablet (15 mg total) by mouth daily.  . [DISCONTINUED] omeprazole (PRILOSEC)  20 MG capsule Take 1 capsule (20 mg total) by mouth daily.   Social History   Tobacco Use  . Smoking status: Never Smoker  . Smokeless tobacco: Never Used  Substance Use Topics  . Alcohol use: No   Family History  Problem Relation Age of Onset  . Hypertension Mother   . Diabetes Maternal Aunt   . Hypertension Maternal Aunt      Review of Systems  Constitutional: Negative for activity change, appetite change, chills, fatigue, fever and unexpected weight change.  HENT: Positive  for tinnitus. Negative for congestion, ear pain, hearing loss, sinus pressure, sinus pain, sore throat and trouble swallowing.   Eyes: Negative for pain and visual disturbance.  Respiratory: Negative for cough, chest tightness, shortness of breath and wheezing.   Cardiovascular: Positive for chest pain and palpitations (more skipped beats). Negative for leg swelling.  Gastrointestinal: Negative for abdominal pain, blood in stool, constipation, diarrhea, nausea and vomiting.  Genitourinary: Negative for difficulty urinating and menstrual problem.  Musculoskeletal: Negative for arthralgias and back pain.  Skin: Negative for rash.  Neurological: Positive for dizziness. Negative for weakness, numbness and headaches.  Hematological: Negative for adenopathy. Does not bruise/bleed easily.  Psychiatric/Behavioral: Negative for sleep disturbance and suicidal ideas. The patient is nervous/anxious (increased stress at work).     CBC:  Lab Results  Component Value Date   WBC 8.4 07/12/2020   HGB 13.3 07/12/2020   HCT 40.7 07/12/2020   MCH 27.0 07/12/2020   MCHC 32.7 07/12/2020   RDW 14.7 07/12/2020   PLT 359 07/12/2020   MPV 11.6 07/12/2020   CMP: Lab Results  Component Value Date   NA 138 07/12/2020   K 5.0 07/12/2020   CL 104 07/12/2020   CO2 30 07/12/2020   ANIONGAP 13 11/23/2014   GLUCOSE 100 (H) 07/12/2020   BUN 10 07/12/2020   CREATININE 0.95 07/12/2020   GFRAA >90 11/23/2014   CALCIUM 9.7 07/12/2020   PROT 7.8 07/12/2020   BILITOT 0.4 07/12/2020   ALKPHOS 64 06/27/2019   ALT 10 07/12/2020   AST 15 07/12/2020   LIPID: Lab Results  Component Value Date   CHOL 162 07/12/2020   TRIG 72 07/12/2020   HDL 57 07/12/2020   LDLCALC 89 07/12/2020    Objective:  BP 120/68 (BP Location: Left Arm, Patient Position: Sitting, Cuff Size: Normal)   Pulse 83   Temp 98.3 F (36.8 C) (Oral)   Ht 4' 11.5" (1.511 m)   Wt 157 lb 3.2 oz (71.3 kg)   LMP 07/08/2020 (Exact Date)   BMI  31.22 kg/m   Weight: 157 lb 3.2 oz (71.3 kg)   BP Readings from Last 3 Encounters:  07/12/20 120/68  02/20/20 126/74  07/25/19 140/90   Wt Readings from Last 3 Encounters:  07/12/20 157 lb 3.2 oz (71.3 kg)  06/27/19 153 lb 12.8 oz (69.8 kg)  06/02/19 154 lb (69.9 kg)    Physical Exam Constitutional:      General: She is not in acute distress.    Appearance: She is well-developed.  HENT:     Head: Normocephalic and atraumatic.     Right Ear: External ear normal.     Left Ear: External ear normal.     Mouth/Throat:     Pharynx: No oropharyngeal exudate.  Eyes:     Conjunctiva/sclera: Conjunctivae normal.     Pupils: Pupils are equal, round, and reactive to light.  Neck:     Thyroid: No thyromegaly.  Cardiovascular:  Rate and Rhythm: Normal rate and regular rhythm.     Heart sounds: Normal heart sounds. No murmur heard.  No friction rub. No gallop.      Comments: Split S2  Pulmonary:     Effort: Pulmonary effort is normal.     Breath sounds: Normal breath sounds.  Abdominal:     General: Bowel sounds are normal. There is no distension.     Palpations: Abdomen is soft. There is no mass.     Tenderness: There is no abdominal tenderness. There is no guarding.     Hernia: No hernia is present.  Musculoskeletal:        General: No tenderness or deformity. Normal range of motion.     Cervical back: Normal range of motion and neck supple.     Right lower leg: No edema.     Left lower leg: No edema.  Lymphadenopathy:     Cervical: No cervical adenopathy.  Skin:    General: Skin is warm and dry.     Findings: No rash.  Neurological:     Mental Status: She is alert and oriented to person, place, and time.     Deep Tendon Reflexes: Reflexes normal.     Reflex Scores:      Tricep reflexes are 2+ on the right side and 2+ on the left side.      Bicep reflexes are 2+ on the right side and 2+ on the left side.      Brachioradialis reflexes are 2+ on the right side and 2+ on  the left side.      Patellar reflexes are 2+ on the right side and 2+ on the left side. Psychiatric:        Speech: Speech normal.        Behavior: Behavior normal.        Thought Content: Thought content normal.     Assessment/Plan: Health Maintenance Due  Topic Date Due  . COVID-19 Vaccine (1) Never done  . INFLUENZA VACCINE  07/11/2020   Health Maintenance reviewed - up to date.  1. Preventative health care Keep working on regular exercise and healthy eating.   2. Chest pain, unspecified type EKG sinus bradycardia, but no acute abnormalities we will proceed with lab work and consider further evaluation pending these results. - EKG 12-Lead  3. Vertigo Referral to ENT.  Wondering about Mnire's disease given tinnitus and ongoing episodes of vertigo.  Has not had specialty evaluation in the past. - TSH; Future - Iron, TIBC and Ferritin Panel; Future - Ambulatory referral to ENT - Iron, TIBC and Ferritin Panel - TSH  4. Tinnitus of both ears See above. - Ambulatory referral to ENT  5. Allergic rhinitis, unspecified seasonality, unspecified trigger Allergies generally well controlled. - montelukast (SINGULAIR) 10 MG tablet; Take 1 tablet (10 mg total) by mouth at bedtime.  Dispense: 90 tablet; Refill: 1  6. Gastroesophageal reflux disease Acid reflux is well controlled with as needed Prilosec use. - omeprazole (PRILOSEC) 20 MG capsule; Take 1 capsule (20 mg total) by mouth daily.  Dispense: 90 capsule; Refill: 1  7. Essential hypertension Blood pressure stable.  Continue current medication. - amLODipine (NORVASC) 2.5 MG tablet; Take 1 tablet (2.5 mg total) by mouth daily.  Dispense: 90 tablet; Refill: 1 - CBC with Differential/Platelet; Future - Comprehensive metabolic panel; Future - Comprehensive metabolic panel - CBC with Differential/Platelet  8. Palpitations We will start with lab work and consider further evaluation pending these results.  9. Lipid  screening - Lipid panel; Future - Lipid panel  Return for pending labs.  Micheline Rough, MD

## 2020-07-12 NOTE — Addendum Note (Signed)
Addended by: Harriett Sine D on: 07/12/2020 11:31 AM   Modules accepted: Orders

## 2020-07-12 NOTE — Telephone Encounter (Signed)
Dr. Berton Lan refill states pt needs to be seen prior to future refills.

## 2020-07-13 LAB — LIPID PANEL
Cholesterol: 162 mg/dL (ref <200)
HDL: 57 mg/dL (ref >=50)
LDL Cholesterol (Calc): 89 mg/dL (calc)
Non-HDL Cholesterol (Calc): 105 mg/dL (calc) (ref <130)
Total CHOL/HDL Ratio: 2.8 (calc) (ref <5.0)
Triglycerides: 72 mg/dL (ref <150)

## 2020-07-13 LAB — COMPREHENSIVE METABOLIC PANEL
AG Ratio: 1.2 (calc) (ref 1.0–2.5)
ALT: 10 U/L (ref 6–29)
AST: 15 U/L (ref 10–30)
Albumin: 4.3 g/dL (ref 3.6–5.1)
Alkaline phosphatase (APISO): 82 U/L (ref 31–125)
BUN: 10 mg/dL (ref 7–25)
CO2: 30 mmol/L (ref 20–32)
Calcium: 9.7 mg/dL (ref 8.6–10.2)
Chloride: 104 mmol/L (ref 98–110)
Creat: 0.95 mg/dL (ref 0.50–1.10)
Globulin: 3.5 g/dL (calc) (ref 1.9–3.7)
Glucose, Bld: 100 mg/dL — ABNORMAL HIGH (ref 65–99)
Potassium: 5 mmol/L (ref 3.5–5.3)
Sodium: 138 mmol/L (ref 135–146)
Total Bilirubin: 0.4 mg/dL (ref 0.2–1.2)
Total Protein: 7.8 g/dL (ref 6.1–8.1)

## 2020-07-13 LAB — CBC WITH DIFFERENTIAL/PLATELET
Absolute Monocytes: 622 cells/uL (ref 200–950)
Basophils Absolute: 42 cells/uL (ref 0–200)
Basophils Relative: 0.5 %
Eosinophils Absolute: 193 cells/uL (ref 15–500)
Eosinophils Relative: 2.3 %
HCT: 40.7 % (ref 35.0–45.0)
Hemoglobin: 13.3 g/dL (ref 11.7–15.5)
Lymphs Abs: 2234 cells/uL (ref 850–3900)
MCH: 27 pg (ref 27.0–33.0)
MCHC: 32.7 g/dL (ref 32.0–36.0)
MCV: 82.7 fL (ref 80.0–100.0)
MPV: 11.6 fL (ref 7.5–12.5)
Monocytes Relative: 7.4 %
Neutro Abs: 5309 cells/uL (ref 1500–7800)
Neutrophils Relative %: 63.2 %
Platelets: 359 10*3/uL (ref 140–400)
RBC: 4.92 10*6/uL (ref 3.80–5.10)
RDW: 14.7 % (ref 11.0–15.0)
Total Lymphocyte: 26.6 %
WBC: 8.4 10*3/uL (ref 3.8–10.8)

## 2020-07-13 LAB — IRON,TIBC AND FERRITIN PANEL
%SAT: 22 % (calc) (ref 16–45)
Ferritin: 30 ng/mL (ref 16–154)
Iron: 86 ug/dL (ref 40–190)
TIBC: 390 mcg/dL (calc) (ref 250–450)

## 2020-07-13 LAB — TSH: TSH: 1.23 mIU/L

## 2020-07-19 ENCOUNTER — Telehealth: Payer: Self-pay | Admitting: Family Medicine

## 2020-07-19 NOTE — Telephone Encounter (Signed)
Pt would like a call back after 4 this week. She doesn't work on fridays. She wants to talk about her lab results.  [phone]  Please advise

## 2020-07-20 NOTE — Telephone Encounter (Signed)
Spoke with the pt and informed her of the results.  I also advised the pt the referral for ENT was placed on 8/2 and gave her the number to contact Dr Pollie Friar office to see if they have any cancellations and she agreed.

## 2020-07-26 MED FILL — AMLODIPINE BESYLATE 2.5 MG: 2.5 | 90 days supply | Qty: 90 | Fill #0

## 2020-08-09 ENCOUNTER — Ambulatory Visit: Payer: 59 | Admitting: Podiatry

## 2020-08-09 ENCOUNTER — Other Ambulatory Visit: Payer: Self-pay

## 2020-08-09 ENCOUNTER — Encounter: Payer: Self-pay | Admitting: Podiatry

## 2020-08-09 DIAGNOSIS — M7750 Other enthesopathy of unspecified foot: Secondary | ICD-10-CM | POA: Diagnosis not present

## 2020-08-09 DIAGNOSIS — M2140 Flat foot [pes planus] (acquired), unspecified foot: Secondary | ICD-10-CM

## 2020-08-09 DIAGNOSIS — M722 Plantar fascial fibromatosis: Secondary | ICD-10-CM | POA: Diagnosis not present

## 2020-08-09 NOTE — Patient Instructions (Signed)

## 2020-08-10 NOTE — Progress Notes (Signed)
Subjective: 39 year old female presents the office today for follow-up evaluation of right foot ankle pain.  She is overall she is doing better.  She states that she is still getting used to the orthotics.  She still meloxicam has been helpful she states intermittently. She has no new concerns today.  No recent injury or falls or changes otherwise. Denies any systemic complaints such as fevers, chills, nausea, vomiting. No acute changes since last appointment, and no other complaints at this time.   Objective: AAO x3, NAD DP/PT pulses palpable bilaterally, CRT less than 3 seconds There is minimal tenderness palpation the on the insertion of plantar fascia.  There is no pain with lateral compression of calcaneus.  Flexor, extensor tendons appear to be intact.  Minimal discomfort on the course the peroneal tendon but again overall appears to be improved.  MMT 5/5. Flatfoot present  No pain with calf compression, swelling, warmth, erythema  Assessment: 39 year old female with resolving foot pain, plantar fasciitis/tendinitis  Plan: -All treatment options discussed with the patient including all alternatives, risks, complications.  -Discussed to continue to try to get used to the orthotics but if she needs modifications follow-up with Liliane Channel for this.  She can use meloxicam as needed.  Continue stretching, ice exercises daily as well as supportive shoes. -Patient encouraged to call the office with any questions, concerns, change in symptoms.   Trula Slade DPM

## 2020-08-23 ENCOUNTER — Encounter: Payer: Self-pay | Admitting: Obstetrics & Gynecology

## 2020-08-23 ENCOUNTER — Other Ambulatory Visit: Payer: Self-pay

## 2020-08-23 ENCOUNTER — Ambulatory Visit (INDEPENDENT_AMBULATORY_CARE_PROVIDER_SITE_OTHER): Payer: 59 | Admitting: Obstetrics & Gynecology

## 2020-08-23 VITALS — BP 130/80 | Ht 59.0 in | Wt 157.0 lb

## 2020-08-23 DIAGNOSIS — Z6831 Body mass index (BMI) 31.0-31.9, adult: Secondary | ICD-10-CM

## 2020-08-23 DIAGNOSIS — Z3009 Encounter for other general counseling and advice on contraception: Secondary | ICD-10-CM

## 2020-08-23 DIAGNOSIS — E6609 Other obesity due to excess calories: Secondary | ICD-10-CM | POA: Diagnosis not present

## 2020-08-23 DIAGNOSIS — Z01419 Encounter for gynecological examination (general) (routine) without abnormal findings: Secondary | ICD-10-CM | POA: Diagnosis not present

## 2020-08-23 DIAGNOSIS — N870 Mild cervical dysplasia: Secondary | ICD-10-CM | POA: Diagnosis not present

## 2020-08-23 DIAGNOSIS — R87610 Atypical squamous cells of undetermined significance on cytologic smear of cervix (ASC-US): Secondary | ICD-10-CM | POA: Diagnosis not present

## 2020-08-23 NOTE — Progress Notes (Signed)
[place] Sep 06, 1981 [id number]   History:    39 y.o. G3P1A2L0  Stable boyfriend  RP:  Established patient presenting for annual gyn exam   HPI:  Menses normal and regular every month.  No BTB.  No pelvic pain.  Attempting conception.  Known small asymptomatic uterine fibroids.  H/O Cervical incompetence with 2nd trimester loss. Cerclage done then with next pregnancy, but had a miscarriage.  ASCUS/HPV HR pos 05/2018. HPV 16-18-45 negative. Colpo done 02/2018 CIN 1.  No pain with IC.  Breasts normal. BMI 31.71. Needs to exercise more, Rt foot is swollen.  Will see Fam MD next month with Health labs.   Past medical history,surgical history, family history and social history were all reviewed and documented in the EPIC chart.  Gynecologic History Patient's last menstrual period was 08/09/2020.  Obstetric History OB History  Gravida Para Term Preterm AB Living  3 1 0 1 2 0  SAB TAB Ectopic Multiple Live Births  2            # Outcome Date GA Lbr Len/2nd Weight Sex Delivery Anes PTL Lv  3 Preterm 2010 [redacted]w[redacted]d   M Vag-Spont        Birth Comments: cerclage, placenta previa  2 SAB 2003 [redacted]w[redacted]d   F      1 SAB 2001 [redacted]w[redacted]d   M         ROS: A ROS was performed and pertinent positives and negatives are included in the history.  GENERAL: No fevers or chills. HEENT: No change in vision, no earache, sore throat or sinus congestion. NECK: No pain or stiffness. CARDIOVASCULAR: No chest pain or pressure. No palpitations. PULMONARY: No shortness of breath, cough or wheeze. GASTROINTESTINAL: No abdominal pain, nausea, vomiting or diarrhea, melena or bright red blood per rectum. GENITOURINARY: No urinary frequency, urgency, hesitancy or dysuria. MUSCULOSKELETAL: No joint or muscle pain, no back pain, no recent trauma. DERMATOLOGIC: No rash, no itching, no lesions. ENDOCRINE: No polyuria, polydipsia, no heat or cold intolerance. No recent change in weight. HEMATOLOGICAL: No anemia or easy  bruising or bleeding. NEUROLOGIC: No headache, seizures, numbness, tingling or weakness. PSYCHIATRIC: No depression, no loss of interest in normal activity or change in sleep pattern.     Exam:   BP 130/80   Ht 4\' 11"  (1.499 m)   Wt 157 lb (71.2 kg)   LMP 08/09/2020 Comment: no birth control   BMI 31.71 kg/m   Body mass index is 31.71 kg/m.  General appearance : Well developed well nourished female. No acute distress HEENT: Eyes: no retinal hemorrhage or exudates,  Neck supple, trachea midline, no carotid bruits, no thyroidmegaly Lungs: Clear to auscultation, no rhonchi or wheezes, or rib retractions  Heart: Regular rate and rhythm, no murmurs or gallops Breast:Examined in sitting and supine position were symmetrical in appearance, no palpable masses or tenderness,  no skin retraction, no nipple inversion, no nipple discharge, no skin discoloration, no axillary or supraclavicular lymphadenopathy Abdomen: no palpable masses or tenderness, no rebound or guarding Extremities: no edema or skin discoloration or tenderness  Pelvic: Vulva: Normal             Vagina: No gross lesions or discharge  Cervix: No gross lesions or discharge.  Pap reflex done.  Uterus  AV, normal size, shape and consistency, non-tender and mobile  Adnexa  Without masses or tenderness  Anus: Normal    Assessment/Plan:  39 y.o. female for annual exam   1. Encounter  for routine gynecological examination with Papanicolaou smear of cervix Normal gynecologic exam.  Pap reflex done.  Breast exam normal.  Health labs with family physician.  2. Dysplasia of cervix, low grade (CIN 1) Pap reflex done.  3. Encounter for other general counseling or advice on contraception Declines contraception, would like to conceive.  4. Class 1 obesity due to excess calories without serious comorbidity with body mass index (BMI) of 31.0 to 31.9 in adult Recommend a lower calorie/carb diet.  Aerobic activities 5 times a week and  light weightlifting every 2 days.  Princess Bruins MD, 2:56 PM 08/23/2020

## 2020-08-26 LAB — PAP IG W/ RFLX HPV ASCU

## 2020-08-26 LAB — HUMAN PAPILLOMAVIRUS, HIGH RISK: HPV DNA High Risk: DETECTED — AB

## 2020-08-27 ENCOUNTER — Other Ambulatory Visit: Payer: Self-pay | Admitting: *Deleted

## 2020-08-27 MED ORDER — FLUCONAZOLE 150 MG PO TABS
150.0000 mg | ORAL_TABLET | Freq: Every day | ORAL | 0 refills | Status: DC
Start: 2020-08-27 — End: 2020-12-20

## 2020-08-27 MED FILL — FLUCONAZOLE 150 MG TABS: 150 | 3 days supply | Qty: 3 | Fill #0

## 2020-09-01 ENCOUNTER — Other Ambulatory Visit: Payer: 59 | Admitting: Orthotics

## 2020-09-06 ENCOUNTER — Ambulatory Visit: Payer: 59 | Admitting: Orthotics

## 2020-09-06 ENCOUNTER — Other Ambulatory Visit: Payer: Self-pay

## 2020-09-06 DIAGNOSIS — M722 Plantar fascial fibromatosis: Secondary | ICD-10-CM

## 2020-09-06 DIAGNOSIS — M2140 Flat foot [pes planus] (acquired), unspecified foot: Secondary | ICD-10-CM

## 2020-09-06 DIAGNOSIS — M7750 Other enthesopathy of unspecified foot: Secondary | ICD-10-CM

## 2020-09-06 NOTE — Progress Notes (Signed)
Took sock liner out of shoes and trimmed a little on the f/o; she was pleased with the fit.

## 2020-09-15 ENCOUNTER — Ambulatory Visit (INDEPENDENT_AMBULATORY_CARE_PROVIDER_SITE_OTHER): Payer: 59 | Admitting: Otolaryngology

## 2020-09-15 ENCOUNTER — Other Ambulatory Visit (HOSPITAL_COMMUNITY): Payer: Self-pay | Admitting: Otolaryngology

## 2020-09-15 ENCOUNTER — Other Ambulatory Visit: Payer: Self-pay

## 2020-09-15 ENCOUNTER — Encounter (INDEPENDENT_AMBULATORY_CARE_PROVIDER_SITE_OTHER): Payer: Self-pay | Admitting: Otolaryngology

## 2020-09-15 VITALS — Temp 97.3°F

## 2020-09-15 DIAGNOSIS — H9313 Tinnitus, bilateral: Secondary | ICD-10-CM

## 2020-09-15 DIAGNOSIS — R42 Dizziness and giddiness: Secondary | ICD-10-CM

## 2020-09-15 DIAGNOSIS — J310 Chronic rhinitis: Secondary | ICD-10-CM | POA: Diagnosis not present

## 2020-09-15 MED FILL — TRIAMCINOLONE ACETONIDE 55: 55 | 30 days supply | Qty: 17 | Fill #0

## 2020-09-15 NOTE — Progress Notes (Signed)
HPI: [name] is a 39 y.o. female who presents is referred by PCP for evaluation of dizziness and tinnitus.  She also complains of chronic allergies for which she takes over-the-counter medication.  She also states that she has tonsil problems.  She complains of ringing from time to time especially on the right side.  She states that she gets dizziness when she first gets out of bed in the morning occasionally she has dizziness when she rolls over in bed but this happens infrequently.. She takes Claritin and Singulair for allergies.  Past Medical History:  Diagnosis Date  . Hypertension   . IBS (irritable bowel syndrome) 04/13/2014  . Sickle cell trait ([place])    No past surgical history on file. Social History   Socioeconomic History  . Marital status: Single    Spouse name: Not on file  . Number of children: Not on file  . Years of education: Not on file  . Highest education level: Not on file  Occupational History  . Not on file  Tobacco Use  . Smoking status: Never Smoker  . Smokeless tobacco: Never Used  Vaping Use  . Vaping Use: Never used  Substance and Sexual Activity  . Alcohol use: No  . Drug use: No  . Sexual activity: Yes    Partners: Male    Birth control/protection: Pill    Comment: 1st intercourse- 17, partners- 40, current partner-  7 yrs  Other Topics Concern  . Not on file  Social History Narrative   Work or School: works as Scientist, water quality at [employer] Situation: lives alone      Spiritual Beliefs:Christian      Lifestyle: no regular CV exercise;  Diet is not great            Social Determinants of Radio broadcast assistant Strain:   . Difficulty of Paying Living Expenses: Not on file  Food Insecurity:   . Worried About Charity fundraiser in the Last Year: Not on file  . Ran Out of Food in the Last Year: Not on file  Transportation Needs:   . Lack of Transportation (Medical): Not on file  . Lack of Transportation (Non-Medical):  Not on file  Physical Activity:   . Days of Exercise per Week: Not on file  . Minutes of Exercise per Session: Not on file  Stress:   . Feeling of Stress : Not on file  Social Connections:   . Frequency of Communication with Friends and Family: Not on file  . Frequency of Social Gatherings with Friends and Family: Not on file  . Attends Religious Services: Not on file  . Active Member of Clubs or Organizations: Not on file  . Attends Archivist Meetings: Not on file  . Marital Status: Not on file   Family History  Problem Relation Age of Onset  . Hypertension Mother   . Diabetes Maternal Aunt   . Hypertension Maternal Aunt    Allergies  Allergen Reactions  . Latex Itching   Prior to Admission medications   Medication Sig Start Date End Date Taking? Authorizing Provider  albuterol (PROAIR HFA) 108 (90 Base) MCG/ACT inhaler Inhale 2 puffs into the lungs every 6 (six) hours as needed for wheezing or shortness of breath. 07/12/20  Yes Koberlein, Junell C, MD  amLODipine (NORVASC) 2.5 MG tablet Take 1 tablet (2.5 mg total) by mouth daily. 07/12/20  Yes Caren Macadam, MD  fluconazole (DIFLUCAN) 150 MG tablet Take 1 tablet (150 mg total) by mouth daily. 08/27/20  Yes Princess Bruins, MD  meloxicam (MOBIC) 15 MG tablet Take 1 tablet (15 mg total) by mouth daily. 07/12/20 07/12/21 Yes Trula Slade, DPM  omeprazole (PRILOSEC) 20 MG capsule Take 1 capsule (20 mg total) by mouth daily. 07/12/20  Yes Koberlein, Steele Berg, MD  Prenatal Vit-Fe Fumarate-FA (PRENATAL MULTIVITAMIN) TABS tablet Take 1 tablet by mouth daily at 12 noon.   Yes [provider]  montelukast (SINGULAIR) 10 MG tablet Take 1 tablet (10 mg total) by mouth at bedtime. 07/12/20 08/11/20  Caren Macadam, MD     Positive ROS: Otherwise negative  All other systems have been reviewed and were otherwise negative with the exception of those mentioned in the HPI and as above.  Physical  Exam: Constitutional: Alert, well-appearing, no acute distress Ears: External ears without lesions or tenderness. Ear canals are clear bilaterally.  Both TMs are clear with good mobility on pneumatic otoscopy.  On Dix-Hallpike testing she had no clinical evidence of BPPV, there was no vertigo or nystagmus noted. Nasal: External nose without lesions. Septum midline with mild rhinitis with moderate mucosal swelling and large inferior turbinates..  After decongesting the nose nasal passages were otherwise clear and the posterior nasal cavity was clear.. Clear nasal passages Oral: Lips and gums without lesions. Tongue and palate mucosa without lesions. Posterior oropharynx clear. Neck: No palpable adenopathy or masses Respiratory: Breathing comfortably  Skin: No facial/neck lesions or rash noted.  Audiogram demonstrated essentially normal hearing in both ears with a very mild high-frequency right ear SNHL.  She had type A tympanograms bilaterally.  SRT's were 5 dB bilaterally.  Procedures  Assessment: Allergic rhinitis Dizziness questionable etiology. Apparent history of BPPV but did not detect any clinical evidence of BPPV in the office today. Tinnitus  Plan: Recommended use of Nasacort 2 sprays each nostril at night in addition to her antihistamine and Singulair for treatment of her allergies. Gave her information on BPPV and the Epley maneuver and reviewed this with her to treat positional vertigo. She also complains of dizziness when she first gets out of bed which is not consistent with positional vertigo and that when she stands up abruptly she gets dizzy which again is not positional vertigo. If her dizziness does not improve or is not well controlled she will call us back to schedule VNG testing if needed. Reviewed with her concerning tinnitus would recommend using masking noise to help with the tinnitus.  Reviewed with her that her hearing test was essentially normal except for a mild  upper frequency hearing loss.   Radene Journey, MD   CC:

## 2020-09-16 ENCOUNTER — Encounter (INDEPENDENT_AMBULATORY_CARE_PROVIDER_SITE_OTHER): Payer: Self-pay

## 2020-10-19 MED FILL — AMLODIPINE BESYLATE 2.5 MG: 2.5 | 90 days supply | Qty: 90 | Fill #1

## 2020-11-18 ENCOUNTER — Ambulatory Visit: Payer: 59 | Admitting: Adult Health

## 2020-11-19 ENCOUNTER — Other Ambulatory Visit (HOSPITAL_COMMUNITY): Payer: Self-pay | Admitting: Family Medicine

## 2020-11-19 ENCOUNTER — Ambulatory Visit: Payer: 59 | Admitting: Family Medicine

## 2020-11-19 ENCOUNTER — Encounter: Payer: Self-pay | Admitting: Family Medicine

## 2020-11-19 ENCOUNTER — Other Ambulatory Visit: Payer: Self-pay

## 2020-11-19 VITALS — BP 130/84 | HR 78 | Temp 98.9°F | Wt 156.6 lb

## 2020-11-19 DIAGNOSIS — R600 Localized edema: Secondary | ICD-10-CM | POA: Diagnosis not present

## 2020-11-19 DIAGNOSIS — J302 Other seasonal allergic rhinitis: Secondary | ICD-10-CM | POA: Diagnosis not present

## 2020-11-19 DIAGNOSIS — H65111 Acute and subacute allergic otitis media (mucoid) (sanguinous) (serous), right ear: Secondary | ICD-10-CM | POA: Diagnosis not present

## 2020-11-19 MED ORDER — AMOXICILLIN 500 MG PO TABS: 500.0000 mg | ORAL_TABLET | Freq: Two times a day (BID) | ORAL | 0 refills | Status: AC

## 2020-11-19 MED FILL — AMOXICILLIN 500 MG CAPSULE: 500 | 7 days supply | Qty: 14 | Fill #0

## 2020-11-19 NOTE — Patient Instructions (Signed)
Otitis Media, Adult  Otitis media occurs when there is inflammation and fluid in the middle ear. Your middle ear is a part of the ear that contains bones for hearing as well as air that helps send sounds to your brain. What are the causes? This condition is caused by a blockage in the eustachian tube. This tube drains fluid from the ear to the back of the nose (nasopharynx). A blockage in this tube can be caused by an object or by swelling (edema) in the tube. Problems that can cause a blockage include:  A cold or other upper respiratory infection.  Allergies.  An irritant, such as tobacco smoke.  Enlarged adenoids. The adenoids are areas of soft tissue located high in the back of the throat, behind the nose and the roof of the mouth.  A mass in the nasopharynx.  Damage to the ear caused by pressure changes (barotrauma). What are the signs or symptoms? Symptoms of this condition include:  Ear pain.  A fever.  Decreased hearing.  A headache.  Tiredness (lethargy).  Fluid leaking from the ear.  Ringing in the ear. How is this diagnosed? This condition is diagnosed with a physical exam. During the exam your health care provider will use an instrument called an otoscope to look into your ear and check for redness, swelling, and fluid. He or she will also ask about your symptoms. Your health care provider may also order tests, such as:  A test to check the movement of the eardrum (pneumatic otoscopy). This test is done by squeezing a small amount of air into the ear.  A test that changes air pressure in the middle ear to check how well the eardrum moves and whether the eustachian tube is working (tympanogram). How is this treated? This condition usually goes away on its own within 3-5 days. But if the condition is caused by a bacteria infection and does not go away own its own, or keeps coming back, your health care provider may:  Prescribe antibiotic medicines to treat the  infection.  Prescribe or recommend medicines to control pain. Follow these instructions at home:  Take over-the-counter and prescription medicines only as told by your health care provider.  If you were prescribed an antibiotic medicine, take it as told by your health care provider. Do not stop taking the antibiotic even if you start to feel better.  Keep all follow-up visits as told by your health care provider. This is important. Contact a health care provider if:  You have bleeding from your nose.  There is a lump on your neck.  You are not getting better in 5 days.  You feel worse instead of better. Get help right away if:  You have severe pain that is not controlled with medicine.  You have swelling, redness, or pain around your ear.  You have stiffness in your neck.  A part of your face is paralyzed.  The bone behind your ear (mastoid) is tender when you touch it.  You develop a severe headache. Summary  Otitis media is redness, soreness, and swelling of the middle ear.  This condition usually goes away on its own within 3-5 days.  If the problem does not go away in 3-5 days, your health care provider may prescribe or recommend medicines to treat your symptoms.  If you were prescribed an antibiotic medicine, take it as told by your health care provider. This information is not intended to replace advice given to you by   your health care provider. Make sure you discuss any questions you have with your health care provider. Document Revised: 11/09/2017 Document Reviewed: 11/17/2016 Elsevier Patient Education  2020 [employer]. Allergic Rhinitis, Adult Allergic rhinitis is an allergic reaction that affects the mucous membrane inside the nose. It causes sneezing, a runny or stuffy nose, and the feeling of mucus going down the back of the throat (postnasal drip). Allergic rhinitis can be mild to severe. There are two types of allergic rhinitis:  Seasonal. This type  is also called hay fever. It happens only during certain seasons.  Perennial. This type can happen at any time of the year. What are the causes? This condition happens when the body's defense system (immune system) responds to certain harmless substances called allergens as though they were germs.  Seasonal allergic rhinitis is triggered by pollen, which can come from grasses, trees, and weeds. Perennial allergic rhinitis may be caused by:  House dust mites.  Pet dander.  Mold spores. What are the signs or symptoms? Symptoms of this condition include:  Sneezing.  Runny or stuffy nose (nasal congestion).  Postnasal drip.  Itchy nose.  Tearing of the eyes.  Trouble sleeping.  Daytime sleepiness. How is this diagnosed? This condition may be diagnosed based on:  Your medical history.  A physical exam.  Tests to check for related conditions, such as: ? Asthma. ? Pink eye. ? Ear infection. ? Upper respiratory infection.  Tests to find out which allergens trigger your symptoms. These may include skin or blood tests. How is this treated? There is no cure for this condition, but treatment can help control symptoms. Treatment may include:  Taking medicines that block allergy symptoms, such as antihistamines. Medicine may be given as a shot, nasal spray, or pill.  Avoiding the allergen.  Desensitization. This treatment involves getting ongoing shots until your body becomes less sensitive to the allergen. This treatment may be done if other treatments do not help.  If taking medicine and avoiding the allergen does not work, new, stronger medicines may be prescribed. Follow these instructions at home:  Find out what you are allergic to. Common allergens include smoke, dust, and pollen.  Avoid the things you are allergic to. These are some things you can do to help avoid allergens: ? Replace carpet with wood, tile, or vinyl flooring. Carpet can trap dander and dust. ? Do  not smoke. Do not allow smoking in your home. ? Change your heating and air conditioning filter at least once a month. ? During allergy season:  Keep windows closed as much as possible.  Plan outdoor activities when pollen counts are lowest. This is usually during the evening hours.  When coming indoors, change clothing and shower before sitting on furniture or bedding.  Take over-the-counter and prescription medicines only as told by your health care provider.  Keep all follow-up visits as told by your health care provider. This is important. Contact a health care provider if:  You have a fever.  You develop a persistent cough.  You make whistling sounds when you breathe (you wheeze).  Your symptoms interfere with your normal daily activities. Get help right away if:  You have shortness of breath. Summary  This condition can be managed by taking medicines as directed and avoiding allergens.  Contact your health care provider if you develop a persistent cough or fever.  During allergy season, keep windows closed as much as possible. This information is not intended to replace advice given  to you by your health care provider. Make sure you discuss any questions you have with your health care provider. Document Revised: 11/09/2017 Document Reviewed: 01/04/2017 Elsevier Patient Education  2020 [place].  Peripheral Edema  Peripheral edema is swelling that is caused by a buildup of fluid. Peripheral edema most often affects the lower legs, ankles, and feet. It can also develop in the arms, hands, and face. The area of the body that has peripheral edema will look swollen. It may also feel heavy or warm. Your clothes may start to feel tight. Pressing on the area may make a temporary dent in your skin. You may not be able to move your swollen arm or leg as much as usual. There are many causes of peripheral edema. It can happen because of a complication of other conditions such as  congestive heart failure, kidney disease, or a problem with your blood circulation. It also can be a side effect of certain medicines or because of an infection. It often happens to women during pregnancy. Sometimes, the cause is not known. Follow these instructions at home: Managing pain, stiffness, and swelling   Raise (elevate) your legs while you are sitting or lying down.  Move around often to prevent stiffness and to lessen swelling.  Do not sit or stand for long periods of time.  Wear support stockings as told by your health care provider. Medicines  Take over-the-counter and prescription medicines only as told by your health care provider.  Your health care provider may prescribe medicine to help your body get rid of excess water (diuretic). General instructions  Pay attention to any changes in your symptoms.  Follow instructions from your health care provider about limiting salt (sodium) in your diet. Sometimes, eating less salt may reduce swelling.  Moisturize skin daily to help prevent skin from cracking and draining.  Keep all follow-up visits as told by your health care provider. This is important. Contact a health care provider if you have:  A fever.  Edema that starts suddenly or is getting worse, especially if you are pregnant or have a medical condition.  Swelling in only one leg.  Increased swelling, redness, or pain in one or both of your legs.  Drainage or sores at the area where you have edema. Get help right away if you:  Develop shortness of breath, especially when you are lying down.  Have pain in your chest or abdomen.  Feel weak.  Feel faint. Summary  Peripheral edema is swelling that is caused by a buildup of fluid. Peripheral edema most often affects the lower legs, ankles, and feet.  Move around often to prevent stiffness and to lessen swelling. Do not sit or stand for long periods of time.  Pay attention to any changes in your  symptoms.  Contact a health care provider if you have edema that starts suddenly or is getting worse, especially if you are pregnant or have a medical condition.  Get help right away if you develop shortness of breath, especially when lying down. This information is not intended to replace advice given to you by your health care provider. Make sure you discuss any questions you have with your health care provider. Document Revised: 08/21/2018 Document Reviewed: 08/21/2018 Elsevier Patient Education  [place].

## 2020-11-19 NOTE — Progress Notes (Signed)
Subjective:    Patient ID: [name], female    DOB: [birth year], 39 y.o.   MRN: [id number]  No chief complaint on file.   HPI Patient was seen today for acute concern of R ear tingling/discomfort causing vertigo x 1 day.  Endorses h/o seasonal allergies.  Taking OTC allergy medicine and Flonase.  Endorses right ear feeling full.  Patient also notes bilateral LE edema making lower leg feel full.  Edema resolves in the morning and return by the end of the next day.  Denies SOB, calf pain, erythema, h/o DVT, OCP use, tobacco use, or recent trips.  Pt works at [employer] in [employer], endorses standing on concrete floor.  Wears custom insoles and shoes 2/2 h/o flat feet and right ankle sprain x2.  Patient started wearing compression hose today.  Patient does not cook with salt at home.  Working on Design[employer], fashion/clothing intake.   Past Medical History:  Diagnosis Date  . Hypertension   . IBS (irritable bowel syndrome) 04/13/2014  . Sickle cell trait (HCC)     Allergies  Allergen Reactions  . Latex Itching    ROS General: Denies fever, chills, night sweats, changes in weight, changes in appetite HEENT: Denies headaches, ear pain, changes in vision, rhinorrhea, sore throat  + right ear fullness, vertigo CV: Denies CP, palpitations, SOB, orthopnea  + LE edema Pulm: Denies SOB, cough, wheezing GI: Denies abdominal pain, nausea, vomiting, diarrhea, constipation GU: Denies dysuria, hematuria, frequency, vaginal discharge Msk: Denies muscle cramps, joint pains Neuro: Denies weakness, numbness, tingling Skin: Denies rashes, bruising Psych: Denies depression, anxiety, hallucinations      Objective:    Blood pressure 130/84, pulse 78, temperature 98.9 F (37.2 C), temperature source Oral, weight 156 lb 9.6 oz (71 kg), SpO2 99 %.  Gen. Pleasant, well-nourished, in no distress, normal affect   HEENT: [place]/AT, face symmetric, conjunctiva clear, no scleral icterus, PERRLA, EOMI, nares  patent without drainage, pharynx without erythema or exudate.  External ears and canals normal bilaterally.  Right TM full with erythema.  Left TM normal. Lungs: no accessory muscle use, CTAB, no wheezes or rales Cardiovascular: RRR, no m/r/g, 1+ pitting edema bilaterally to shins Musculoskeletal: Negative Homans' sign.  No TTP of bilateral calves. No deformities, no cyanosis or clubbing, normal tone Neuro:  A&Ox3, CN II-XII intact, normal gait Skin:  Warm, no lesions/ rash   Wt Readings from Last 3 Encounters:  11/19/20 156 lb 9.6 oz (71 kg)  08/23/20 157 lb (71.2 kg)  07/12/20 157 lb 3.2 oz (71.3 kg)    Lab Results  Component Value Date   WBC 8.4 07/12/2020   HGB 13.3 07/12/2020   HCT 40.7 07/12/2020   PLT 359 07/12/2020   GLUCOSE 100 (H) 07/12/2020   CHOL 162 07/12/2020   TRIG 72 07/12/2020   HDL 57 07/12/2020   LDLCALC 89 07/12/2020   ALT 10 07/12/2020   AST 15 07/12/2020   NA 138 07/12/2020   K 5.0 07/12/2020   CL 104 07/12/2020   CREATININE 0.95 07/12/2020   BUN 10 07/12/2020   CO2 30 07/12/2020   TSH 1.23 07/12/2020   HGBA1C 5.6 06/27/2019    Assessment/Plan:  Non-recurrent acute allergic otitis media of right ear -Discussed supportive care -Continue OTC allergy pill and Flonase - Plan: amoxicillin (AMOXIL) 500 MG tablet  Bilateral lower extremity edema -Likely 2/2 dependent edema -Discussed limiting sodium intake, wearing compression socks or TED hose, and elevating LEs when sitting. -Patient encouraged  to take breaks when able -Given handout  Seasonal allergies -Continue Flonase and OTC allergy med -Consider nasal saline rinse -Avoid known allergens  F/u as needed  Grier Mitts, MD

## 2020-12-09 ENCOUNTER — Ambulatory Visit: Payer: 59 | Admitting: Podiatry

## 2020-12-20 ENCOUNTER — Encounter: Payer: Self-pay | Admitting: Family Medicine

## 2020-12-20 ENCOUNTER — Ambulatory Visit: Payer: 59 | Admitting: Family Medicine

## 2020-12-20 ENCOUNTER — Other Ambulatory Visit: Payer: Self-pay | Admitting: Family Medicine

## 2020-12-20 ENCOUNTER — Other Ambulatory Visit: Payer: Self-pay

## 2020-12-20 VITALS — BP 128/82 | HR 90 | Temp 98.8°F | Wt 154.2 lb

## 2020-12-20 DIAGNOSIS — R0982 Postnasal drip: Secondary | ICD-10-CM | POA: Diagnosis not present

## 2020-12-20 DIAGNOSIS — R600 Localized edema: Secondary | ICD-10-CM | POA: Diagnosis not present

## 2020-12-20 DIAGNOSIS — M79661 Pain in right lower leg: Secondary | ICD-10-CM

## 2020-12-20 MED ORDER — LEVOCETIRIZINE DIHYDROCHLORIDE 5 MG PO TABS: 5.0000 mg | ORAL_TABLET | Freq: Every evening | ORAL | 0 refills | Status: DC

## 2020-12-20 MED ORDER — FLUTICASONE PROPIONATE 50 MCG/ACT NA SUSP: 1.0000 | Freq: Every day | NASAL | 0 refills | Status: DC

## 2020-12-20 MED FILL — FLUTICASONE PROP 50 MCG SPR: 50 | 60 days supply | Qty: 16 | Fill #0

## 2020-12-20 MED FILL — LEVOCETIRIZINE 5 MG TABLET: 5 | 30 days supply | Qty: 30 | Fill #0

## 2020-12-20 NOTE — Progress Notes (Signed)
Subjective:    Patient ID: [name], female    DOB: 1981/01/04, 40 y.o.   MRN: [id number]  No chief complaint on file.   HPI Patient was seen today for f/u.  Pt with continued RLE pain and edema.  Pain at times a throbbing sensation from knee down.  Notes mild edema in R foot.  Denies SOB, palpitations, CP.  Pt wearing compression hose.  Elevating LEs which helps some.  Has an appt with Dr. Jacqualyn Posey, DPM on Thursday.  Also notes upper chest congestion/cough.   Tried OTC cough/cold med for high blood pressure.  Was on allergy meds in the past and seen by ENT.  Claritin does not really help.  Past Medical History:  Diagnosis Date  . Hypertension   . IBS (irritable bowel syndrome) 04/13/2014  . Sickle cell trait (HCC)     Allergies  Allergen Reactions  . Latex Itching    ROS General: Denies fever, chills, night sweats, changes in weight, changes in appetite HEENT: Denies headaches, ear pain, changes in vision, rhinorrhea, sore throat  CV: Denies CP, palpitations, SOB, orthopnea Pulm: Denies SOB, wheezing +cough, chest congestion GI: Denies abdominal pain, nausea, vomiting, diarrhea, constipation GU: Denies dysuria, hematuria, frequency, vaginal discharge Msk: Denies muscle cramps, joint pains  RLE pain and edema Neuro: Denies weakness, numbness, tingling Skin: Denies rashes, bruising Psych: Denies depression, anxiety, hallucinations      Objective:    Blood pressure 128/82, pulse 90, temperature 98.8 F (37.1 C), temperature source Oral, weight 154 lb 3.2 oz (69.9 kg), SpO2 98 %.   Gen. Pleasant, well-nourished, in no distress, normal affect   HEENT: [place]/AT, face symmetric, conjunctiva clear, no scleral icterus, PERRLA, EOMI, nares patent without drainage, pharynx without erythema or exudate. Normal external ears and canals.  TMs full b/l.  Cervical lymphadenopathy Lungs: no accessory muscle use, CTAB, no wheezes or rales Cardiovascular: RRR, no m/r/g, no peripheral  edema Musculoskeletal: R calf TTP and tightness in R calf with dorsiflexion of R foot.  R calf 37 cm, L calf 35 cm.  No deformities, no cyanosis or clubbing, normal tone. Neuro:  A&Ox3, CN II-XII intact, normal gait Skin:  Warm, no lesions/ rash   Wt Readings from Last 3 Encounters:  12/20/20 154 lb 3.2 oz (69.9 kg)  11/19/20 156 lb 9.6 oz (71 kg)  08/23/20 157 lb (71.2 kg)    Lab Results  Component Value Date   WBC 8.4 07/12/2020   HGB 13.3 07/12/2020   HCT 40.7 07/12/2020   PLT 359 07/12/2020   GLUCOSE 100 (H) 07/12/2020   CHOL 162 07/12/2020   TRIG 72 07/12/2020   HDL 57 07/12/2020   LDLCALC 89 07/12/2020   ALT 10 07/12/2020   AST 15 07/12/2020   NA 138 07/12/2020   K 5.0 07/12/2020   CL 104 07/12/2020   CREATININE 0.95 07/12/2020   BUN 10 07/12/2020   CO2 30 07/12/2020   TSH 1.23 07/12/2020   HGBA1C 5.6 06/27/2019    Assessment/Plan:  Right calf pain  -will obtain imaging -given strict precautions - Plan: D-dimer, Quantitative, VAS [place] LOWER EXTREMITY VENOUS (DVT), CBC with Differential/Platelet   Edema of right lower leg  -RLE>LLE -will obtain doppler to r/o DVT. - Plan: D-dimer, Quantitative, VAS [place] LOWER EXTREMITY VENOUS (DVT)  Postnasal drip -likely contributing to cough and chest congestion  -will d/c claritin - Plan: levocetirizine (XYZAL) 5 MG tablet, fluticasone (FLONASE) 50 MCG/ACT nasal spray  F/u prn with pcp  Larene Beach  Volanda Napoleon, MD

## 2020-12-20 NOTE — Patient Instructions (Signed)
Murray &amp; Nadel's Textbook of Respiratory Medicine (7th ed., pp. 508-520). Elsevier.">  Postnasal Drip Postnasal drip is the feeling of mucus going down the back of your throat. Mucus is a slimy substance that moistens and cleans your nose and throat, as well as the air pockets in face bones near your forehead and cheeks (sinuses). Small amounts of mucus pass from your nose and sinuses down the back of your throat all the time. This is normal. When you produce too much mucus or the mucus gets too thick, you can feel it. Some common causes of postnasal drip include:  Having more mucus because of: ? A cold or the flu. ? Allergies. ? Cold air. ? Certain medicines.  Having more mucus that is thicker because of: ? A sinus or nasal infection. ? Dry air. ? A food allergy. Follow these instructions at home: Relieving discomfort  Gargle with a salt-water mixture 3-4 times a day or as needed. To make a salt-water mixture, completely dissolve -1 tsp of salt in 1 cup of warm water.  If the air in your home is dry, use a humidifier to add moisture to the air.  Use a saline spray or container (neti pot) to flush out the nose (nasal irrigation). These methods can help clear away mucus and keep the nasal passages moist.   General instructions  Take over-the-counter and prescription medicines only as told by your health care provider.  Follow instructions from your health care provider about eating or drinking restrictions. You may need to avoid caffeine.  Avoid things that you know you are allergic to (allergens), like dust, mold, pollen, pets, or certain foods.  Drink enough fluid to keep your urine pale yellow.  Keep all follow-up visits as told by your health care provider. This is important. Contact a health care provider if:  You have a fever.  You have a sore throat.  You have difficulty swallowing.  You have headache.  You have sinus pain.  You have a cough that does not go  away.  The mucus from your nose becomes thick and is green or yellow in color.  You have cold or flu symptoms that last more than 10 days. Summary  Postnasal drip is the feeling of mucus going down the back of your throat.  If your health care provider approves, use nasal irrigation or a nasal spray 2?4 times a day.  Avoid things that you know you are allergic to (allergens), like dust, mold, pollen, pets, or certain foods. This information is not intended to replace advice given to you by your health care provider. Make sure you discuss any questions you have with your health care provider. Document Revised: 09/07/2020 Document Reviewed: 09/07/2020 Elsevier Patient Education  2021 Elsevier Inc.  

## 2020-12-21 LAB — CBC WITH DIFFERENTIAL/PLATELET
Basophils Absolute: 0 10*3/uL (ref 0.0–0.1)
Basophils Relative: 0.5 % (ref 0.0–3.0)
Eosinophils Absolute: 0.2 10*3/uL (ref 0.0–0.7)
Eosinophils Relative: 1.5 % (ref 0.0–5.0)
HCT: 36.6 % (ref 36.0–46.0)
Hemoglobin: 12.6 g/dL (ref 12.0–15.0)
Lymphocytes Relative: 27.5 % (ref 12.0–46.0)
Lymphs Abs: 2.8 10*3/uL (ref 0.7–4.0)
MCHC: 34.3 g/dL (ref 30.0–36.0)
MCV: 78.8 fl (ref 78.0–100.0)
Monocytes Absolute: 1 10*3/uL (ref 0.1–1.0)
Monocytes Relative: 10.1 % (ref 3.0–12.0)
Neutro Abs: 6.1 10*3/uL (ref 1.4–7.7)
Neutrophils Relative %: 60.4 % (ref 43.0–77.0)
Platelets: 366 10*3/uL (ref 150.0–400.0)
RBC: 4.64 Mil/uL (ref 3.87–5.11)
RDW: 14.3 % (ref 11.5–15.5)
WBC: 10.1 10*3/uL (ref 4.0–10.5)

## 2020-12-21 LAB — D-DIMER, QUANTITATIVE: D-Dimer, Quant: 0.43 mcg/mL FEU (ref <0.50)

## 2020-12-22 ENCOUNTER — Telehealth: Payer: Self-pay | Admitting: Family Medicine

## 2020-12-22 NOTE — Telephone Encounter (Signed)
Pt is calling in to get her lab results and is aware that when the provider gets them someone will give her a call.

## 2020-12-22 NOTE — Telephone Encounter (Signed)
Reviewed lab results with pt verbalized understanding 

## 2020-12-23 ENCOUNTER — Ambulatory Visit: Payer: 59 | Admitting: Podiatry

## 2020-12-23 ENCOUNTER — Other Ambulatory Visit: Payer: Self-pay

## 2020-12-23 ENCOUNTER — Ambulatory Visit (INDEPENDENT_AMBULATORY_CARE_PROVIDER_SITE_OTHER): Payer: 59

## 2020-12-23 DIAGNOSIS — M2140 Flat foot [pes planus] (acquired), unspecified foot: Secondary | ICD-10-CM | POA: Diagnosis not present

## 2020-12-23 DIAGNOSIS — M7750 Other enthesopathy of unspecified foot: Secondary | ICD-10-CM

## 2020-12-23 DIAGNOSIS — M79671 Pain in right foot: Secondary | ICD-10-CM | POA: Diagnosis not present

## 2020-12-23 DIAGNOSIS — M722 Plantar fascial fibromatosis: Secondary | ICD-10-CM | POA: Diagnosis not present

## 2020-12-24 ENCOUNTER — Ambulatory Visit (HOSPITAL_COMMUNITY)
Admission: RE | Admit: 2020-12-24 | Discharge: 2020-12-24 | Disposition: A | Discharge status: Home / Self Care | Payer: 59 | Source: Ambulatory Visit | Attending: Cardiovascular Disease | Admitting: Cardiovascular Disease

## 2020-12-24 DIAGNOSIS — R600 Localized edema: Secondary | ICD-10-CM

## 2020-12-24 DIAGNOSIS — M79661 Pain in right lower leg: Secondary | ICD-10-CM

## 2020-12-28 NOTE — Progress Notes (Signed)
Subjective: 40 year old female presents the office for follow evaluation of right foot and ankle discomfort. She does get the majority of  tenderness along the heel but she is also in some discomfort on the lateral aspect along the peroneal tendon she points to. No recent injury or falls. Occasional swelling to the ankle. She is scheduled for a venous duplex rule out DVT as well. No recent injury or falls or changes otherwise since I last saw her. Denies any systemic complaints such as fevers, chills, nausea, vomiting. No acute changes since last appointment, and no other complaints at this time.   Objective: AAO x3, NAD DP/PT pulses palpable bilaterally, CRT less than 3 seconds There is mild edema to the right ankle compared to the contralateral extremity there is no associated erythema or warmth. Mild tenderness palpation the course the peroneal tendon. Overall tendons appear to be intact otherwise. Minimal discomfort the plantar medial tubercle of the calcaneus with insertion of plantar fascial there is no area pinpoint tenderness. Also mild discomfort the dorsal aspect the midfoot. Flexor, extensor tendons intact. MMT 5/5. Decreased in the arch upon weightbearing. No pain with calf compression, swelling, warmth, erythema  Assessment: 40 year old female with peroneal tendinitis, rule out tear; plan fasciitis  Plan: -All treatment options discussed with the patient including all alternatives, risks, complications.  -Continue have swelling to the ankle. She has a venous duplex ordered to rule out DVT. Also would order an MRI of the right ankle to rule out partial tear of the peroneal tendon, plantar fascial. For now continue with supportive shoes, arch support. Continue stretching, icing daily. -Patient encouraged to call the office with any questions, concerns, change in symptoms.   Trula Slade DPM

## 2021-01-04 ENCOUNTER — Telehealth: Payer: Self-pay | Admitting: *Deleted

## 2021-01-04 NOTE — Telephone Encounter (Signed)
Called and spoke with Kerri Perches from [facility] today and Kerri Perches stated that they left a message on the patient's phone to call and that was on 12-31-2020. Lattie Haw

## 2021-01-04 NOTE — Telephone Encounter (Signed)
-----   Message from Trula Slade, DPM sent at 12/28/2020  6:31 PM EST ----- I have ordered an MRI if you can please follow up on this. Thanks.

## 2021-01-11 ENCOUNTER — Other Ambulatory Visit: Payer: Self-pay | Admitting: Family Medicine

## 2021-01-11 DIAGNOSIS — I10 Essential (primary) hypertension: Secondary | ICD-10-CM

## 2021-01-13 ENCOUNTER — Other Ambulatory Visit: Payer: Self-pay | Admitting: Family Medicine

## 2021-01-13 MED FILL — AMLODIPINE BESYLATE 2.5 MG: 2.5 | 90 days supply | Qty: 90 | Fill #0

## 2021-01-24 ENCOUNTER — Ambulatory Visit
Admission: RE | Admit: 2021-01-24 | Discharge: 2021-01-24 | Disposition: A | Discharge status: Home / Self Care | Payer: 59 | Source: Ambulatory Visit | Attending: Podiatry | Admitting: Podiatry

## 2021-01-24 ENCOUNTER — Other Ambulatory Visit: Payer: Self-pay

## 2021-01-24 DIAGNOSIS — M7750 Other enthesopathy of unspecified foot: Secondary | ICD-10-CM

## 2021-01-24 DIAGNOSIS — M722 Plantar fascial fibromatosis: Secondary | ICD-10-CM

## 2021-01-24 DIAGNOSIS — M19071 Primary osteoarthritis, right ankle and foot: Secondary | ICD-10-CM | POA: Diagnosis not present

## 2021-01-24 DIAGNOSIS — M65861 Other synovitis and tenosynovitis, right lower leg: Secondary | ICD-10-CM | POA: Diagnosis not present

## 2021-01-24 DIAGNOSIS — M79671 Pain in right foot: Secondary | ICD-10-CM

## 2021-01-25 ENCOUNTER — Other Ambulatory Visit: Payer: Self-pay | Admitting: Nurse Practitioner

## 2021-01-25 ENCOUNTER — Encounter: Payer: Self-pay | Admitting: Nurse Practitioner

## 2021-01-25 ENCOUNTER — Ambulatory Visit: Payer: 59 | Admitting: Nurse Practitioner

## 2021-01-25 VITALS — BP 112/70 | HR 68 | Resp 16 | Wt 151.0 lb

## 2021-01-25 DIAGNOSIS — M549 Dorsalgia, unspecified: Secondary | ICD-10-CM | POA: Diagnosis not present

## 2021-01-25 DIAGNOSIS — N760 Acute vaginitis: Secondary | ICD-10-CM

## 2021-01-25 DIAGNOSIS — B9689 Other specified bacterial agents as the cause of diseases classified elsewhere: Secondary | ICD-10-CM

## 2021-01-25 DIAGNOSIS — N939 Abnormal uterine and vaginal bleeding, unspecified: Secondary | ICD-10-CM

## 2021-01-25 DIAGNOSIS — Z113 Encounter for screening for infections with a predominantly sexual mode of transmission: Secondary | ICD-10-CM

## 2021-01-25 LAB — URINALYSIS, COMPLETE W/RFL CULTURE
Bacteria, UA: NONE SEEN /HPF
Bilirubin Urine: NEGATIVE
Glucose, UA: NEGATIVE
Hgb urine dipstick: NEGATIVE
Hyaline Cast: NONE SEEN /LPF
Ketones, ur: NEGATIVE
Leukocyte Esterase: NEGATIVE
Nitrites, Initial: NEGATIVE
Protein, ur: NEGATIVE
RBC / HPF: NONE SEEN /HPF (ref 0–2)
Specific Gravity, Urine: 1.02 (ref 1.001–1.03)
WBC, UA: NONE SEEN /HPF (ref 0–5)
pH: 7.5 (ref 5.0–8.0)

## 2021-01-25 LAB — WET PREP FOR TRICH, YEAST, CLUE

## 2021-01-25 LAB — NO CULTURE INDICATED

## 2021-01-25 MED ORDER — METRONIDAZOLE 500 MG PO TABS: 500.0000 mg | ORAL_TABLET | Freq: Two times a day (BID) | ORAL | 0 refills | Status: DC

## 2021-01-25 MED ORDER — IBUPROFEN 800 MG PO TABS: 800.0000 mg | ORAL_TABLET | Freq: Three times a day (TID) | ORAL | 1 refills | Status: DC | PRN

## 2021-01-25 MED FILL — IBUPROFEN 800 MG TAB: 800 | 10 days supply | Qty: 30 | Fill #0

## 2021-01-25 MED FILL — METRONIDAZOLE 500 MG TABS: 500 | 7 days supply | Qty: 14 | Fill #0

## 2021-01-25 NOTE — Patient Instructions (Signed)
Bacterial Vaginosis  Bacterial vaginosis is an infection that occurs when the normal balance of bacteria in the vagina changes. This change is caused by an overgrowth of certain bacteria in the vagina. Bacterial vaginosis is the most common vaginal infection among females aged 40 to 101 years. This condition increases the risk of sexually transmitted infections (STIs). Treatment can help reduce this risk. Treatment is very important for pregnant women because this condition can cause babies to be born early (prematurely) or at a low birth weight. What are the causes? This condition is caused by an increase in harmful bacteria that are normally present in small amounts in the vagina. However, the exact reason this condition develops is not known. You cannot get bacterial vaginosis from toilet seats, bedding, swimming pools, or contact with objects around you. What increases the risk? The following factors may make you more likely to develop this condition:  Having a new sexual partner or multiple sexual partners, or having unprotected sex.  Douching.  Having an intrauterine device (IUD).  Smoking.  Abusing drugs and alcohol. This may lead to riskier sexual behavior.  Taking certain antibiotic medicines.  Being pregnant. What are the signs or symptoms? Some women with this condition have no symptoms. Symptoms may include:  Pearline Cables or white vaginal discharge. The discharge can be watery or foamy.  A fish-like odor with discharge, especially after sex or during menstruation.  Itching in and around the vagina.  Burning or pain with urination. How is this diagnosed? This condition is diagnosed based on:  Your medical history.  A physical exam of the vagina.  Checking a sample of vaginal fluid for harmful bacteria or abnormal cells. How is this treated? This condition is treated with antibiotic medicines. These may be given as a pill, a vaginal cream, or a medicine that is put into the  vagina (suppository). If the condition comes back after treatment, a second round of antibiotics may be needed. Follow these instructions at home: Medicines  Take or apply over-the-counter and prescription medicines only as told by your health care provider.  Take or apply your antibiotic medicine as told by your health care provider. Do not stop using the antibiotic even if you start to feel better. General instructions  If you have a female sexual partner, tell her that you have a vaginal infection. She should follow up with her health care provider. If you have a female sexual partner, he does not need treatment.  Avoid sexual activity until you finish treatment.  Drink enough fluid to keep your urine pale yellow.  Keep the area around your vagina and rectum clean. ? Wash the area daily with warm water. ? Wipe yourself from front to back after using the toilet.  If you are breastfeeding, talk to your health care provider about continuing breastfeeding during treatment.  Keep all follow-up visits. This is important. How is this prevented? Self-care  Do not douche.  Wash the outside of your vagina with warm water only.  Wear cotton or cotton-lined underwear.  Avoid wearing tight pants and pantyhose, especially during the summer. Safe sex  Use protection when having sex. This includes: ? Using condoms. ? Using dental dams. This is a thin layer of a material made of latex or polyurethane that protects the mouth during oral sex.  Limit the number of sexual partners. To help prevent bacterial vaginosis, it is best to have sex with just one partner (monogamous relationship).  Make sure you and your sexual partner  are tested for STIs. Drugs and alcohol  Do not use any products that contain nicotine or tobacco. These products include cigarettes, chewing tobacco, and vaping devices, such as e-cigarettes. If you need help quitting, ask your health care provider.  Do not use  drugs.  Do not drink alcohol if: ? Your health care provider tells you not to do this. ? You are pregnant, may be pregnant, or are planning to become pregnant.  If you drink alcohol: ? Limit how much you have to 0-1 drink a day. ? Be aware of how much alcohol is in your drink. In the [place] one drink equals one 12 oz bottle of beer (355 mL), one 5 oz glass of wine (148 mL), or one 1 oz glass of hard liquor (44 mL). Where to find more information  Centers for Disease Control and Prevention: http://www.wolf.info/  American Sexual Health Association (ASHA): www.ashastd.org  [place].S. Department of Health and Financial controller, Office on Women's Health: [place].tn Contact a health care provider if:  Your symptoms do not improve, even after treatment.  You have more discharge or pain when urinating.  You have a fever or chills.  You have pain in your abdomen or pelvis.  You have pain during sex.  You have vaginal bleeding between menstrual periods. Summary  Bacterial vaginosis is a vaginal infection that occurs when the normal balance of bacteria in the vagina changes. It results from an overgrowth of certain bacteria.  This condition increases the risk of sexually transmitted infections (STIs). Getting treated can help reduce this risk.  Treatment is very important for pregnant women because this condition can cause babies to be born early (prematurely) or at low birth weight.  This condition is treated with antibiotic medicines. These may be given as a pill, a vaginal cream, or a medicine that is put into the vagina (suppository). This information is not intended to replace advice given to you by your health care provider. Make sure you discuss any questions you have with your health care provider. Document Revised: 05/27/2020 Document Reviewed: 05/27/2020 Elsevier Patient Education  [place].

## 2021-01-25 NOTE — Progress Notes (Signed)
GYNECOLOGY  VISIT  CC:   Back pain, vaginal spotting (vs urinary)  HPI: 40 y.o. [id number] Single Black or African American female here for back pain & bleeding when wiping. (denies dysuria)   When she wiped after urinating she saw a small clot on 01/22/2021. Then continued to have smearing on tissue x 3 days. Period was 01/11/21-01/14/2021  Back is still sore, a little better now than it was Saturday Noticed a pain in lower left pelvis on Sunday 01/23/2021. Now the pain is gone.   Wants to be checked for infection. Sexually active, one partner and is monogamous but wants to be sure.   GYNECOLOGIC HISTORY: Patient's last menstrual period was 01/11/2021 (exact date). Contraception: none Menopausal hormone therapy: none  Patient Active Problem List   Diagnosis Date Noted  . Lactose intolerance 06/27/2019  . GERD (gastroesophageal reflux disease) 06/27/2019  . Environmental allergies 11/15/2018  . Bronchitis 11/15/2018  . Wheezing 11/15/2018  . Cough 11/15/2018  . Chronic pain of right ankle 08/06/2017  . Localized osteoarthritis of right ankle 08/06/2017  . UTI (urinary tract infection) 11/25/2014  . Syncope 11/25/2014  . Leiomyoma of uterus 04/13/2014  . Amenorrhea due to Depo Provera 04/13/2014    Past Medical History:  Diagnosis Date  . Hypertension   . IBS (irritable bowel syndrome) 04/13/2014  . Sickle cell trait ([place])     History reviewed. No pertinent surgical history.  MEDS:   Current Outpatient Medications on File Prior to Visit  Medication Sig Dispense Refill  . albuterol (PROAIR HFA) 108 (90 Base) MCG/ACT inhaler Inhale 2 puffs into the lungs every 6 (six) hours as needed for wheezing or shortness of breath. 18 g 2  . amLODipine (NORVASC) 2.5 MG tablet TAKE 1 TABLET BY MOUTH DAILY. PATIENT NEEDS AN APPOINTMENT 90 tablet 0  . fluticasone (FLONASE) 50 MCG/ACT nasal spray Place 1 spray into both nostrils daily. 16 g 0  . levocetirizine (XYZAL) 5 MG tablet Take 1 tablet  (5 mg total) by mouth every evening. 30 tablet 0  . meloxicam (MOBIC) 15 MG tablet Take 1 tablet (15 mg total) by mouth daily. 30 tablet 0  . montelukast (SINGULAIR) 10 MG tablet Take 1 tablet (10 mg total) by mouth at bedtime. 90 tablet 1  . omeprazole (PRILOSEC) 20 MG capsule Take 1 capsule (20 mg total) by mouth daily. 90 capsule 1  . Prenatal Vit-Fe Fumarate-FA (PRENATAL MULTIVITAMIN) TABS tablet Take 1 tablet by mouth daily at 12 noon.    . triamcinolone (NASACORT) 55 MCG/ACT AERO nasal inhaler SMARTSIG:2 Spray(s) Both Nares Every Evening     No current facility-administered medications on file prior to visit.    ALLERGIES: Latex  Family History  Problem Relation Age of Onset  . Hypertension Mother   . Diabetes Maternal Aunt   . Hypertension Maternal Aunt      Review of Systems  Constitutional: Negative.   HENT: Negative.   Eyes: Negative.   Respiratory: Negative.   Cardiovascular: Negative.   Gastrointestinal: Negative.   Endocrine: Negative.   Genitourinary:       Back pain & bleeding when wiping  Musculoskeletal: Negative.   Skin: Negative.   Allergic/Immunologic: Negative.   Neurological: Negative.   Hematological: Negative.   Psychiatric/Behavioral: Negative.     PHYSICAL EXAMINATION:    BP 112/70   Pulse 68   Resp 16   Wt 151 lb (68.5 kg)   LMP 01/11/2021 (Exact Date)   BMI 30.50 kg/m  General appearance: alert, cooperative, no acute distress  Lymph:  no inguinal LAD noted  Pelvic: External genitalia:  no lesions              Urethra:  normal appearing urethra with no masses, tenderness or lesions              Bartholins and Skenes: normal                 Vagina: normal appearing vagina, creamy, yellowish discharge, no visible blood              Cervix: no cervical motion tenderness and no lesions              Bimanual Exam:  Uterus: firm and irregular 10-12 week size              Adnexa: no mass, fullness, tenderness               Chaperone,  Joy, CMA, was present for exam.  Assessment: Back pain, unspecified back location, unspecified back pain laterality, unspecified chronicity - Plan: Urinalysis,Complete w/RFL Culture, ibuprofen (ADVIL) 800 MG tablet  Screen for STD (sexually transmitted disease) - Plan: WET PREP FOR TRICH, YEAST, CLUE, C. trachomatis/N. gonorrhoeae RNA  Vaginal spotting  BV (bacterial vaginosis) - Plan: metroNIDAZOLE (FLAGYL) 500 MG tablet

## 2021-01-26 ENCOUNTER — Telehealth: Payer: Self-pay | Admitting: Podiatry

## 2021-01-26 LAB — C. TRACHOMATIS/N. GONORRHOEAE RNA
C. trachomatis RNA, TMA: NOT DETECTED
N. gonorrhoeae RNA, TMA: NOT DETECTED

## 2021-01-26 NOTE — Telephone Encounter (Signed)
Patient on the line, requesting return call

## 2021-01-26 NOTE — Telephone Encounter (Signed)
Patient called, returning call regarding MRI results, Please Advise

## 2021-01-26 NOTE — Telephone Encounter (Signed)
Patient called about her MRI results

## 2021-01-26 NOTE — Telephone Encounter (Signed)
Called patient and left VM- unable to reach her. I have also sent her a MyChart message.

## 2021-01-27 NOTE — Telephone Encounter (Signed)
Called back and not able to reach her. Left another voicemail

## 2021-02-15 ENCOUNTER — Other Ambulatory Visit: Payer: Self-pay | Admitting: *Deleted

## 2021-02-15 DIAGNOSIS — N870 Mild cervical dysplasia: Secondary | ICD-10-CM

## 2021-02-16 ENCOUNTER — Telehealth: Payer: Self-pay

## 2021-02-16 NOTE — Telephone Encounter (Signed)
Call to patient. Per DPR, OK to leave message on voicemail.   Left voicemail requesting a return call to [facility] to review benefits for scheduled Colposcopy with Princess Bruins, MD, [place], [place], [place].

## 2021-02-25 NOTE — Telephone Encounter (Signed)
Spoke with patient regarding benefits for scheduled Colposcopy. Patient acknowledges understanding of information presented. Encounter closed. 

## 2021-03-07 ENCOUNTER — Ambulatory Visit: Payer: 59 | Admitting: Obstetrics & Gynecology

## 2021-03-18 ENCOUNTER — Encounter: Payer: Self-pay | Admitting: Obstetrics & Gynecology

## 2021-03-18 ENCOUNTER — Other Ambulatory Visit: Payer: Self-pay

## 2021-03-18 ENCOUNTER — Other Ambulatory Visit (HOSPITAL_COMMUNITY)
Admission: RE | Admit: 2021-03-18 | Discharge: 2021-03-18 | Disposition: A | Discharge status: Home / Self Care | Payer: 59 | Source: Ambulatory Visit | Attending: Obstetrics & Gynecology | Admitting: Obstetrics & Gynecology

## 2021-03-18 ENCOUNTER — Ambulatory Visit (INDEPENDENT_AMBULATORY_CARE_PROVIDER_SITE_OTHER): Payer: 59 | Admitting: Obstetrics & Gynecology

## 2021-03-18 DIAGNOSIS — N879 Dysplasia of cervix uteri, unspecified: Secondary | ICD-10-CM | POA: Diagnosis not present

## 2021-03-18 DIAGNOSIS — R87810 Cervical high risk human papillomavirus (HPV) DNA test positive: Secondary | ICD-10-CM | POA: Diagnosis not present

## 2021-03-18 DIAGNOSIS — N870 Mild cervical dysplasia: Secondary | ICD-10-CM | POA: Insufficient documentation

## 2021-03-18 DIAGNOSIS — R87610 Atypical squamous cells of undetermined significance on cytologic smear of cervix (ASC-US): Secondary | ICD-10-CM

## 2021-03-18 NOTE — Progress Notes (Signed)
    [name] [birth year] [id number]        40 y.o.  [id number]   RP: ASCUS/HPV HR positive for Colposcopy  HPI: ASCUS/HPV HR pos 08/2020.  Colpo 07/2019 not reaching dysplasia.   OB History  Gravida Para Term Preterm AB Living  3 1 0 1 2 0  SAB IAB Ectopic Multiple Live Births  2            # Outcome Date GA Lbr Len/2nd Weight Sex Delivery Anes PTL Lv  3 Preterm 2010 [redacted]w[redacted]d   M Vag-Spont        Birth Comments: cerclage, placenta previa  2 SAB 2003 [redacted]w[redacted]d   F      1 SAB 2001 [redacted]w[redacted]d   M        Past medical history,surgical history, problem list, medications, allergies, family history and social history were all reviewed and documented in the EPIC chart.   Directed ROS with pertinent positives and negatives documented in the history of present illness/assessment and plan.  Exam:  Vitals:   03/18/21 0835  BP: 120/80   General appearance:  Normal  Colposcopy Procedure Note [name] 03/18/2021  Indications:  ASCUS/HPV HR pos  Procedure Details  The risks and benefits of the procedure and Verbal informed consent obtained.  Speculum placed in vagina and excellent visualization of cervix achieved, cervix swabbed x 3 with acetic acid solution.  Findings:  Cervix colposcopy: Physical Exam Genitourinary:       Vaginal colposcopy: Normal  Vulvar colposcopy: Normal  Perirectal colposcopy: Normal  The cervix was sprayed with Hurricane before performing the cervical biopsies.  Specimens: Cervical Bx at 11 O'Clock  Complications:  None, good hemostasis with Silver Nitrate. . Plan: Management per Cervical Bx results   Assessment/Plan:  40 y.o. G3P0120   1. ASCUS with positive high risk HPV cervical Counseling on ASCUS with positive high-risk HPV done.  Colposcopy findings reviewed with patient.  Management per cervical biopsy results.  Postprocedure precautions reviewed. - Colposcopy - Surgical pathology( [place]/  [facility])  Princess Bruins MD, 8:59 AM 03/18/2021

## 2021-03-20 ENCOUNTER — Encounter: Payer: Self-pay | Admitting: Obstetrics & Gynecology

## 2021-03-22 LAB — SURGICAL PATHOLOGY

## 2021-04-07 ENCOUNTER — Other Ambulatory Visit: Payer: Self-pay | Admitting: Family Medicine

## 2021-04-07 ENCOUNTER — Other Ambulatory Visit (HOSPITAL_COMMUNITY): Payer: Self-pay

## 2021-04-07 DIAGNOSIS — I10 Essential (primary) hypertension: Secondary | ICD-10-CM

## 2021-04-07 MED ORDER — AMLODIPINE BESYLATE 2.5 MG PO TABS
ORAL_TABLET | ORAL | 0 refills | Status: DC
  Filled 2021-04-07: qty 90, 90d supply, fill #0

## 2021-07-15 ENCOUNTER — Other Ambulatory Visit: Payer: Self-pay | Admitting: Family Medicine

## 2021-07-15 ENCOUNTER — Other Ambulatory Visit (HOSPITAL_COMMUNITY): Payer: Self-pay

## 2021-07-15 DIAGNOSIS — I10 Essential (primary) hypertension: Secondary | ICD-10-CM

## 2021-07-15 MED ORDER — AMLODIPINE BESYLATE 2.5 MG PO TABS
ORAL_TABLET | ORAL | 0 refills | Status: DC
  Filled 2021-07-15: qty 90, 90d supply, fill #0

## 2021-08-11 ENCOUNTER — Other Ambulatory Visit: Payer: Self-pay

## 2021-08-12 ENCOUNTER — Ambulatory Visit (INDEPENDENT_AMBULATORY_CARE_PROVIDER_SITE_OTHER): Payer: 59 | Admitting: Family Medicine

## 2021-08-12 ENCOUNTER — Encounter: Payer: Self-pay | Admitting: Family Medicine

## 2021-08-12 ENCOUNTER — Other Ambulatory Visit (HOSPITAL_COMMUNITY): Payer: Self-pay

## 2021-08-12 VITALS — BP 110/68 | HR 60 | Temp 98.1°F | Ht 59.5 in | Wt 151.3 lb

## 2021-08-12 DIAGNOSIS — I10 Essential (primary) hypertension: Secondary | ICD-10-CM

## 2021-08-12 DIAGNOSIS — K219 Gastro-esophageal reflux disease without esophagitis: Secondary | ICD-10-CM

## 2021-08-12 DIAGNOSIS — Z131 Encounter for screening for diabetes mellitus: Secondary | ICD-10-CM

## 2021-08-12 DIAGNOSIS — L301 Dyshidrosis [pompholyx]: Secondary | ICD-10-CM

## 2021-08-12 DIAGNOSIS — R600 Localized edema: Secondary | ICD-10-CM

## 2021-08-12 DIAGNOSIS — Z0000 Encounter for general adult medical examination without abnormal findings: Secondary | ICD-10-CM

## 2021-08-12 DIAGNOSIS — Z13220 Encounter for screening for lipoid disorders: Secondary | ICD-10-CM

## 2021-08-12 DIAGNOSIS — R002 Palpitations: Secondary | ICD-10-CM

## 2021-08-12 DIAGNOSIS — D219 Benign neoplasm of connective and other soft tissue, unspecified: Secondary | ICD-10-CM | POA: Diagnosis not present

## 2021-08-12 DIAGNOSIS — Z9109 Other allergy status, other than to drugs and biological substances: Secondary | ICD-10-CM | POA: Diagnosis not present

## 2021-08-12 DIAGNOSIS — Z23 Encounter for immunization: Secondary | ICD-10-CM | POA: Diagnosis not present

## 2021-08-12 LAB — LIPID PANEL
Cholesterol: 159 mg/dL (ref 0–200)
HDL: 55 mg/dL (ref >39.00)
LDL Cholesterol: 92 mg/dL (ref 0–99)
NonHDL: 103.64
Total CHOL/HDL Ratio: 3
Triglycerides: 59 mg/dL (ref 0.0–149.0)
VLDL: 11.8 mg/dL (ref 0.0–40.0)

## 2021-08-12 LAB — CBC WITH DIFFERENTIAL/PLATELET
Basophils Absolute: 0 10*3/uL (ref 0.0–0.1)
Basophils Relative: 0.4 % (ref 0.0–3.0)
Eosinophils Absolute: 0.1 10*3/uL (ref 0.0–0.7)
Eosinophils Relative: 1.3 % (ref 0.0–5.0)
HCT: 38 % (ref 36.0–46.0)
Hemoglobin: 12.9 g/dL (ref 12.0–15.0)
Lymphocytes Relative: 20.7 % (ref 12.0–46.0)
Lymphs Abs: 1.9 10*3/uL (ref 0.7–4.0)
MCHC: 33.9 g/dL (ref 30.0–36.0)
MCV: 80.8 fl (ref 78.0–100.0)
Monocytes Absolute: 0.7 10*3/uL (ref 0.1–1.0)
Monocytes Relative: 7.5 % (ref 3.0–12.0)
Neutro Abs: 6.6 10*3/uL (ref 1.4–7.7)
Neutrophils Relative %: 70.1 % (ref 43.0–77.0)
Platelets: 303 10*3/uL (ref 150.0–400.0)
RBC: 4.7 Mil/uL (ref 3.87–5.11)
RDW: 14.3 % (ref 11.5–15.5)
WBC: 9.4 10*3/uL (ref 4.0–10.5)

## 2021-08-12 LAB — BRAIN NATRIURETIC PEPTIDE: Pro B Natriuretic peptide (BNP): 35 pg/mL (ref 0.0–100.0)

## 2021-08-12 LAB — IBC + FERRITIN
Ferritin: 20.2 ng/mL (ref 10.0–291.0)
Iron: 57 ug/dL (ref 42–145)
Saturation Ratios: 14.3 % — ABNORMAL LOW (ref 20.0–50.0)
TIBC: 397.6 ug/dL (ref 250.0–450.0)
Transferrin: 284 mg/dL (ref 212.0–360.0)

## 2021-08-12 LAB — COMPREHENSIVE METABOLIC PANEL
ALT: 10 U/L (ref 0–35)
AST: 15 U/L (ref 0–37)
Albumin: 4.1 g/dL (ref 3.5–5.2)
Alkaline Phosphatase: 66 U/L (ref 39–117)
BUN: 11 mg/dL (ref 6–23)
CO2: 27 mEq/L (ref 19–32)
Calcium: 9.2 mg/dL (ref 8.4–10.5)
Chloride: 102 mEq/L (ref 96–112)
Creatinine, Ser: 0.87 mg/dL (ref 0.40–1.20)
GFR: 83.67 mL/min (ref >60.00)
Glucose, Bld: 90 mg/dL (ref 70–99)
Potassium: 4.2 mEq/L (ref 3.5–5.1)
Sodium: 137 mEq/L (ref 135–145)
Total Bilirubin: 0.4 mg/dL (ref 0.2–1.2)
Total Protein: 7.7 g/dL (ref 6.0–8.3)

## 2021-08-12 LAB — TSH: TSH: 1.04 u[IU]/mL (ref 0.35–5.50)

## 2021-08-12 MED ORDER — TRIAMCINOLONE ACETONIDE 0.1 % EX CREA
1.0000 "application " | TOPICAL_CREAM | Freq: Two times a day (BID) | CUTANEOUS | 0 refills | Status: DC
  Filled 2021-08-12: qty 30, 15d supply, fill #0

## 2021-08-12 MED ORDER — OMEPRAZOLE 20 MG PO CPDR
20.0000 mg | DELAYED_RELEASE_CAPSULE | Freq: Every day | ORAL | 1 refills | Status: DC
  Filled 2021-08-12: qty 90, 90d supply, fill #0

## 2021-08-12 MED ORDER — CLOTRIMAZOLE 1 % EX CREA
1.0000 "application " | TOPICAL_CREAM | Freq: Two times a day (BID) | CUTANEOUS | 0 refills | Status: DC
  Filled 2021-08-12: qty 28, 14d supply, fill #0

## 2021-08-12 NOTE — Progress Notes (Signed)
[name] DOB: 04-23-1981 Encounter date: 08/12/2021  This is a 40 y.o. female who presents for complete physical   History of present illness/Additional concerns: Last visit with me was 07/2020.   Started new job with [employer]. Has been difficult for her. She started job in may. A lot of new information and this has been stressful for her. Was going home with chest pains; feeling stressed.   Just feeling more intense anxiety, headaches esp at end of day. When stressed out then vertigo acts up. Also tired - thinks related to stress, but also feels that fibroids cause heavy cycles which make her more tired.   She is sleeping ok. Does usually wake up at night to go to the bathroom. Doesn't have trouble falling asleep.   Breathing has been doing ok - if going up steps then sometimes has to pause. No chest pain with going up stairs. Single flight of stairs. Still taking xyzal, nasacort, singulair.   GERD: prilosec works well for her.   HTN: has felt that it was high when under stress last week, but happy with good numbers today. Has been taking amlodipine daily. Does note some swelling right lower leg. Has seen podiatry and has arthritis in right foot. Usually good in the morning.   Follows with Dr. Dellis Filbert for gyn care.   Past Medical History:  Diagnosis Date   Hypertension    IBS (irritable bowel syndrome) 04/13/2014   Sickle cell trait ([place])    History reviewed. No pertinent surgical history. Allergies  Allergen Reactions   Latex Itching   Current Meds  Medication Sig   albuterol (PROAIR HFA) 108 (90 Base) MCG/ACT inhaler Inhale 2 puffs into the lungs every 6 (six) hours as needed for wheezing or shortness of breath.   amLODipine (NORVASC) 2.5 MG tablet TAKE 1 TABLET BY MOUTH DAILY. PATIENT NEEDS AN APPOINTMENT   fluticasone (FLONASE) 50 MCG/ACT nasal spray PLACE 1 SPRAY INTO BOTH NOSTRILS DAILY.   ibuprofen (ADVIL) 800 MG tablet TAKE 1 TABLET BY MOUTH  EVERY 8 HOURS AS NEEDED   levocetirizine (XYZAL) 5 MG tablet TAKE 1 TABLET BY MOUTH EVERY EVENING   Prenatal Vit-Fe Fumarate-FA (PRENATAL MULTIVITAMIN) TABS tablet Take 1 tablet by mouth daily at 12 noon.   triamcinolone (NASACORT) 55 MCG/ACT AERO nasal inhaler USE 2 SPRAYS INTO EACH NOSTRIL EACH EVENING.   [DISCONTINUED] omeprazole (PRILOSEC) 20 MG capsule Take 1 capsule (20 mg total) by mouth daily.   Social History   Tobacco Use   Smoking status: Never   Smokeless tobacco: Never  Substance Use Topics   Alcohol use: No   Family History  Problem Relation Age of Onset   Hypertension Mother    Diabetes Maternal Aunt    Hypertension Maternal Aunt      Review of Systems  Constitutional:  Negative for activity change, appetite change, chills, fatigue, fever and unexpected weight change.  HENT:  Negative for congestion, ear pain, hearing loss, sinus pressure, sinus pain, sore throat and trouble swallowing.   Eyes:  Negative for pain and visual disturbance.  Respiratory:  Negative for cough, chest tightness (with stress at work; hasn't had any in this last week), shortness of breath (when going up stairs) and wheezing.   Cardiovascular:  Negative for chest pain, palpitations and leg swelling (right leg; not new).  Gastrointestinal:  Negative for abdominal pain, blood in stool, constipation, diarrhea, nausea and vomiting.  Genitourinary:  Negative for difficulty urinating and menstrual problem.  Musculoskeletal:  Negative for arthralgias and back pain.  Skin:  Negative for rash.  Neurological:  Negative for dizziness, weakness, numbness and headaches.  Hematological:  Negative for adenopathy. Does not bruise/bleed easily.  Psychiatric/Behavioral:  Negative for sleep disturbance and suicidal ideas. The patient is not nervous/anxious.    CBC:  Lab Results  Component Value Date   WBC 10.1 12/20/2020   HGB 12.6 12/20/2020   HCT 36.6 12/20/2020   MCH 27.0 07/12/2020   MCHC 34.3  12/20/2020   RDW 14.3 12/20/2020   PLT 366.0 12/20/2020   MPV 11.6 07/12/2020   CMP: Lab Results  Component Value Date   NA 138 07/12/2020   K 5.0 07/12/2020   CL 104 07/12/2020   CO2 30 07/12/2020   ANIONGAP 13 11/23/2014   GLUCOSE 100 (H) 07/12/2020   BUN 10 07/12/2020   CREATININE 0.95 07/12/2020   GFRAA >90 11/23/2014   CALCIUM 9.7 07/12/2020   PROT 7.8 07/12/2020   BILITOT 0.4 07/12/2020   ALKPHOS 64 06/27/2019   ALT 10 07/12/2020   AST 15 07/12/2020   LIPID: Lab Results  Component Value Date   CHOL 162 07/12/2020   TRIG 72 07/12/2020   HDL 57 07/12/2020   LDLCALC 89 07/12/2020    Objective:  BP 110/68 (BP Location: Left Arm, Patient Position: Sitting, Cuff Size: Normal)   Pulse 60   Temp 98.1 F (36.7 C) (Oral)   Ht 4' 11.5" (1.511 m)   Wt 151 lb 4.8 oz (68.6 kg)   LMP 08/11/2021 (Exact Date)   SpO2 99%   BMI 30.05 kg/m   Weight: 151 lb 4.8 oz (68.6 kg)   BP Readings from Last 3 Encounters:  08/12/21 110/68  03/18/21 120/80  01/25/21 112/70   Wt Readings from Last 3 Encounters:  08/12/21 151 lb 4.8 oz (68.6 kg)  01/25/21 151 lb (68.5 kg)  12/20/20 154 lb 3.2 oz (69.9 kg)    Physical Exam Constitutional:      General: She is not in acute distress.    Appearance: She is well-developed.  HENT:     Head: Normocephalic and atraumatic.     Right Ear: External ear normal.     Left Ear: External ear normal.     Mouth/Throat:     Pharynx: No oropharyngeal exudate.  Eyes:     Conjunctiva/sclera: Conjunctivae normal.     Pupils: Pupils are equal, round, and reactive to light.  Neck:     Thyroid: No thyromegaly.  Cardiovascular:     Rate and Rhythm: Normal rate and regular rhythm.     Heart sounds: Normal heart sounds. No murmur heard.   No friction rub. No gallop.  Pulmonary:     Effort: Pulmonary effort is normal.     Breath sounds: Normal breath sounds.  Abdominal:     General: Bowel sounds are normal. There is no distension.      Palpations: Abdomen is soft. There is no mass.     Tenderness: There is no abdominal tenderness. There is no guarding.     Hernia: No hernia is present.  Musculoskeletal:        General: No tenderness or deformity. Normal range of motion.     Cervical back: Normal range of motion and neck supple.  Lymphadenopathy:     Cervical: No cervical adenopathy.  Skin:    General: Skin is warm and dry.     Findings: No rash.  Neurological:     Mental Status: She is alert  and oriented to person, place, and time.     Deep Tendon Reflexes: Reflexes normal.     Reflex Scores:      Tricep reflexes are 2+ on the right side and 2+ on the left side.      Bicep reflexes are 2+ on the right side and 2+ on the left side.      Brachioradialis reflexes are 2+ on the right side and 2+ on the left side.      Patellar reflexes are 2+ on the right side and 2+ on the left side. Psychiatric:        Speech: Speech normal.        Behavior: Behavior normal.        Thought Content: Thought content normal.    Assessment/Plan: Health Maintenance Due  Topic Date Due   TETANUS/TDAP  12/11/2020   INFLUENZA VACCINE  07/11/2021   Health Maintenance reviewed - flu shot today.   1. Preventative health care Keep up with healthy lifestyle; we are going to work on stress control.  2. Environmental allergies Well controlled with current medications.   3. Gastroesophageal reflux disease, unspecified whether esophagitis present Controlled with omeprazole.   4. Hypertension, unspecified type Well controlled on amlodipine.   6. Edema of right lower leg Wear compression stockings when at work.   7. Fibroid Following with Dr. Dellis Filbert.  - IBC + Ferritin; Future  8. Palpitations Stress related, but will check some bloodwork today. Let me know if these continue.  - CBC with Differential/Platelet; Future - TSH; Future  9. Lipid screening - Lipid panel; Future  10. Screening for diabetes mellitus - Comprehensive  metabolic panel; Future   Return in about 6 months (around 02/09/2022) for Chronic condition visit.  Micheline Rough, MD

## 2021-08-25 ENCOUNTER — Other Ambulatory Visit (HOSPITAL_COMMUNITY)
Admission: RE | Admit: 2021-08-25 | Discharge: 2021-08-25 | Disposition: A | Discharge status: Home / Self Care | Payer: 59 | Source: Ambulatory Visit | Attending: Obstetrics & Gynecology | Admitting: Obstetrics & Gynecology

## 2021-08-25 ENCOUNTER — Other Ambulatory Visit: Payer: Self-pay

## 2021-08-25 ENCOUNTER — Ambulatory Visit (INDEPENDENT_AMBULATORY_CARE_PROVIDER_SITE_OTHER): Payer: 59 | Admitting: Obstetrics & Gynecology

## 2021-08-25 ENCOUNTER — Encounter: Payer: Self-pay | Admitting: Obstetrics & Gynecology

## 2021-08-25 VITALS — BP 102/62 | HR 65 | Resp 16 | Ht 59.25 in | Wt 149.0 lb

## 2021-08-25 DIAGNOSIS — Z01419 Encounter for gynecological examination (general) (routine) without abnormal findings: Secondary | ICD-10-CM | POA: Diagnosis not present

## 2021-08-25 DIAGNOSIS — R87610 Atypical squamous cells of undetermined significance on cytologic smear of cervix (ASC-US): Secondary | ICD-10-CM | POA: Insufficient documentation

## 2021-08-25 DIAGNOSIS — Z3009 Encounter for other general counseling and advice on contraception: Secondary | ICD-10-CM | POA: Diagnosis not present

## 2021-08-25 DIAGNOSIS — R87810 Cervical high risk human papillomavirus (HPV) DNA test positive: Secondary | ICD-10-CM | POA: Diagnosis not present

## 2021-08-25 NOTE — Progress Notes (Signed)
[place] 21-Aug-1981 [id number]   History:    40 y.o.  G3P1A2L0  Stable boyfriend   RP:  Established patient presenting for annual gyn exam    HPI:  Menses normal and regular every month.  No BTB.  No pelvic pain.  Attempting conception.  Known small asymptomatic uterine fibroids.  H/O Cervical incompetence with 2nd trimester loss. Cerclage done then with next pregnancy, but had a miscarriage.  ASCUS/HPV HR pos, colpo 03/2021 No Dysplasia.  HPV 16-18-45 negative in 2019.  No pain with IC.  Breasts normal. BMI 29.84. Exercising more.  [facility] labs 08/2021/.   Past medical history,surgical history, family history and social history were all reviewed and documented in the EPIC chart.  Gynecologic History Patient's last menstrual period was 08/11/2021 (exact date).  Obstetric History OB History  Gravida Para Term Preterm AB Living  3 1 0 1 2 0  SAB IAB Ectopic Multiple Live Births  2            # Outcome Date GA Lbr Len/2nd Weight Sex Delivery Anes PTL Lv  3 Preterm 2010 [redacted]w[redacted]d  M Vag-Spont        Birth Comments: cerclage, placenta previa  2 SAB 2003 266w0d F      1 SAB 2001 1616w0dM         ROS: A ROS was performed and pertinent positives and negatives are included in the history.  GENERAL: No fevers or chills. HEENT: No change in vision, no earache, sore throat or sinus congestion. NECK: No pain or stiffness. CARDIOVASCULAR: No chest pain or pressure. No palpitations. PULMONARY: No shortness of breath, cough or wheeze. GASTROINTESTINAL: No abdominal pain, nausea, vomiting or diarrhea, melena or bright red blood per rectum. GENITOURINARY: No urinary frequency, urgency, hesitancy or dysuria. MUSCULOSKELETAL: No joint or muscle pain, no back pain, no recent trauma. DERMATOLOGIC: No rash, no itching, no lesions. ENDOCRINE: No polyuria, polydipsia, no heat or cold intolerance. No recent change in weight. HEMATOLOGICAL: No anemia or easy bruising or bleeding. NEUROLOGIC:  No headache, seizures, numbness, tingling or weakness. PSYCHIATRIC: No depression, no loss of interest in normal activity or change in sleep pattern.     Exam:   BP 102/62   Pulse 65   Resp 16   Ht 4' 11.25" (1.505 m)   Wt 149 lb (67.6 kg)   LMP 08/11/2021 (Exact Date)   BMI 29.84 kg/m   Body mass index is 29.84 kg/m.  General appearance : Well developed well nourished female. No acute distress HEENT: Eyes: no retinal hemorrhage or exudates,  Neck supple, trachea midline, no carotid bruits, no thyroidmegaly Lungs: Clear to auscultation, no rhonchi or wheezes, or rib retractions  Heart: Regular rate and rhythm, no murmurs or gallops Breast:Examined in sitting and supine position were symmetrical in appearance, no palpable masses or tenderness,  no skin retraction, no nipple inversion, no nipple discharge, no skin discoloration, no axillary or supraclavicular lymphadenopathy Abdomen: no palpable masses or tenderness, no rebound or guarding Extremities: no edema or skin discoloration or tenderness  Pelvic: Vulva: Normal             Vagina: No gross lesions or discharge  Cervix: No gross lesions or discharge.  Pap reflex done.  Uterus  AV, stable size, mildly nodular, non-tender and mobile  Adnexa  Without masses or tenderness  Anus: Normal   Assessment/Plan:  [age]o. female for annual exam   1. Encounter  for routine gynecological examination with Papanicolaou smear of cervix Normal gynecologic exam.  Pap reflex done.  Breast exam normal.  We will schedule her first screening mammogram at age 4 in November 2022.  Body mass index 29.84.  Recommend to continue with a slightly lower calorie/carb diet.  Improve fitness.  Health labs with family physician. - Cytology - PAP( [place])  2. ASCUS with positive high risk HPV cervical - Cytology - PAP( )  3. Encounter for other general counseling or advice on contraception  Patient declines contraception.  Would like to  conceive.  Advanced maternal age with history of cervical incompetence and miscarriage.  Declines any fertility intervention.  Princess Bruins MD, 11:04 AM 08/25/2021

## 2021-08-26 ENCOUNTER — Encounter: Payer: Self-pay | Admitting: Obstetrics & Gynecology

## 2021-08-31 LAB — CYTOLOGY - PAP
Comment: NEGATIVE
Diagnosis: UNDETERMINED — AB
High risk HPV: POSITIVE — AB

## 2021-09-19 ENCOUNTER — Other Ambulatory Visit (HOSPITAL_COMMUNITY): Payer: Self-pay

## 2021-09-19 MED ORDER — AMOXICILLIN 500 MG PO CAPS
ORAL_CAPSULE | ORAL | 0 refills | Status: DC
  Filled 2021-09-19: qty 21, 7d supply, fill #0

## 2021-10-18 ENCOUNTER — Other Ambulatory Visit: Payer: Self-pay

## 2021-10-18 ENCOUNTER — Emergency Department (HOSPITAL_COMMUNITY)
Admission: EM | Admit: 2021-10-18 | Discharge: 2021-10-18 | Disposition: A | Discharge status: Home / Self Care | Payer: 59 | Attending: Student | Admitting: Student

## 2021-10-18 ENCOUNTER — Other Ambulatory Visit (HOSPITAL_COMMUNITY): Payer: Self-pay

## 2021-10-18 ENCOUNTER — Encounter (HOSPITAL_COMMUNITY): Payer: Self-pay

## 2021-10-18 ENCOUNTER — Emergency Department (HOSPITAL_COMMUNITY): Payer: 59

## 2021-10-18 DIAGNOSIS — M542 Cervicalgia: Secondary | ICD-10-CM | POA: Insufficient documentation

## 2021-10-18 DIAGNOSIS — Y92410 Unspecified street and highway as the place of occurrence of the external cause: Secondary | ICD-10-CM | POA: Insufficient documentation

## 2021-10-18 DIAGNOSIS — M79602 Pain in left arm: Secondary | ICD-10-CM | POA: Diagnosis not present

## 2021-10-18 DIAGNOSIS — S0990XA Unspecified injury of head, initial encounter: Secondary | ICD-10-CM | POA: Diagnosis not present

## 2021-10-18 DIAGNOSIS — R519 Headache, unspecified: Secondary | ICD-10-CM | POA: Diagnosis not present

## 2021-10-18 DIAGNOSIS — I10 Essential (primary) hypertension: Secondary | ICD-10-CM | POA: Insufficient documentation

## 2021-10-18 DIAGNOSIS — M79622 Pain in left upper arm: Secondary | ICD-10-CM | POA: Diagnosis not present

## 2021-10-18 DIAGNOSIS — Z79899 Other long term (current) drug therapy: Secondary | ICD-10-CM | POA: Insufficient documentation

## 2021-10-18 MED ORDER — METHOCARBAMOL 500 MG PO TABS
500.0000 mg | ORAL_TABLET | Freq: Two times a day (BID) | ORAL | 0 refills | Status: AC
  Filled 2021-10-18: qty 20, 10d supply, fill #0

## 2021-10-18 MED ORDER — LIDOCAINE 5 % EX PTCH
1.0000 | MEDICATED_PATCH | CUTANEOUS | 0 refills | Status: DC
  Filled 2021-10-18: qty 30, 30d supply, fill #0
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
## 2021-10-24 ENCOUNTER — Telehealth: Payer: Self-pay

## 2021-10-24 ENCOUNTER — Other Ambulatory Visit (HOSPITAL_COMMUNITY): Payer: Self-pay

## 2021-10-24 ENCOUNTER — Other Ambulatory Visit: Payer: Self-pay | Admitting: Family Medicine

## 2021-10-24 DIAGNOSIS — I10 Essential (primary) hypertension: Secondary | ICD-10-CM

## 2021-10-24 MED ORDER — AMLODIPINE BESYLATE 2.5 MG PO TABS
ORAL_TABLET | ORAL | 0 refills | Status: DC
  Filled 2021-10-24: qty 90, 90d supply, fill #0

## 2021-10-24 NOTE — Telephone Encounter (Signed)
Patient called asking if she can be worked in for a Hospital follow on 11/16 after 3pm patient would like a call back and stated it is ok to leave a voicemail

## 2021-10-24 NOTE — Telephone Encounter (Signed)
Pt is aware of appt on 10-26-2021 at 330pm

## 2021-10-24 NOTE — Telephone Encounter (Signed)
Ok to put in at 3:30

## 2021-10-25 ENCOUNTER — Other Ambulatory Visit (HOSPITAL_COMMUNITY): Payer: Self-pay

## 2021-10-25 ENCOUNTER — Telehealth: Payer: Self-pay

## 2021-10-25 NOTE — Telephone Encounter (Signed)
Patient called and canceled appt for H f/u because her job had a meeting scheduled at the same time as appt and patient wants to know if [name] could work her in,  patient would like a call back.

## 2021-10-25 NOTE — Telephone Encounter (Signed)
Noted  

## 2021-10-26 ENCOUNTER — Ambulatory Visit: Payer: 59 | Admitting: Family Medicine

## 2021-10-26 NOTE — Telephone Encounter (Signed)
Patient returned call and was informed of message patient stated she will call back Friday or when her schedule allows her to schedule a office visit

## 2021-10-26 NOTE — Telephone Encounter (Signed)
Patient called again to see if she could be worked in for a hospital follow up today at 4:30-4:45. Patient states she has a department meeting and will be coming from [facility]. I let patient know Dr.koberlein has received the message but is seeing patients and will get back when she is available?

## 2021-10-26 NOTE — Telephone Encounter (Signed)
Left a message for the patient to return my call.  

## 2021-10-27 NOTE — Telephone Encounter (Signed)
Noted  

## 2021-10-28 ENCOUNTER — Telehealth: Payer: Self-pay | Admitting: Family Medicine

## 2021-10-28 NOTE — Telephone Encounter (Signed)
Pt is calling and was unable to keep her appt on 10-26-2021 due to work. . Pt is calling and requesting er follow up with dr Ethlyn Gallery . Pt is available on 11-07-2021 all day and after 3 pm on 11-30, 12-1 or 11-11-2021

## 2021-10-28 NOTE — Telephone Encounter (Signed)
Spoke with the patient and scheduled an appt on 11/28 at 8:30am due to recent opening.

## 2021-11-02 ENCOUNTER — Ambulatory Visit: Payer: 59 | Admitting: Family Medicine

## 2021-11-07 ENCOUNTER — Telehealth: Payer: Self-pay | Admitting: Family Medicine

## 2021-11-07 ENCOUNTER — Encounter: Payer: Self-pay | Admitting: Family Medicine

## 2021-11-07 ENCOUNTER — Ambulatory Visit: Payer: 59 | Admitting: Family Medicine

## 2021-11-07 VITALS — BP 108/68 | HR 67 | Temp 98.2°F | Ht 59.25 in | Wt 148.1 lb

## 2021-11-07 DIAGNOSIS — M546 Pain in thoracic spine: Secondary | ICD-10-CM | POA: Diagnosis not present

## 2021-11-07 DIAGNOSIS — F0781 Postconcussional syndrome: Secondary | ICD-10-CM

## 2021-11-07 DIAGNOSIS — M5441 Lumbago with sciatica, right side: Secondary | ICD-10-CM

## 2021-11-07 NOTE — Patient Instructions (Signed)
Brain rest: avoid loud sounds - a quiet environment is best. Avoid electronics. Rest when needed. You should not have a headache when you return to work.
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
## 2021-11-07 NOTE — Telephone Encounter (Signed)
Patient stated that her employer will be sending over FMLA forms for her [employer] and being out of work. She just wanted to let her know to protect her job.  Patient is requesting a phone call back at [phone] when Wendie Simmer or Dr.Koberlein receive paperwork.  Please advise.

## 2021-11-08 ENCOUNTER — Other Ambulatory Visit (HOSPITAL_COMMUNITY): Payer: Self-pay

## 2021-11-08 ENCOUNTER — Telehealth: Payer: Self-pay

## 2021-11-08 ENCOUNTER — Ambulatory Visit: Payer: 59 | Admitting: Physician Assistant

## 2021-11-08 ENCOUNTER — Encounter: Payer: Self-pay | Admitting: Physician Assistant

## 2021-11-08 ENCOUNTER — Encounter: Payer: Self-pay | Admitting: Family Medicine

## 2021-11-08 ENCOUNTER — Ambulatory Visit (INDEPENDENT_AMBULATORY_CARE_PROVIDER_SITE_OTHER): Payer: 59

## 2021-11-08 DIAGNOSIS — M5441 Lumbago with sciatica, right side: Secondary | ICD-10-CM | POA: Diagnosis not present

## 2021-11-08 DIAGNOSIS — M542 Cervicalgia: Secondary | ICD-10-CM | POA: Diagnosis not present

## 2021-11-08 MED ORDER — METHYLPREDNISOLONE 4 MG PO TBPK
ORAL_TABLET | ORAL | 0 refills | Status: DC
  Filled 2021-11-08: qty 21, 6d supply, fill #0

## 2021-11-08 NOTE — Telephone Encounter (Signed)
Patient called asking if paperwork from [employer] for her job had been received and if so can a copy of the documents be printed for patient to pick up.  Patient asked for a call back when ready for pick up
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
## 2021-11-09 NOTE — Telephone Encounter (Signed)
Returned; see other message.

## 2021-11-09 NOTE — Telephone Encounter (Signed)
Forms completed and returned to you

## 2021-11-09 NOTE — Telephone Encounter (Signed)
Forms were completed, faxed and sent to be scanned.  Patient informed via Mychart message.

## 2021-11-28 ENCOUNTER — Other Ambulatory Visit (HOSPITAL_COMMUNITY): Payer: Self-pay

## 2021-11-28 MED ORDER — AMOXICILLIN 500 MG PO CAPS
ORAL_CAPSULE | ORAL | 0 refills | Status: DC
  Filled 2021-11-28: qty 21, 7d supply, fill #0

## 2021-11-30 ENCOUNTER — Other Ambulatory Visit (HOSPITAL_COMMUNITY): Payer: Self-pay

## 2021-11-30 MED ORDER — IBUPROFEN 800 MG PO TABS
ORAL_TABLET | ORAL | 1 refills | Status: DC
  Filled 2021-11-30: qty 20, 5d supply, fill #0

## 2021-11-30 MED ORDER — CHLORHEXIDINE GLUCONATE 0.12 % MT SOLN
OROMUCOSAL | 0 refills | Status: DC
  Filled 2021-11-30: qty 473, 30d supply, fill #0

## 2021-12-16 ENCOUNTER — Ambulatory Visit
Admission: RE | Admit: 2021-12-16 | Discharge: 2021-12-16 | Disposition: A | Discharge status: Home / Self Care | Payer: 59 | Source: Ambulatory Visit | Attending: Physician Assistant | Admitting: Physician Assistant

## 2021-12-16 ENCOUNTER — Other Ambulatory Visit: Payer: Self-pay

## 2021-12-16 DIAGNOSIS — M5117 Intervertebral disc disorders with radiculopathy, lumbosacral region: Secondary | ICD-10-CM | POA: Diagnosis not present

## 2021-12-16 DIAGNOSIS — M5441 Lumbago with sciatica, right side: Secondary | ICD-10-CM

## 2021-12-16 DIAGNOSIS — M4802 Spinal stenosis, cervical region: Secondary | ICD-10-CM | POA: Diagnosis not present

## 2021-12-16 DIAGNOSIS — M50123 Cervical disc disorder at C6-C7 level with radiculopathy: Secondary | ICD-10-CM | POA: Diagnosis not present

## 2021-12-16 DIAGNOSIS — M4727 Other spondylosis with radiculopathy, lumbosacral region: Secondary | ICD-10-CM | POA: Diagnosis not present

## 2021-12-16 DIAGNOSIS — M542 Cervicalgia: Secondary | ICD-10-CM

## 2021-12-16 DIAGNOSIS — M4722 Other spondylosis with radiculopathy, cervical region: Secondary | ICD-10-CM | POA: Diagnosis not present

## 2021-12-20 ENCOUNTER — Encounter: Payer: Self-pay | Admitting: Orthopaedic Surgery

## 2021-12-20 ENCOUNTER — Ambulatory Visit: Payer: 59 | Admitting: Orthopaedic Surgery

## 2021-12-20 DIAGNOSIS — M542 Cervicalgia: Secondary | ICD-10-CM | POA: Diagnosis not present

## 2021-12-20 DIAGNOSIS — M5441 Lumbago with sciatica, right side: Secondary | ICD-10-CM
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
## 2022-01-03 ENCOUNTER — Other Ambulatory Visit: Payer: Self-pay

## 2022-01-03 ENCOUNTER — Ambulatory Visit: Payer: 59 | Attending: Orthopaedic Surgery | Admitting: Physical Therapy

## 2022-01-03 ENCOUNTER — Encounter: Payer: Self-pay | Admitting: Physical Therapy

## 2022-01-03 DIAGNOSIS — M542 Cervicalgia: Secondary | ICD-10-CM | POA: Diagnosis not present

## 2022-01-03 DIAGNOSIS — M6281 Muscle weakness (generalized): Secondary | ICD-10-CM | POA: Diagnosis not present

## 2022-01-03 DIAGNOSIS — M5441 Lumbago with sciatica, right side: Secondary | ICD-10-CM | POA: Insufficient documentation

## 2022-01-03 DIAGNOSIS — M545 Low back pain, unspecified: Secondary | ICD-10-CM | POA: Insufficient documentation
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
## 2022-01-09 ENCOUNTER — Other Ambulatory Visit: Payer: Self-pay | Admitting: Family Medicine

## 2022-01-09 ENCOUNTER — Other Ambulatory Visit: Payer: Self-pay

## 2022-01-09 ENCOUNTER — Other Ambulatory Visit (HOSPITAL_COMMUNITY): Payer: Self-pay

## 2022-01-09 ENCOUNTER — Ambulatory Visit: Payer: 59 | Admitting: Physical Therapy

## 2022-01-09 ENCOUNTER — Encounter: Payer: Self-pay | Admitting: Physical Therapy

## 2022-01-09 DIAGNOSIS — M5441 Lumbago with sciatica, right side: Secondary | ICD-10-CM | POA: Diagnosis not present

## 2022-01-09 DIAGNOSIS — M542 Cervicalgia: Secondary | ICD-10-CM | POA: Diagnosis not present

## 2022-01-09 DIAGNOSIS — M6281 Muscle weakness (generalized): Secondary | ICD-10-CM

## 2022-01-09 DIAGNOSIS — M545 Low back pain, unspecified: Secondary | ICD-10-CM

## 2022-01-09 DIAGNOSIS — I10 Essential (primary) hypertension: Secondary | ICD-10-CM

## 2022-01-09 MED ORDER — AMLODIPINE BESYLATE 2.5 MG PO TABS
ORAL_TABLET | ORAL | 0 refills | Status: DC
  Filled 2022-01-09: qty 90, 90d supply, fill #0

## 2022-01-09 NOTE — Therapy (Signed)
OUTPATIENT PHYSICAL THERAPY TREATMENT NOTE   Patient Name: [name] MRN: [id number] DOB:07-Jul-1981, 41 y.o., female Today's Date: 01/09/2022  PCP: Caren Macadam, MD REFERRING PROVIDER: Caren Macadam, MD   PT End of Session - 01/09/22 1616     Visit Number 2    Number of Visits 16    Date for PT Re-Evaluation 02/28/22    Authorization Type UMR    PT Start Time 1615    PT Stop Time 1655    PT Time Calculation (min) 40 min    Activity Tolerance Patient tolerated treatment well    Behavior During Therapy [facility] for tasks assessed/performed             Past Medical History:  Diagnosis Date   Hypertension    IBS (irritable bowel syndrome) 04/13/2014   Sickle cell trait ([place])    History reviewed. No pertinent surgical history. Patient Active Problem List   Diagnosis Date Noted   Lactose intolerance 06/27/2019   GERD (gastroesophageal reflux disease) 06/27/2019   Environmental allergies 11/15/2018   Bronchitis 11/15/2018   Wheezing 11/15/2018   Cough 11/15/2018   Chronic pain of right ankle 08/06/2017   Localized osteoarthritis of right ankle 08/06/2017   Syncope 11/25/2014   Leiomyoma of uterus 04/13/2014   Amenorrhea due to Depo Provera 04/13/2014    REFERRING PROVIDER: Garald Balding, MD   REFERRING DIAG: Acute bilateral low back pain with right-sided sciatica, Neck pain  THERAPY DIAG:  Low back pain without sciatica, unspecified back pain laterality, unspecified chronicity  Muscle weakness  Cervicalgia  PERTINENT HISTORY: None  PRECAUTIONS: None   SUBJECTIVE: Patient reports she is doing better, she has been working and it is going good but 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  PAIN:  Are you having pain? Yes NPRS scale: 4/10 Pain location: Back Pain orientation: Lower  PAIN TYPE: Chronic Pain description: intermittent, sore, tight Aggravating factors: standing and sitting for long periods (30 -> 45 min) Relieving  factors: gentle movement  Patient Goals: make back feel like normal, reduce pain   OBJECTIVE: (BOLDED MEASURES ASSESSED THIS VISIT) GENERAL OBSERVATION:           Increased anterior pelvic tilt   LUMBAR AROM   AROM AROM  01/03/2022  Flexion WNL, w/ concordant pain  Extension WNL, w/ concordant pain  Right lateral flexion WNL  Left lateral flexion WNL  Right rotation WNL  Left rotation WNL    LE MMT:   MMT Right 01/03/2022 Left 01/03/2022 Rt/ Lt 01/09/2022  Hip flexion (L2, L3)       Knee extension (L3)       Knee flexion       Hip abduction 3+ 4 3+ / 4  Hip extension 3* 3*   Hip external rotation       Hip internal rotation       Hip adduction       Ankle dorsiflexion (L4)       Ankle plantarflexion (S1)       Ankle inversion       Ankle eversion       Great Toe ext (L5)        (score listed is out of 5 possible points.  N = WNL, D = diminished, C = clear for gross weakness with myotome testing, * = concordant pain with testing)    PALPATION:            TTP bil lumbar paraspinals and R  QL   PATIENT SURVEYS:  FOTO 66 - > 74     TODAY'S TREATMENT:  01/09/2022: NuStep L6 x 4 min while taking subjective LTR 3 x 10 sec hold each Piriformis stretch 2 x 30 sec each Child's pose stretch 2 x 15 sec fwd, 2 x 15 sec each side  Cat cow 2 x 5 Resisted PPT using belt 2 x 5 with 5 sec hold Bridge 2 x 10 SLR 2 x 10 each Side clamshell with green 2 x 15 each Sit to stand holding 10# at chest 2 x 10    PATIENT EDUCATION:  HEP. Pt educated via explanation, demonstration. Pt confirms understanding verbally.    HOME EXERCISE PROGRAM: Access Code: [email or web address]    ASSESSMENT: CLINICAL IMPRESSION: Patient tolerated therapy well with no adverse effects. Therapy focused primarily on stretching and progressing lumbar strength and control. Patient reports increased soreness and muscular tightness with activity, no increase in pain reported with therapy this visit. She does continue  to exhibit increased lumbar lordosis so worked on abdominal control and resisted pelvic tilts. She requires consistent cueing for proper exercise technique and lumbar control to avoid excessive lordosis. Patient will benefit from continued skilled therapy to address pain and the listed deficits in order to achieve functional goals, enable safety and independence in completion of daily tasks, and return to PLOF.     GOALS: SHORT TERM GOALS:   STG Name Target Date Goal status  1 Daffney will be >75% HEP compliant to improve carryover between sessions and facilitate independent management of condition   Baseline: No HEP 01/24/2022 INITIAL    LONG TERM GOALS:    LTG Name Target Date Goal status  1 Ayannah will improve FOTO score from 66 (baseline) to 74 as a proxy for functional improvement 02/28/2022 INITIAL  2 Carol will report >/= 50% decrease in pain from evaluation    Baseline: 10/10 max pain 02/28/2022 INITIAL  3 Ranita will improve the following MMTs to >/= 4/5 to show improvement in strength:  hip ext and abduction    Baseline: see chart in note 02/28/2022 INITIAL  4 Lafaye will be able to stand for >102 min at work, not limited by pain    Baseline: 30 min 02/28/2022 INITIAL     PLAN: PT FREQUENCY: 1-2x/week   PT DURATION: 8 weeks (Ending 02/28/2022)   PLANNED INTERVENTIONS: Therapeutic exercises, Therapeutic activity, Neuro Muscular re-education, Gait training, Patient/Family education, Joint mobilization, Dry Needling, Electrical stimulation, Spinal mobilization and/or manipulation, Moist heat, Taping, Vasopneumatic device, Ionotophoresis 4mg /ml Dexamethasone, and Manual therapy   PLAN FOR NEXT SESSION: progressive lumbar and hip strengthening, TDN and manual PRN    Hilda Blades, PT, DPT, LAT, ATC 01/09/22  5:00 PM Phone: [phone] Fax: [phone]

## 2022-01-17 ENCOUNTER — Encounter: Payer: Self-pay | Admitting: Physical Therapy

## 2022-01-17 ENCOUNTER — Ambulatory Visit: Payer: 59 | Attending: Orthopaedic Surgery | Admitting: Physical Therapy

## 2022-01-17 ENCOUNTER — Other Ambulatory Visit: Payer: Self-pay

## 2022-01-17 DIAGNOSIS — M6281 Muscle weakness (generalized): Secondary | ICD-10-CM | POA: Insufficient documentation

## 2022-01-17 DIAGNOSIS — M545 Low back pain, unspecified: Secondary | ICD-10-CM | POA: Insufficient documentation

## 2022-01-17 DIAGNOSIS — M542 Cervicalgia: Secondary | ICD-10-CM | POA: Diagnosis not present

## 2022-01-17 NOTE — Patient Instructions (Signed)
Access Code: [email or web address] URL: [email or web address] Date: 01/17/2022 Prepared by: Hilda Blades  Exercises Supine Bridge - 1 x daily - 7 x weekly - 3 sets - 10 reps Supine Posterior Pelvic Tilt - 2 x daily - 7 x weekly - 2 sets - 10 reps - 5'' hold Supine March with Posterior Pelvic Tilt - 2 x daily - 7 x weekly - 2 sets - 10 reps Hooklying Clamshell with Resistance - 2 x daily - 7 x weekly - 2 sets - 10 reps Supine Lower Trunk Rotation - 1 x daily - 7 x weekly - 1 sets - 20 reps - 3 hold

## 2022-01-17 NOTE — Therapy (Signed)
OUTPATIENT PHYSICAL THERAPY TREATMENT NOTE   Patient Name: [name] MRN: [id number] DOB:[birth year], 41 y.o., female Today's Date: 01/17/2022  PCP: Caren Macadam, MD REFERRING PROVIDER: Caren Macadam, MD   PT End of Session - 01/17/22 1604     Visit Number 3    Number of Visits 16    Date for PT Re-Evaluation 02/28/22    Authorization Type UMR    PT Start Time 1608    PT Stop Time 1650    PT Time Calculation (min) 42 min    Activity Tolerance Patient tolerated treatment well    Behavior During Therapy [facility] for tasks assessed/performed              Past Medical History:  Diagnosis Date   Hypertension    IBS (irritable bowel syndrome) 04/13/2014   Sickle cell trait ([place])    History reviewed. No pertinent surgical history. Patient Active Problem List   Diagnosis Date Noted   Lactose intolerance 06/27/2019   GERD (gastroesophageal reflux disease) 06/27/2019   Environmental allergies 11/15/2018   Bronchitis 11/15/2018   Wheezing 11/15/2018   Cough 11/15/2018   Chronic pain of right ankle 08/06/2017   Localized osteoarthritis of right ankle 08/06/2017   Syncope 11/25/2014   Leiomyoma of uterus 04/13/2014   Amenorrhea due to Depo Provera 04/13/2014    REFERRING PROVIDER: Garald Balding, MD   REFERRING DIAG: Acute bilateral low back pain with right-sided sciatica, Neck pain  THERAPY DIAG:  Low back pain without sciatica, unspecified back pain laterality, unspecified chronicity  Muscle weakness  Cervicalgia  PERTINENT HISTORY: None  PRECAUTIONS: None   SUBJECTIVE: Patient reports she is hurting today, she just came from work and had a long day at work and it typically causes her more pain.  PAIN:  Are you having pain? Yes NPRS scale: 6/10 Pain location: Back Pain orientation: Lower  PAIN TYPE: Chronic Pain description: intermittent, sore, tight Aggravating factors: standing and sitting for long periods (30 -> 45  min) Relieving factors: gentle movement  Patient Goals: make back feel like normal, reduce pain   OBJECTIVE: (BOLDED MEASURES ASSESSED THIS VISIT) GENERAL OBSERVATION:            Increased anterior pelvic tilt   LUMBAR AROM   AROM AROM  01/03/2022  Flexion WNL, w/ concordant pain  Extension WNL, w/ concordant pain  Right lateral flexion WNL  Left lateral flexion WNL  Right rotation WNL  Left rotation WNL    LE MMT:   MMT Right 01/03/2022 Left 01/03/2022 Rt/ Lt 01/09/2022 Rt / Lt 01/17/2022  Hip flexion (L2, L3)        Knee extension (L3)        Knee flexion        Hip abduction 3+ 4 3+ / 4   Hip extension 3* 3*  3+ / 3+  Hip external rotation        Hip internal rotation        Hip adduction        Ankle dorsiflexion (L4)        Ankle plantarflexion (S1)        Ankle inversion        Ankle eversion        Great Toe ext (L5)         (score listed is out of 5 possible points.  N = WNL, D = diminished, C = clear for gross weakness with myotome testing, * = concordant pain  with testing)    PALPATION:            TTP bil lumbar paraspinals and R QL   PATIENT SURVEYS:  FOTO 66 - > 74     TODAY'S TREATMENT:  01/17/2022:  Therapeutic Exercise: NuStep L6 x 5 min with LE while taking subjective Supine SKTC stretch 2 x 20 sec each Piriformis stretch 2 x 20 sec each Thomas stretch with passive knee flexion 2 x 60 sec LTR 5 x 5 sec each Posterior pelvic tilt 10 x 5 sec hold PPT with alternating march x 10 PPT with clamshell using green x 10 Bridge 2 x 10 with 3 sec hold   01/09/2022: Therapeutic Exercise: NuStep L6 x 4 min while taking subjective LTR 3 x 10 sec hold each Piriformis stretch 2 x 30 sec each Child's pose stretch 2 x 15 sec fwd, 2 x 15 sec each side  Cat cow 2 x 5 Resisted PPT using belt 2 x 5 with 5 sec hold Bridge 2 x 10 SLR 2 x 10 each Side clamshell with green 2 x 15 each Sit to stand holding 10# at chest 2 x 10  PATIENT EDUCATION:  HEP update.  Pt educated via explanation, demonstration, handout. Pt confirms understanding verbally.    HOME EXERCISE PROGRAM: Access Code: [email or web address]    ASSESSMENT: CLINICAL IMPRESSION: Patient tolerated therapy well with no adverse effects. Patient arrived to therapy with report of increased low back pain so therapy focused primarily on mobility exercises and light core activation in order to help reduce pain and improve her tolerance for work related tasks. Patient did demonstrate improved ability to perform pelvic tilt and properly engage core this visit, but continues tor require consistent cues and demonstrates increase in lumbar lordosis. Updated HEP this visit to continue core stabilization. Patient will benefit from continued skilled therapy to address pain and the listed deficits in order to achieve functional goals, enable safety and independence in completion of daily tasks, and return to PLOF.     GOALS: SHORT TERM GOALS:   STG Name Target Date Goal status  1 [name] will be >75% HEP compliant to improve carryover between sessions and facilitate independent management of condition   Baseline: progressing with HEP 01/24/2022 ONGOING    LONG TERM GOALS:    LTG Name Target Date Goal status  1 [name] will improve FOTO score from 66 (baseline) to 74 as a proxy for functional improvement 02/28/2022 INITIAL  2 [name] will report >/= 50% decrease in pain from evaluation    Baseline: 10/10 max pain 02/28/2022 INITIAL  3 [name] will improve the following MMTs to >/= 4/5 to show improvement in strength:  hip ext and abduction    Baseline: see chart in note 02/28/2022 INITIAL  4 [name] will be able to stand for >102 min at work, not limited by pain    Baseline: 30 min 02/28/2022 INITIAL     PLAN: PT FREQUENCY: 1-2x/week   PT DURATION: 8 weeks (Ending 02/28/2022)   PLANNED INTERVENTIONS: Therapeutic exercises, Therapeutic activity, Neuro Muscular re-education, Gait training,  Patient/Family education, Joint mobilization, Dry Needling, Electrical stimulation, Spinal mobilization and/or manipulation, Moist heat, Taping, Vasopneumatic device, Ionotophoresis 4mg /ml Dexamethasone, and Manual therapy   PLAN FOR NEXT SESSION: progressive lumbar and hip strengthening, TDN and manual PRN    Hilda Blades, PT, DPT, LAT, ATC 01/17/22  4:51 PM Phone: [phone] Fax: [phone]

## 2022-01-19 ENCOUNTER — Ambulatory Visit: Payer: 59 | Admitting: Physical Therapy

## 2022-01-19 ENCOUNTER — Encounter: Payer: Self-pay | Admitting: Physical Therapy

## 2022-01-19 ENCOUNTER — Other Ambulatory Visit: Payer: Self-pay

## 2022-01-19 DIAGNOSIS — M542 Cervicalgia: Secondary | ICD-10-CM | POA: Diagnosis not present

## 2022-01-19 DIAGNOSIS — M6281 Muscle weakness (generalized): Secondary | ICD-10-CM | POA: Diagnosis not present

## 2022-01-19 DIAGNOSIS — M545 Low back pain, unspecified: Secondary | ICD-10-CM | POA: Diagnosis not present

## 2022-01-19 NOTE — Therapy (Signed)
OUTPATIENT PHYSICAL THERAPY TREATMENT NOTE   Patient Name: [name] MRN: [id number] DOB:October 17, 1981, 41 y.o., female Today's Date: 01/19/2022  PCP: Caren Macadam, MD REFERRING PROVIDER: Garald Balding, MD   PT End of Session - 01/19/22 1607     Visit Number 4    Number of Visits 16    Date for PT Re-Evaluation 02/28/22    Authorization Type UMR    PT Start Time 1610    PT Stop Time 1652    PT Time Calculation (min) 42 min    Activity Tolerance Patient tolerated treatment well    Behavior During Therapy [facility] for tasks assessed/performed              Past Medical History:  Diagnosis Date   Hypertension    IBS (irritable bowel syndrome) 04/13/2014   Sickle cell trait ([place])    History reviewed. No pertinent surgical history. Patient Active Problem List   Diagnosis Date Noted   Lactose intolerance 06/27/2019   GERD (gastroesophageal reflux disease) 06/27/2019   Environmental allergies 11/15/2018   Bronchitis 11/15/2018   Wheezing 11/15/2018   Cough 11/15/2018   Chronic pain of right ankle 08/06/2017   Localized osteoarthritis of right ankle 08/06/2017   Syncope 11/25/2014   Leiomyoma of uterus 04/13/2014   Amenorrhea due to Depo Provera 04/13/2014    REFERRING PROVIDER: Garald Balding, MD   REFERRING DIAG: Acute bilateral low back pain with right-sided sciatica, Neck pain  THERAPY DIAG:  Low back pain without sciatica, unspecified back pain laterality, unspecified chronicity  Muscle weakness  Cervicalgia  PERTINENT HISTORY: None  PRECAUTIONS: None   SUBJECTIVE:  Pt reports that she had an episode at work where she had a shooting pain into her R leg and almost fell down.  That pain was around a 10/10 but very brief.  She feels that PT is helping and she is progressing.  PAIN:  Are you having pain? Yes NPRS scale: 5/10 Pain location: Back Pain orientation: Lower  PAIN TYPE: Chronic Pain description: intermittent, sore,  tight Aggravating factors: standing and sitting for long periods (30 -> 45 min) Relieving factors: gentle movement  Patient Goals: make back feel like normal, reduce pain   OBJECTIVE: (BOLDED MEASURES ASSESSED THIS VISIT) GENERAL OBSERVATION:            Increased anterior pelvic tilt   LUMBAR AROM   AROM AROM  01/03/2022  Flexion WNL, w/ concordant pain  Extension WNL, w/ concordant pain  Right lateral flexion WNL  Left lateral flexion WNL  Right rotation WNL  Left rotation WNL    LE MMT:   MMT Right 01/03/2022 Left 01/03/2022 Rt/ Lt 01/09/2022 Rt / Lt 01/17/2022  Hip flexion (L2, L3)        Knee extension (L3)        Knee flexion        Hip abduction 3+ 4 3+ / 4   Hip extension 3* 3*  3+ / 3+  Hip external rotation        Hip internal rotation        Hip adduction        Ankle dorsiflexion (L4)        Ankle plantarflexion (S1)        Ankle inversion        Ankle eversion        Great Toe ext (L5)         (score listed is out of 5 possible points.  N = WNL, D = diminished, C = clear for gross weakness with myotome testing, * = concordant pain with testing)    PALPATION:            TTP bil lumbar paraspinals and R QL   PATIENT SURVEYS:  FOTO 66 - > 74     TODAY'S TREATMENT:   01/19/2022:  Therapeutic Exercise: NuStep L6 x 5 min with LE while taking subjective Supine SKTC stretch 2 x 20 sec each Thomas stretch with passive knee flexion 2 x 60 sec LTR 14x Posterior pelvic tilt 3x10 with belt and foam roler PPT with alternating clamshell using greenTB x10 ea Hip adduction with pilates ring in hooklying 2x10 Staggered bridge  x 10 with 5 sec hold  Manual therapy: CPA and UPA G III and G IV, pt in prone     01/17/2022:  Therapeutic Exercise: NuStep L6 x 5 min with LE while taking subjective Supine SKTC stretch 2 x 20 sec each Piriformis stretch 2 x 20 sec each Thomas stretch with passive knee flexion 2 x 60 sec LTR 5 x 5 sec each Posterior pelvic tilt 10 x 5  sec hold PPT with alternating march x 10 PPT with clamshell using green x 10 Bridge 2 x 10 with 3 sec hold   01/09/2022: Therapeutic Exercise: NuStep L6 x 4 min while taking subjective LTR 3 x 10 sec hold each Piriformis stretch 2 x 30 sec each Child's pose stretch 2 x 15 sec fwd, 2 x 15 sec each side  Cat cow 2 x 5 Resisted PPT using belt 2 x 5 with 5 sec hold Bridge 2 x 10 SLR 2 x 10 each Side clamshell with green 2 x 15 each Sit to stand holding 10# at chest 2 x 10  PATIENT EDUCATION:  HEP update. Pt educated via explanation, demonstration, handout. Pt confirms understanding verbally.    HOME EXERCISE PROGRAM: Access Code: [email or web address]    ASSESSMENT: CLINICAL IMPRESSION: Tekeshia is progressing well with therapy.  Pt reports mild pain reduction following therapy.  Today we concentrated on core strengthening and hip strengthening.  We were able to progress intensity of mat exercises today.  Pt shows significant rec fem tightness bil.  Will monitor if CPA and UPA has lasting effect; slight reduction in pain following manual today.  Pt will continue to benefit from skilled physical therapy to address remaining deficits and achieve listed goals.  Continue per POC.     GOALS: SHORT TERM GOALS:   STG Name Target Date Goal status  1 Nathalia will be >75% HEP compliant to improve carryover between sessions and facilitate independent management of condition   Baseline: progressing with HEP 01/24/2022 ONGOING    LONG TERM GOALS:    LTG Name Target Date Goal status  1 Omya will improve FOTO score from 66 (baseline) to 74 as a proxy for functional improvement 02/28/2022 INITIAL  2 Blakelynn will report >/= 50% decrease in pain from evaluation    Baseline: 10/10 max pain 02/28/2022 INITIAL  3 Glora will improve the following MMTs to >/= 4/5 to show improvement in strength:  hip ext and abduction    Baseline: see chart in note 02/28/2022 INITIAL  4 Sierria will be able to  stand for >120 min at work, not limited by pain    Baseline: 30 min 02/28/2022 INITIAL     PLAN: PT FREQUENCY: 1-2x/week   PT DURATION: 8 weeks (Ending 02/28/2022)   PLANNED INTERVENTIONS: Therapeutic exercises, Therapeutic activity,  Neuro Muscular re-education, Gait training, Patient/Family education, Joint mobilization, Dry Needling, Electrical stimulation, Spinal mobilization and/or manipulation, Moist heat, Taping, Vasopneumatic device, Ionotophoresis 4mg /ml Dexamethasone, and Manual therapy   PLAN FOR NEXT SESSION: progressive lumbar and hip strengthening, TDN and manual PRN    Mathis Dad PT 4:08 PM 01/19/2022

## 2022-01-23 NOTE — Therapy (Signed)
OUTPATIENT PHYSICAL THERAPY TREATMENT NOTE   Patient Name: [name] MRN: [id number] DOB:21-Feb-1981, 41 y.o., female Today's Date: 01/24/2022  PCP: Victoria Macadam, MD REFERRING PROVIDER: Caren Macadam, MD   PT End of Session - 01/24/22 1621     Visit Number 5    Number of Visits 16    Date for PT Re-Evaluation 02/28/22    Authorization Type UMR    PT Start Time 1615    PT Stop Time 1655    PT Time Calculation (min) 40 min    Activity Tolerance Patient tolerated treatment well    Behavior During Therapy [facility] for tasks assessed/performed               Past Medical History:  Diagnosis Date   Hypertension    IBS (irritable bowel syndrome) 04/13/2014   Sickle cell trait ([place])    History reviewed. No pertinent surgical history. Patient Active Problem List   Diagnosis Date Noted   Lactose intolerance 06/27/2019   GERD (gastroesophageal reflux disease) 06/27/2019   Environmental allergies 11/15/2018   Bronchitis 11/15/2018   Wheezing 11/15/2018   Cough 11/15/2018   Chronic pain of right ankle 08/06/2017   Localized osteoarthritis of right ankle 08/06/2017   Syncope 11/25/2014   Leiomyoma of uterus 04/13/2014   Amenorrhea due to Depo Provera 04/13/2014    REFERRING PROVIDER: Garald Balding, MD   REFERRING DIAG: Acute bilateral low back pain with right-sided sciatica, Neck pain  THERAPY DIAG:  Low back pain without sciatica, unspecified back pain laterality, unspecified chronicity  Muscle weakness  Cervicalgia  PERTINENT HISTORY: None  PRECAUTIONS: None   SUBJECTIVE: Patient reports she hasn't been too bad since last visit. Her exercises are still going well at home.   PAIN:  Are you having pain? Yes NPRS scale: 1/10 Pain location: Back Pain orientation: Lower  PAIN TYPE: Chronic Pain description: intermittent, sore, tight Aggravating factors: standing and sitting for long periods (30 -> 45 min) Relieving factors: gentle  movement  Patient Goals: make back feel like normal, reduce pain   OBJECTIVE: (BOLDED MEASURES ASSESSED THIS VISIT) GENERAL OBSERVATION:            Increased anterior pelvic tilt   LUMBAR AROM   AROM AROM  01/03/2022  01/24/2022  Flexion WNL, w/ concordant pain WNL  Extension WNL, w/ concordant pain WNL - right sided lumbar tightness  Right lateral flexion WNL WNL  Left lateral flexion WNL WNL  Right rotation WNL WNL  Left rotation WNL WNL    LE MMT:   MMT Right 01/03/2022 Left 01/03/2022 Rt/ Lt 01/09/2022 Rt / Lt 01/17/2022  Hip flexion (L2, L3)        Knee extension (L3)        Knee flexion        Hip abduction 3+ 4 3+ / 4   Hip extension 3* 3*  3+ / 3+  Hip external rotation        Hip internal rotation        Hip adduction        Ankle dorsiflexion (L4)        Ankle plantarflexion (S1)        Ankle inversion        Ankle eversion        Great Toe ext (L5)         (score listed is out of 5 possible points.  N = WNL, D = diminished, C = clear for gross  weakness with myotome testing, * = concordant pain with testing)    PALPATION:  Tenderness of right lumbar paraspinals and QL with increased muscular tension compared to left   PATIENT SURVEYS:  FOTO 66 - > 74     TODAY'S TREATMENT:  01/24/2022:  Therapeutic Exercise: NuStep L6 x 5 min with LE while taking subjective Child's pose stretch 2 x 30 sec forward, 2 x 30 sec each side Prone quad stretch 3 x 30 sec each Cat cow x 5 Bird dog 2 x 10 with 5 sec hold Bridge with overhead band hold using red 2 x 10 with 3 sec hold 90-90 alternating heel tap with overhead band hold using red 2 x 10    01/19/2022:  Therapeutic Exercise: NuStep L6 x 5 min with LE while taking subjective Supine SKTC stretch 2 x 20 sec each Thomas stretch with passive knee flexion 2 x 60 sec LTR 14x Posterior pelvic tilt 3x10 with belt and foam roler PPT with alternating clamshell using greenTB x10 ea Hip adduction with pilates ring in  hooklying 2x10 Staggered bridge  x 10 with 5 sec hold Manual therapy: CPA and UPA G III and G IV, pt in prone    01/17/2022:  Therapeutic Exercise: NuStep L6 x 5 min with LE while taking subjective Supine SKTC stretch 2 x 20 sec each Piriformis stretch 2 x 20 sec each Thomas stretch with passive knee flexion 2 x 60 sec LTR 5 x 5 sec each Posterior pelvic tilt 10 x 5 sec hold PPT with alternating march x 10 PPT with clamshell using green x 10 Bridge 2 x 10 with 3 sec hold  PATIENT EDUCATION:  HEP. Pt educated via explanation, demonstration, handout. Pt confirms understanding verbally.    HOME EXERCISE PROGRAM: Access Code: [email or web address]    ASSESSMENT: CLINICAL IMPRESSION: Patient tolerated therapy well with no adverse effects. Patient continues to demonstrate grossly [employer] lumbar motion but noted right sided muscular tightness this visit. Patient deferred having TPDN treatment this visit but was encouraged to think about this for future sessions to reduce muscular pain. Therapy continues to progress stretching for flexibility limitations and progression of core/hip strengthening to improve lumbopelvic control. She continues to demonstrate increased lumbar lordosis but was able to progress with core strengthening this visit. Patient will benefit from continued skilled therapy to address pain and the listed deficits in order to achieve functional goals, enable safety and independence in completion of daily tasks, and return to PLOF.  Patient presents with pain and impairments/deficits in: pain-free lumbar ROM, hip and core strength.       GOALS: SHORT TERM GOALS:   STG Name Target Date Goal status  1 [name] will be >75% HEP compliant to improve carryover between sessions and facilitate independent management of condition   Baseline: progressing with HEP 01/24/2022 ONGOING    LONG TERM GOALS:    LTG Name Target Date Goal status  1 [name] will improve FOTO score from 66 (baseline) to  74 as a proxy for functional improvement 02/28/2022 INITIAL  2 [name] will report >/= 50% decrease in pain from evaluation    Baseline: 10/10 max pain 02/28/2022 INITIAL  3 [name] will improve the following MMTs to >/= 4/5 to show improvement in strength:  hip ext and abduction    Baseline: see chart in note 02/28/2022 INITIAL  4 Javona will be able to stand for >120 min at work, not limited by pain    Baseline: 30 min 02/28/2022 INITIAL  PLAN: PT FREQUENCY: 1-2x/week   PT DURATION: 8 weeks (Ending 02/28/2022)   PLANNED INTERVENTIONS: Therapeutic exercises, Therapeutic activity, Neuro Muscular re-education, Gait training, Patient/Family education, Joint mobilization, Dry Needling, Electrical stimulation, Spinal mobilization and/or manipulation, Moist heat, Taping, Vasopneumatic device, Ionotophoresis 4mg /ml Dexamethasone, and Manual therapy   PLAN FOR NEXT SESSION: progressive lumbar and hip strengthening, TDN and manual PRN   Hilda Blades, PT, DPT, LAT, ATC 01/24/22  4:58 PM Phone: [phone] Fax: [phone]

## 2022-01-24 ENCOUNTER — Encounter: Payer: Self-pay | Admitting: Physical Therapy

## 2022-01-24 ENCOUNTER — Other Ambulatory Visit: Payer: Self-pay

## 2022-01-24 ENCOUNTER — Ambulatory Visit: Payer: 59 | Admitting: Physical Therapy

## 2022-01-24 DIAGNOSIS — M6281 Muscle weakness (generalized): Secondary | ICD-10-CM | POA: Diagnosis not present

## 2022-01-24 DIAGNOSIS — M542 Cervicalgia: Secondary | ICD-10-CM | POA: Diagnosis not present

## 2022-01-24 DIAGNOSIS — M545 Low back pain, unspecified: Secondary | ICD-10-CM

## 2022-01-26 ENCOUNTER — Ambulatory Visit: Payer: 59 | Admitting: Physical Therapy

## 2022-01-26 ENCOUNTER — Encounter: Payer: Self-pay | Admitting: Physical Therapy

## 2022-01-26 ENCOUNTER — Other Ambulatory Visit: Payer: Self-pay

## 2022-01-26 DIAGNOSIS — M542 Cervicalgia: Secondary | ICD-10-CM

## 2022-01-26 DIAGNOSIS — M545 Low back pain, unspecified: Secondary | ICD-10-CM

## 2022-01-26 DIAGNOSIS — M6281 Muscle weakness (generalized): Secondary | ICD-10-CM

## 2022-01-26 NOTE — Therapy (Signed)
OUTPATIENT PHYSICAL THERAPY TREATMENT NOTE   Patient Name: [name] MRN: [id number] DOB:[birth year], 41 y.o., female Today's Date: 01/26/2022  PCP: Caren Macadam, MD REFERRING PROVIDER: Garald Balding, MD   PT End of Session - 01/26/22 1615     Visit Number 6    Number of Visits 16    Date for PT Re-Evaluation 02/28/22    Authorization Type UMR    PT Start Time 1615    PT Stop Time 1658    PT Time Calculation (min) 43 min    Activity Tolerance Patient tolerated treatment well    Behavior During Therapy [facility] for tasks assessed/performed               Past Medical History:  Diagnosis Date   Hypertension    IBS (irritable bowel syndrome) 04/13/2014   Sickle cell trait ([place])    History reviewed. No pertinent surgical history. Patient Active Problem List   Diagnosis Date Noted   Lactose intolerance 06/27/2019   GERD (gastroesophageal reflux disease) 06/27/2019   Environmental allergies 11/15/2018   Bronchitis 11/15/2018   Wheezing 11/15/2018   Cough 11/15/2018   Chronic pain of right ankle 08/06/2017   Localized osteoarthritis of right ankle 08/06/2017   Syncope 11/25/2014   Leiomyoma of uterus 04/13/2014   Amenorrhea due to Depo Provera 04/13/2014    REFERRING PROVIDER: Garald Balding, MD   REFERRING DIAG: Acute bilateral low back pain with right-sided sciatica, Neck pain  THERAPY DIAG:  Low back pain without sciatica, unspecified back pain laterality, unspecified chronicity  Muscle weakness  Cervicalgia  PERTINENT HISTORY: None  PRECAUTIONS: None   SUBJECTIVE:  Pt reports that her back pain is slowly improving. She had a very busy day today.   PAIN:  Are you having pain? Yes NPRS scale: 4-5/10 Pain location: Back Pain orientation: Lower  PAIN TYPE: Chronic Pain description: intermittent, sore, tight Aggravating factors: standing and sitting for long periods (30 -> 45 min) Relieving factors: gentle movement  Patient  Goals: make back feel like normal, reduce pain   OBJECTIVE: (BOLDED MEASURES ASSESSED THIS VISIT) GENERAL OBSERVATION:            Increased anterior pelvic tilt   LUMBAR AROM   AROM AROM  01/03/2022  01/24/2022  Flexion WNL, w/ concordant pain WNL  Extension WNL, w/ concordant pain WNL - right sided lumbar tightness  Right lateral flexion WNL WNL  Left lateral flexion WNL WNL  Right rotation WNL WNL  Left rotation WNL WNL    LE MMT:   MMT Right 01/03/2022 Left 01/03/2022 Rt/ Lt 01/09/2022 Rt / Lt 01/17/2022  Hip flexion (L2, L3)        Knee extension (L3)        Knee flexion        Hip abduction 3+ 4 3+ / 4   Hip extension 3* 3*  3+ / 3+  Hip external rotation        Hip internal rotation        Hip adduction        Ankle dorsiflexion (L4)        Ankle plantarflexion (S1)        Ankle inversion        Ankle eversion        Great Toe ext (L5)         (score listed is out of 5 possible points.  N = WNL, D = diminished, C = clear for gross weakness  with myotome testing, * = concordant pain with testing)    PALPATION:  Tenderness of right lumbar paraspinals and QL with increased muscular tension compared to left   PATIENT SURVEYS:  FOTO 66 - > 74     TODAY'S TREATMENT:  01/26/2022:  Therapeutic Exercise: NuStep L6 x 5 min with LE only, while taking subjective Thomas stretch with passive knee flexion 2 x 60 sec (L only) LTR 14x Posterior pelvic tilt 2x20 with belt and foam roller PPT with alternating clamshell using greenTB x10 ea Hip adduction with pilates ring in hooklying 2x10 Staggered bridge  x5 with 10 sec hold Bird dog - 10x 10'' hold  Manual therapy: CPA and UPA G III and G IV, pt in prone STM with percussion device R QL and R lumbar paraspinals   01/24/2022:  Therapeutic Exercise: NuStep L6 x 5 min with LE while taking subjective Child's pose stretch 2 x 30 sec forward, 2 x 30 sec each side Prone quad stretch 3 x 30 sec each Cat cow x 5 Bird dog 2 x  10 with 5 sec hold Bridge with overhead band hold using red 2 x 10 with 3 sec hold 90-90 alternating heel tap with overhead band hold using red 2 x 10    01/19/2022:  Therapeutic Exercise: NuStep L6 x 5 min with LE while taking subjective Supine SKTC stretch 2 x 20 sec each Thomas stretch with passive knee flexion 2 x 60 sec LTR 14x Posterior pelvic tilt 3x10 with belt and foam roler PPT with alternating clamshell using greenTB x10 ea Hip adduction with pilates ring in hooklying 2x10 Staggered bridge  x 10 with 5 sec hold Manual therapy: CPA and UPA G III and G IV, pt in prone    01/17/2022:  Therapeutic Exercise: NuStep L6 x 5 min with LE while taking subjective Supine SKTC stretch 2 x 20 sec each Piriformis stretch 2 x 20 sec each Thomas stretch with passive knee flexion 2 x 60 sec LTR 5 x 5 sec each Posterior pelvic tilt 10 x 5 sec hold PPT with alternating march x 10 PPT with clamshell using green x 10 Bridge 2 x 10 with 3 sec hold  PATIENT EDUCATION:  HEP. Pt educated via explanation, demonstration, handout. Pt confirms understanding verbally.    HOME EXERCISE PROGRAM: Access Code: [email or web address]    ASSESSMENT: CLINICAL IMPRESSION: Rosea is progressing well with therapy.  Pt reports significant pain reduction following therapy (>/= 2 pts NPRS).  Today we concentrated on core strengthening and hip strengthening.  Pt progressing core and hip strength and endurance as expected.  MT + exercise reduced pain from 5/10 to 1/10 at end of session.  Pt hesitant to try TDN.  Pt will continue to benefit from skilled physical therapy to address remaining deficits and achieve listed goals.  Continue per POC.  Patient presents with pain and impairments/deficits in: pain-free lumbar ROM, hip and core strength.       GOALS: SHORT TERM GOALS:   STG Name Target Date Goal status  1 Tynika will be >75% HEP compliant to improve carryover between sessions and facilitate independent  management of condition   Baseline: progressing with HEP 01/24/2022 ONGOING    LONG TERM GOALS:    LTG Name Target Date Goal status  1 Yuriko will improve FOTO score from 66 (baseline) to 74 as a proxy for functional improvement 02/28/2022 INITIAL  2 Lyliana will report >/= 50% decrease in pain from evaluation    Baseline:  10/10 max pain 02/28/2022 INITIAL  3 Coryn will improve the following MMTs to >/= 4/5 to show improvement in strength:  hip ext and abduction    Baseline: see chart in note 02/28/2022 INITIAL  4 Jaanai will be able to stand for >120 min at work, not limited by pain    Baseline: 30 min 02/28/2022 INITIAL     PLAN: PT FREQUENCY: 1-2x/week   PT DURATION: 8 weeks (Ending 02/28/2022)   PLANNED INTERVENTIONS: Therapeutic exercises, Therapeutic activity, Neuro Muscular re-education, Gait training, Patient/Family education, Joint mobilization, Dry Needling, Electrical stimulation, Spinal mobilization and/or manipulation, Moist heat, Taping, Vasopneumatic device, Ionotophoresis 4mg /ml Dexamethasone, and Manual therapy   PLAN FOR NEXT SESSION: progressive lumbar and hip strengthening, TDN and manual PRN  Mathis Dad PT 01/26/22  5:13 PM Phone: [phone] Fax: [phone]

## 2022-01-30 ENCOUNTER — Ambulatory Visit: Payer: 59 | Admitting: Physical Therapy

## 2022-01-30 ENCOUNTER — Encounter: Payer: Self-pay | Admitting: Physical Therapy

## 2022-01-30 ENCOUNTER — Other Ambulatory Visit: Payer: Self-pay

## 2022-01-30 DIAGNOSIS — M6281 Muscle weakness (generalized): Secondary | ICD-10-CM | POA: Diagnosis not present

## 2022-01-30 DIAGNOSIS — M542 Cervicalgia: Secondary | ICD-10-CM | POA: Diagnosis not present

## 2022-01-30 DIAGNOSIS — M545 Low back pain, unspecified: Secondary | ICD-10-CM

## 2022-01-30 NOTE — Therapy (Signed)
OUTPATIENT PHYSICAL THERAPY TREATMENT NOTE   Patient Name: [name] MRN: [id number] DOB:10/22/1981, 41 y.o., female Today's Date: 01/30/2022  PCP: Caren Macadam, MD REFERRING PROVIDER: Caren Macadam, MD   PT End of Session - 01/30/22 1621     Visit Number 7    Number of Visits 16    Date for PT Re-Evaluation 02/28/22    Authorization Type UMR    PT Start Time 1615    PT Stop Time 1700    PT Time Calculation (min) 45 min    Activity Tolerance Patient tolerated treatment well    Behavior During Therapy [facility] for tasks assessed/performed                Past Medical History:  Diagnosis Date   Hypertension    IBS (irritable bowel syndrome) 04/13/2014   Sickle cell trait ([place])    History reviewed. No pertinent surgical history. Patient Active Problem List   Diagnosis Date Noted   Lactose intolerance 06/27/2019   GERD (gastroesophageal reflux disease) 06/27/2019   Environmental allergies 11/15/2018   Bronchitis 11/15/2018   Wheezing 11/15/2018   Cough 11/15/2018   Chronic pain of right ankle 08/06/2017   Localized osteoarthritis of right ankle 08/06/2017   Syncope 11/25/2014   Leiomyoma of uterus 04/13/2014   Amenorrhea due to Depo Provera 04/13/2014    REFERRING PROVIDER: Garald Balding, MD   REFERRING DIAG: Acute bilateral low back pain with right-sided sciatica, Neck pain  THERAPY DIAG:  Low back pain without sciatica, unspecified back pain laterality, unspecified chronicity  Muscle weakness  Cervicalgia  PERTINENT HISTORY: None  PRECAUTIONS: None   SUBJECTIVE: Patient reports she was busy at work over the weekend but was able to rest a little more today. She reports pain is down today.  PAIN:  Are you having pain? Yes NPRS scale: 4/10 Pain location: Back Pain orientation: Lower  PAIN TYPE: Chronic Pain description: intermittent, sore, tight Aggravating factors: standing and sitting for long periods (30 -> 45  min) Relieving factors: gentle movement  Patient Goals: make back feel like normal, reduce pain   OBJECTIVE: (BOLDED MEASURES ASSESSED THIS VISIT) GENERAL OBSERVATION:            Increased anterior pelvic tilt   LUMBAR AROM   AROM AROM  01/03/2022  01/24/2022  Flexion WNL, w/ concordant pain WNL  Extension WNL, w/ concordant pain WNL - right sided lumbar tightness  Right lateral flexion WNL WNL  Left lateral flexion WNL WNL  Right rotation WNL WNL  Left rotation WNL WNL    LE MMT:   MMT Right 01/03/2022 Left 01/03/2022 Rt/ Lt 01/09/2022 Rt / Lt 01/17/2022  Hip flexion (L2, L3)        Knee extension (L3)        Knee flexion        Hip abduction 3+ 4 3+ / 4   Hip extension 3* 3*  3+ / 3+  Hip external rotation        Hip internal rotation        Hip adduction        Ankle dorsiflexion (L4)        Ankle plantarflexion (S1)        Ankle inversion        Ankle eversion        Great Toe ext (L5)         (score listed is out of 5 possible points.  N = WNL, D =  diminished, C = clear for gross weakness with myotome testing, * = concordant pain with testing)    PALPATION:  Tenderness of right lumbar paraspinals and QL with increased muscular tension compared to left   PATIENT SURVEYS:  FOTO  76% functional status neck (66 initial - > 74 predicted)     TODAY'S TREATMENT:  01/30/2022: Therapeutic Exercise: NuStep L6 x 5 min with LE only, while taking subjective LTR 10 x 5 sec each Thomas stretch with passive knee flexion 2 x 60 sec each Posterior pelvic tilt with belt and FR 10 x 5 sec hold Bridge 2 x 10 90-90 alternating heel tap 2 x 10 Sidelying hip abduction 2 x 10 each Bird dog 2 x 10 Modified front plank on knees 3 x 20 sec   01/26/2022:  Therapeutic Exercise: NuStep L6 x 5 min with LE only, while taking subjective Thomas stretch with passive knee flexion 2 x 60 sec (L only) LTR 14x Posterior pelvic tilt 2x20 with belt and foam roller PPT with alternating  clamshell using greenTB x10 ea Hip adduction with pilates ring in hooklying 2x10 Staggered bridge  x5 with 10 sec hold Bird dog - 10x 10'' hold Manual therapy: CPA and UPA G III and G IV, pt in prone STM with percussion device R QL and R lumbar paraspinals   01/24/2022:  Therapeutic Exercise: NuStep L6 x 5 min with LE while taking subjective Child's pose stretch 2 x 30 sec forward, 2 x 30 sec each side Prone quad stretch 3 x 30 sec each Cat cow x 5 Bird dog 2 x 10 with 5 sec hold Bridge with overhead band hold using red 2 x 10 with 3 sec hold 90-90 alternating heel tap with overhead band hold using red 2 x 10   01/19/2022:  Therapeutic Exercise: NuStep L6 x 5 min with LE while taking subjective Supine SKTC stretch 2 x 20 sec each Thomas stretch with passive knee flexion 2 x 60 sec LTR 14x Posterior pelvic tilt 3x10 with belt and foam roler PPT with alternating clamshell using greenTB x10 ea Hip adduction with pilates ring in hooklying 2x10 Staggered bridge  x 10 with 5 sec hold Manual therapy: CPA and UPA G III and G IV, pt in prone    PATIENT EDUCATION:  HEP. Pt educated via explanation, demonstration. Pt confirms understanding verbally.    HOME EXERCISE PROGRAM: Access Code: [email or web address]    ASSESSMENT: CLINICAL IMPRESSION: Patient tolerated therapy well with no adverse effects. Therapy continues to focus on improving lumbar mobility and progress gross core and hip strengthening. She does seem to be progressing well with therapy and reports improvement in functional level based on her FOTO score. Patient required cueing this visit for lumbopelvic control to avoid excessive lumbar lordosis which does increase her low back discomfort. She notes continued improvement in her low back discomfort following therapy. Patient will schedule for 4 more weeks this visit at a reduced frequency of 1x/week. No changes to HEP this visit. Patient will benefit from continued skilled therapy to  address pain and the listed deficits in order to achieve functional goals, enable safety and independence in completion of daily tasks, and return to PLOF.  Patient presents with pain and impairments/deficits in: pain-free lumbar ROM, hip and core strength.       GOALS: SHORT TERM GOALS:   STG Name Target Date Goal status  1 Glanda will be >75% HEP compliant to improve carryover between sessions and facilitate independent management of  condition   Baseline: patient reports consistency with exercises 01/24/2022 ACHIEVED    LONG TERM GOALS:    LTG Name Target Date Goal status  1 Missey will improve FOTO score from 66 (baseline) to 74 as a proxy for functional improvement  Baseline: 66 -> 76 02/28/2022 ACHIEVED  2 Raniya will report >/= 50% decrease in pain from evaluation    Baseline: 4-5/10 max pain 02/28/2022 ONGOING  3 Shayla will improve the following MMTs to >/= 4/5 to show improvement in strength:  hip ext and abduction    Baseline: see chart in note 02/28/2022 INITIAL  4 Annisa will be able to stand for >120 min at work, not limited by pain    Baseline: 30 min 02/28/2022 INITIAL     PLAN: PT FREQUENCY: 1-2x/week   PT DURATION: 8 weeks (Ending 02/28/2022)   PLANNED INTERVENTIONS: Therapeutic exercises, Therapeutic activity, Neuro Muscular re-education, Gait training, Patient/Family education, Joint mobilization, Dry Needling, Electrical stimulation, Spinal mobilization and/or manipulation, Moist heat, Taping, Vasopneumatic device, Ionotophoresis 4mg /ml Dexamethasone, and Manual therapy   PLAN FOR NEXT SESSION: progressive lumbar and hip strengthening, TDN and manual PRN   Hilda Blades, PT, DPT, LAT, ATC 01/30/22  5:03 PM Phone: [phone] Fax: [phone]

## 2022-02-08 ENCOUNTER — Ambulatory Visit: Payer: 59 | Admitting: Physical Therapy

## 2022-02-10 ENCOUNTER — Ambulatory Visit: Payer: 59 | Admitting: Family Medicine

## 2022-02-13 NOTE — Therapy (Signed)
?OUTPATIENT PHYSICAL THERAPY TREATMENT NOTE ? ? ?Patient Name: [name] ?MRN: [id number] ?DOB:11/04/1981, 41 y.o., female ?Today's Date: 02/15/2022 ? ?PCP: Caren Macadam, MD ?REFERRING PROVIDER: Garald Balding, MD ? ? PT End of Session - 02/15/22 1632   ? ? Visit Number 8   ? Number of Visits 16   ? Date for PT Re-Evaluation 02/28/22   ? Authorization Type UMR   ? PT Start Time 1620   ? PT Stop Time 1700   ? PT Time Calculation (min) 40 min   ? Activity Tolerance Patient tolerated treatment well   ? Behavior During Therapy [facility] for tasks assessed/performed   ? ?  ?  ? ?  ? ? ? ? ? ? ?Past Medical History:  ?Diagnosis Date  ? Hypertension   ? IBS (irritable bowel syndrome) 04/13/2014  ? Sick[place] cell trait ([place])   ? ?History reviewed. No pertinent surgical history. ?Patient Active Problem List  ? Diagnosis Date Noted  ? Lactose intolerance 06/27/2019  ? GERD (gastroesophageal reflux disease) 06/27/2019  ? Environmental allergies 11/15/2018  ? Bronchitis 11/15/2018  ? Wheezing 11/15/2018  ? Cough 11/15/2018  ? Chronic pain of right ankle 08/06/2017  ? Localized osteoarthritis of right ankle 08/06/2017  ? Syncope 11/25/2014  ? Leiomyoma of uterus 04/13/2014  ? Amenorrhea due to Depo Provera 04/13/2014  ? ? ?REFERRING PROVIDER: Garald Balding, MD ?  ?REFERRING DIAG: Acute bilateral low back pain with right-sided sciatica, Neck pain ? ?THERAPY DIAG:  ?Low back pain without sciatica, unspecified back pain laterality, unspecified chronicity ? ?Muscle weakness ? ?Cervicalgia ? ?PERTINENT HISTORY: None ? ?PRECAUTIONS: None ? ? ?SUBJECTIVE: Patient reports she is having more pain today and pain into the right leg. She reports that it could be from standing too much and the hard floors at work causing increased right sided low back pain and right thigh.  ? ?PAIN:  ?Are you having pain? Yes ?NPRS scale: 9/10 ?Pain location: Back, right leg ?Pain orientation: Lower ?PAIN TYPE: Chronic ?Pain description:  intermittent, pressure ?Aggravating factors: standing and sitting for long periods (30 -> 45 min) ?Relieving factors: gentle movement ? ?Patient Goals: make back feel like normal, reduce pain ? ? ?OBJECTIVE: (BOLDED MEASURES ASSESSED THIS VISIT) ?GENERAL OBSERVATION: ?           Increased anterior pelvic tilt ?  ?LUMBAR AROM ?  ?AROM AROM  ?01/03/2022  ?01/24/2022  ?02/15/2022  ?Flexion WNL, w/ concordant pain WNL WNL  ?Extension WNL, w/ concordant pain WNL - right sided lumbar tightness WNL - right sided lumbar tightness  ?Right lateral flexion WNL WNL WNL  ?Left lateral flexion WNL WNL WNL  ?Right rotation WNL WNL WNL  ?Left rotation WNL WNL WNL  ?  ?LE MMT: ?  ?MMT Right ?01/03/2022 Left ?01/03/2022 Rt/ Lt ?01/09/2022 Rt / Lt ?01/17/2022 Rt / Lt ?02/15/2022  ?Hip flexion (L2, L3)         ?Knee extension (L3)         ?Knee flexion         ?Hip abduction 3+ 4 3+ / 4  3+ / 4  ?Hip extension 3* 3*  3+ / 3+ 3+ / 3+  ?Hip external rotation         ?Hip internal rotation         ?Hip adduction         ?Ankle dorsiflexion (L4)         ?Ankle plantarflexion (S1)         ?  Ankle inversion         ?Ankle eversion         ?Great Toe ext (L5)         ? (score listed is out of 5 possible points.  N = WNL, D = diminished, C = clear for gross weakness with myotome testing, * = concordant pain with testing) ?  ? PALPATION:  ?Tenderness of right lumbar paraspinals and QL with increased muscular tension compared to left ?  ?PATIENT SURVEYS:  ?FOTO  76% functional status neck -01/30/2022 (66 initial - > 74 predicted) ?  ?  ?TODAY'S TREATMENT: ?02/15/2022: ?Therapeutic Exercise: ?NuStep L5 x 5 min with LE only, while taking subjective ?Thomas stretch with passive knee flexion 2 x 60 sec each ?Piriformis stretch 2 x 30 sec each ?LTR x 10 ?Figure-4 LTR x 6 each ?Child's pose stretch 2 x 20 sec forward, 2 x 20 sec each side ?Posterior pelvic tilt with belt and FR 10 x 5 sec hold ?Clamshell with green 2 x 15 each ?Modified side plank on knees 3 x 15  sec each ?Modified front plank on knees 3 x 20 sec ?Bird dog 2 x 10 ? ? ?01/30/2022: ?Therapeutic Exercise: ?NuStep L6 x 5 min with LE only, while taking subjective ?LTR 10 x 5 sec each ?Thomas stretch with passive knee flexion 2 x 60 sec each ?Posterior pelvic tilt with belt and FR 10 x 5 sec hold ?Bridge 2 x 10 ?90-90 alternating heel tap 2 x 10 ?Sidelying hip abduction 2 x 10 each ?Bird dog 2 x 10 ?Modified front plank on knees 3 x 20 sec ? ?01/26/2022: ? Therapeutic Exercise: ?NuStep L6 x 5 min with LE only, while taking subjective ?Thomas stretch with passive knee flexion 2 x 60 sec (L only) ?LTR 14x ?Posterior pelvic tilt 2x20 with belt and foam roller ?PPT with alternating clamshell using greenTB x10 ea ?Hip adduction with pilates ring in hooklying 2x10 ?Staggered bridge  x5 with 10 sec hold ?Bird dog - 10x 10'' hold ?Manual therapy: ?CPA and UPA G III and G IV, pt in prone ?STM with percussion device R QL and R lumbar paraspinals  ? ?PATIENT EDUCATION:  ?HEP. Pt educated via explanation, demonstration. Pt confirms understanding verbally.  ?  ?HOME EXERCISE PROGRAM: ?Access Code: [email or web address] ? ?  ?ASSESSMENT: ?CLINICAL IMPRESSION: ?Patient tolerated therapy well with no adverse effects. Patient arrived reporting increased lower back and right thigh pain this visit, she just arrives from work. Therapy focused on mobility and stretching to help reduce pain, followed by strengthening exercises to progress core stabilization and activity tolerance. Patient continues to demonstrate difficulty controlling excessive lumbar lordosis with exercises which does cause increased right lower back pain, so consistent cueing provided for lumbopelvic control. No changes to HEP this visit. Patient will benefit from continued skilled therapy to address pain and the listed deficits in order to achieve functional goals, enable safety and independence in completion of daily tasks, and return to PLOF. ? ?Patient presents with pain and  impairments/deficits in: pain-free lumbar ROM, hip and core strength.   ?  ?  ?GOALS: ?SHORT TERM GOALS: ?  ?STG Name Target Date Goal status  ?[place] will be >75% HEP compliant to improve carryover between sessions and facilitate independent management of condition ?  ?Baseline: patient reports consistency with exercises 01/24/2022 ACHIEVED  ?  ?LONG TERM GOALS:  ?  ?LTG Name Target Date Goal status  ?1 Rupa will improve FOTO score from  66 (baseline) to 74 as a proxy for functional improvement ? ?Baseline: 66 -> 76 02/28/2022 ACHIEVED  ?2 Janeya will report >/= 50% decrease in pain from evaluation  ?  ?Baseline: 4-5/10 max pain 02/28/2022 ONGOING  ?3 Alena will improve the following MMTs to >/= 4/5 to show improvement in strength:  hip ext and abduction  ?  ?Baseline: see chart in note 02/28/2022 INITIAL  ?[place] will be able to stand for >120 min at work, not limited by pain  ?  ?Baseline: 30 min 02/28/2022 INITIAL  ?  ? ?PLAN: ?PT FREQUENCY: 1-2x/week ?  ?PT DURATION: 8 weeks (Ending 02/28/2022) ?  ?PLANNED INTERVENTIONS: Therapeutic exercises, Therapeutic activity, Neuro Muscular re-education, Gait training, Patient/Family education, Joint mobilization, Dry Needling, Electrical stimulation, Spinal mobilization and/or manipulation, Moist heat, Taping, Vasopneumatic device, Ionotophoresis '4mg'$ /ml Dexamethasone, and Manual therapy ?  ?PLAN FOR NEXT SESSION: progressive lumbar and hip strengthening, TDN and manual PRN ? ? ?Hilda Blades, PT, DPT, LAT, ATC ?02/15/22  5:06 PM ?Phone: [phone] ?Fax: [phone] ? ? ? ? ?  ? ?

## 2022-02-15 ENCOUNTER — Other Ambulatory Visit: Payer: Self-pay

## 2022-02-15 ENCOUNTER — Encounter: Payer: Self-pay | Admitting: Physical Therapy

## 2022-02-15 ENCOUNTER — Ambulatory Visit: Payer: 59 | Attending: Orthopaedic Surgery | Admitting: Physical Therapy

## 2022-02-15 DIAGNOSIS — M542 Cervicalgia: Secondary | ICD-10-CM

## 2022-02-15 DIAGNOSIS — M6281 Muscle weakness (generalized): Secondary | ICD-10-CM

## 2022-02-15 DIAGNOSIS — M545 Low back pain, unspecified: Secondary | ICD-10-CM | POA: Diagnosis not present

## 2022-02-17 ENCOUNTER — Encounter: Payer: Self-pay | Admitting: Family Medicine

## 2022-02-17 ENCOUNTER — Other Ambulatory Visit (HOSPITAL_COMMUNITY): Payer: Self-pay

## 2022-02-17 ENCOUNTER — Ambulatory Visit: Payer: 59 | Admitting: Family Medicine

## 2022-02-17 VITALS — BP 110/80 | HR 67 | Temp 98.0°F | Ht 59.25 in | Wt 147.2 lb

## 2022-02-17 DIAGNOSIS — R5383 Other fatigue: Secondary | ICD-10-CM | POA: Diagnosis not present

## 2022-02-17 DIAGNOSIS — K219 Gastro-esophageal reflux disease without esophagitis: Secondary | ICD-10-CM

## 2022-02-17 DIAGNOSIS — M5441 Lumbago with sciatica, right side: Secondary | ICD-10-CM

## 2022-02-17 DIAGNOSIS — J309 Allergic rhinitis, unspecified: Secondary | ICD-10-CM | POA: Diagnosis not present

## 2022-02-17 DIAGNOSIS — R0982 Postnasal drip: Secondary | ICD-10-CM

## 2022-02-17 DIAGNOSIS — Z9109 Other allergy status, other than to drugs and biological substances: Secondary | ICD-10-CM

## 2022-02-17 DIAGNOSIS — I10 Essential (primary) hypertension: Secondary | ICD-10-CM | POA: Insufficient documentation

## 2022-02-17 LAB — CBC WITH DIFFERENTIAL/PLATELET
Basophils Absolute: 0 10*3/uL (ref 0.0–0.1)
Basophils Relative: 0.3 % (ref 0.0–3.0)
Eosinophils Absolute: 0.2 10*3/uL (ref 0.0–0.7)
Eosinophils Relative: 2.3 % (ref 0.0–5.0)
HCT: 36.8 % (ref 36.0–46.0)
Hemoglobin: 12.4 g/dL (ref 12.0–15.0)
Lymphocytes Relative: 23.8 % (ref 12.0–46.0)
Lymphs Abs: 2.1 10*3/uL (ref 0.7–4.0)
MCHC: 33.8 g/dL (ref 30.0–36.0)
MCV: 80.7 fl (ref 78.0–100.0)
Monocytes Absolute: 0.8 10*3/uL (ref 0.1–1.0)
Monocytes Relative: 9 % (ref 3.0–12.0)
Neutro Abs: 5.7 10*3/uL (ref 1.4–7.7)
Neutrophils Relative %: 64.6 % (ref 43.0–77.0)
Platelets: 324 10*3/uL (ref 150.0–400.0)
RBC: 4.56 Mil/uL (ref 3.87–5.11)
RDW: 14 % (ref 11.5–15.5)
WBC: 8.9 10*3/uL (ref 4.0–10.5)

## 2022-02-17 LAB — COMPREHENSIVE METABOLIC PANEL
ALT: 11 U/L (ref 0–35)
AST: 18 U/L (ref 0–37)
Albumin: 4.1 g/dL (ref 3.5–5.2)
Alkaline Phosphatase: 65 U/L (ref 39–117)
BUN: 13 mg/dL (ref 6–23)
CO2: 28 mEq/L (ref 19–32)
Calcium: 9.1 mg/dL (ref 8.4–10.5)
Chloride: 103 mEq/L (ref 96–112)
Creatinine, Ser: 0.85 mg/dL (ref 0.40–1.20)
GFR: 85.73 mL/min (ref >60.00)
Glucose, Bld: 76 mg/dL (ref 70–99)
Potassium: 3.9 mEq/L (ref 3.5–5.1)
Sodium: 136 mEq/L (ref 135–145)
Total Bilirubin: 0.3 mg/dL (ref 0.2–1.2)
Total Protein: 7.3 g/dL (ref 6.0–8.3)

## 2022-02-17 LAB — VITAMIN B12: Vitamin B-12: 782 pg/mL (ref 211–911)

## 2022-02-17 LAB — VITAMIN D 25 HYDROXY (VIT D DEFICIENCY, FRACTURES): VITD: 24.15 ng/mL — ABNORMAL LOW (ref 30.00–100.00)

## 2022-02-17 MED ORDER — MONTELUKAST SODIUM 10 MG PO TABS
10.0000 mg | ORAL_TABLET | Freq: Every day | ORAL | 1 refills | Status: DC
  Filled 2022-02-17: qty 90, 90d supply, fill #0

## 2022-02-17 MED ORDER — FLUTICASONE PROPIONATE 50 MCG/ACT NA SUSP
1.0000 | Freq: Every day | NASAL | 5 refills | Status: DC
  Filled 2022-02-17: qty 16, 60d supply, fill #0

## 2022-02-17 MED ORDER — ALBUTEROL SULFATE HFA 108 (90 BASE) MCG/ACT IN AERS
2.0000 | INHALATION_SPRAY | Freq: Four times a day (QID) | RESPIRATORY_TRACT | 2 refills | Status: DC | PRN
Start: 2022-02-17 — End: 2023-02-13
  Filled 2022-02-17: qty 18, 25d supply, fill #0
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
## 2022-02-20 NOTE — Therapy (Incomplete)
OUTPATIENT PHYSICAL THERAPY TREATMENT NOTE   Patient Name: [name] MRN: [id number] DOB:Nov 20, 1981, 41 y.o., female Today's Date: 02/20/2022  PCP: Victoria Macadam, MD REFERRING PROVIDER: Garald Balding, MD         Past Medical History:  Diagnosis Date   Hypertension    IBS (irritable bowel syndrome) 04/13/2014   Sickle cell trait ([place])    No past surgical history on file. Patient Active Problem List   Diagnosis Date Noted   Hypertension 02/17/2022   Lactose intolerance 06/27/2019   GERD (gastroesophageal reflux disease) 06/27/2019   Environmental allergies 11/15/2018   Bronchitis 11/15/2018   Wheezing 11/15/2018   Cough 11/15/2018   Chronic pain of right ankle 08/06/2017   Localized osteoarthritis of right ankle 08/06/2017   Syncope 11/25/2014   Leiomyoma of uterus 04/13/2014   Amenorrhea due to Depo Provera 04/13/2014    REFERRING PROVIDER: Garald Balding, MD   REFERRING DIAG: Acute bilateral low back pain with right-sided sciatica, Neck pain  THERAPY DIAG:  No diagnosis found.  PERTINENT HISTORY: None  PRECAUTIONS: None   SUBJECTIVE: Patient reports she is having more pain today and pain into the right leg. She reports that it could be from standing too much and the hard floors at work causing increased right sided low back pain and right thigh.   PAIN:  Are you having pain? Yes NPRS scale: 9/10 Pain location: Back, right leg Pain orientation: Lower PAIN TYPE: Chronic Pain description: intermittent, pressure Aggravating factors: standing and sitting for long periods (30 -> 45 min) Relieving factors: gentle movement  Patient Goals: make back feel like normal, reduce pain   OBJECTIVE: (BOLDED MEASURES ASSESSED THIS VISIT) GENERAL OBSERVATION:            Increased anterior pelvic tilt   LUMBAR AROM   AROM AROM  01/03/2022  01/24/2022  02/15/2022  Flexion WNL, w/ concordant pain WNL WNL  Extension WNL, w/ concordant pain  WNL - right sided lumbar tightness WNL - right sided lumbar tightness  Right lateral flexion WNL WNL WNL  Left lateral flexion WNL WNL WNL  Right rotation WNL WNL WNL  Left rotation WNL WNL WNL    LE MMT:   MMT Right 01/03/2022 Left 01/03/2022 Rt/ Lt 01/09/2022 Rt / Lt 01/17/2022 Rt / Lt 02/15/2022  Hip flexion (L2, L3)         Knee extension (L3)         Knee flexion         Hip abduction 3+ 4 3+ / 4  3+ / 4  Hip extension 3* 3*  3+ / 3+ 3+ / 3+  Hip external rotation         Hip internal rotation         Hip adduction         Ankle dorsiflexion (L4)         Ankle plantarflexion (S1)         Ankle inversion         Ankle eversion         Great Toe ext (L5)          (score listed is out of 5 possible points.  N = WNL, D = diminished, C = clear for gross weakness with myotome testing, * = concordant pain with testing)    PALPATION:  Tenderness of right lumbar paraspinals and QL with increased muscular tension compared to left   PATIENT SURVEYS:  FOTO  76% functional  status neck -01/30/2022 (66 initial - > 74 predicted)     TODAY'S TREATMENT: 02/22/2022: Therapeutic Exercise: NuStep L5 x 5 min with LE only, while taking subjective Thomas stretch with passive knee flexion 2 x 60 sec each Piriformis stretch 2 x 30 sec each LTR x 10 Figure-4 LTR x 6 each Child's pose stretch 2 x 20 sec forward, 2 x 20 sec each side Posterior pelvic tilt with belt and FR 10 x 5 sec hold Clamshell with green 2 x 15 each Modified side plank on knees 3 x 15 sec each Modified front plank on knees 3 x 20 sec Bird dog 2 x 10   02/15/2022: Therapeutic Exercise: NuStep L5 x 5 min with LE only, while taking subjective Thomas stretch with passive knee flexion 2 x 60 sec each Piriformis stretch 2 x 30 sec each LTR x 10 Figure-4 LTR x 6 each Child's pose stretch 2 x 20 sec forward, 2 x 20 sec each side Posterior pelvic tilt with belt and FR 10 x 5 sec hold Clamshell with green 2 x 15 each Modified  side plank on knees 3 x 15 sec each Modified front plank on knees 3 x 20 sec Bird dog 2 x 10  01/30/2022: Therapeutic Exercise: NuStep L6 x 5 min with LE only, while taking subjective LTR 10 x 5 sec each Thomas stretch with passive knee flexion 2 x 60 sec each Posterior pelvic tilt with belt and FR 10 x 5 sec hold Bridge 2 x 10 90-90 alternating heel tap 2 x 10 Sidelying hip abduction 2 x 10 each Bird dog 2 x 10 Modified front plank on knees 3 x 20 sec  PATIENT EDUCATION:  HEP. Pt educated via explanation, demonstration. Pt confirms understanding verbally.    HOME EXERCISE PROGRAM: Access Code: [email or web address]    ASSESSMENT: CLINICAL IMPRESSION: Patient tolerated therapy well with no adverse effects. *** Patient will benefit from continued skilled therapy to address pain and the listed deficits in order to achieve functional goals, enable safety and independence in completion of daily tasks, and return to PLOF.  Patient arrived reporting increased lower back and right thigh pain this visit, she just arrives from work. Therapy focused on mobility and stretching to help reduce pain, followed by strengthening exercises to progress core stabilization and activity tolerance. Patient continues to demonstrate difficulty controlling excessive lumbar lordosis with exercises which does cause increased right lower back pain, so consistent cueing provided for lumbopelvic control. No changes to HEP this visit.   Patient presents with pain and impairments/deficits in: pain-free lumbar ROM, hip and core strength.       GOALS: SHORT TERM GOALS:   STG Name Target Date Goal status  1 [name] will be >75% HEP compliant to improve carryover between sessions and facilitate independent management of condition   Baseline: patient reports consistency with exercises 01/24/2022 ACHIEVED    LONG TERM GOALS:    LTG Name Target Date Goal status  1 [name] will improve FOTO score from 66 (baseline) to 74 as  a proxy for functional improvement  Baseline: 66 -> 76 02/28/2022 ACHIEVED  2 [name] will report >/= 50% decrease in pain from evaluation    Baseline: 4-5/10 max pain 02/28/2022 ONGOING  3 [name] will improve the following MMTs to >/= 4/5 to show improvement in strength:  hip ext and abduction    Baseline: see chart in note 02/28/2022 INITIAL  4 Khaniyah will be able to stand for >120 min at work, not  limited by pain    Baseline: 30 min 02/28/2022 INITIAL     PLAN: PT FREQUENCY: 1-2x/week   PT DURATION: 8 weeks (Ending 02/28/2022)   PLANNED INTERVENTIONS: Therapeutic exercises, Therapeutic activity, Neuro Muscular re-education, Gait training, Patient/Family education, Joint mobilization, Dry Needling, Electrical stimulation, Spinal mobilization and/or manipulation, Moist heat, Taping, Vasopneumatic device, Ionotophoresis '4mg'$ /ml Dexamethasone, and Manual therapy   PLAN FOR NEXT SESSION: progressive lumbar and hip strengthening, TDN and manual PRN   Hilda Blades, PT, DPT, LAT, ATC 02/20/22  4:54 PM Phone: [phone] Fax: [phone]

## 2022-02-22 ENCOUNTER — Other Ambulatory Visit: Payer: Self-pay

## 2022-02-22 ENCOUNTER — Other Ambulatory Visit (HOSPITAL_COMMUNITY): Payer: Self-pay

## 2022-02-22 ENCOUNTER — Ambulatory Visit: Payer: 59 | Admitting: Physical Therapy

## 2022-02-22 ENCOUNTER — Encounter: Payer: Self-pay | Admitting: Physical Therapy

## 2022-02-22 DIAGNOSIS — M542 Cervicalgia: Secondary | ICD-10-CM

## 2022-02-22 DIAGNOSIS — M6281 Muscle weakness (generalized): Secondary | ICD-10-CM | POA: Diagnosis not present

## 2022-02-22 DIAGNOSIS — M545 Low back pain, unspecified: Secondary | ICD-10-CM | POA: Diagnosis not present

## 2022-02-22 MED ORDER — CHLORHEXIDINE GLUCONATE 0.12 % MT SOLN
OROMUCOSAL | 0 refills | Status: DC
  Filled 2022-02-22: qty 473, 16d supply, fill #0

## 2022-02-27 NOTE — Therapy (Signed)
?OUTPATIENT PHYSICAL THERAPY TREATMENT NOTE ? ? ?Patient Name: [name] ?MRN: [id number] ?DOB:[birth year], 41 y.o., female ?Today's Date: 03/01/2022 ? ?PCP: Caren Macadam, MD ?REFERRING PROVIDER: Garald Balding, MD ? ? PT End of Session - 03/01/22 1623   ? ? Visit Number 10   ? Number of Visits 16   ? Date for PT Re-Evaluation 04/12/22   ? Authorization Type UMR   ? PT Start Time 1615   ? PT Stop Time 1700   ? PT Time Calculation (min) 45 min   ? Activity Tolerance Patient tolerated treatment well   ? Behavior During Therapy [facility] for tasks assessed/performed   ? ?  ?  ? ?  ? ? ? ? ? ? ? ? ?Past Medical History:  ?Diagnosis Date  ? Hypertension   ? IBS (irritable bowel syndrome) 04/13/2014  ? Sickle cell trait ([place])   ? ?History reviewed. No pertinent surgical history. ?Patient Active Problem List  ? Diagnosis Date Noted  ? Hypertension 02/17/2022  ? Lactose intolerance 06/27/2019  ? GERD (gastroesophageal reflux disease) 06/27/2019  ? Environmental allergies 11/15/2018  ? Bronchitis 11/15/2018  ? Wheezing 11/15/2018  ? Cough 11/15/2018  ? Chronic pain of right ankle 08/06/2017  ? Localized osteoarthritis of right ankle 08/06/2017  ? Syncope 11/25/2014  ? Leiomyoma of uterus 04/13/2014  ? Amenorrhea due to Depo Provera 04/13/2014  ? ? ?REFERRING PROVIDER: Garald Balding, MD ?  ?REFERRING DIAG: Acute bilateral low back pain with right-sided sciatica, Neck pain ? ?THERAPY DIAG:  ?Low back pain without sciatica, unspecified back pain laterality, unspecified chronicity ? ?Muscle weakness ? ?Cervicalgia ? ?PERTINENT HISTORY: None ? ?PRECAUTIONS: None ? ? ?SUBJECTIVE: Patient reports she continues to have dizziness due to the weather. States her back has not been feeling that bad. States the leg, front of the thigh, has not been as bad as it was before but does hurt occasionally. ? ?PAIN:  ?Are you having pain? Yes ?NPRS scale: 4/10 ?Pain location: Back, right leg ?Pain orientation: Lower ?PAIN TYPE:  Chronic ?Pain description: intermittent, pressure ?Aggravating factors: standing and sitting for long periods (30 -> 45 min) ?Relieving factors: gentle movement ? ?Patient Goals: make back feel like normal, reduce pain ? ? ?OBJECTIVE: ?GENERAL OBSERVATION: ?           Increased anterior pelvic tilt ?  ?LUMBAR AROM ?  ?AROM AROM  ?01/03/2022  ?01/24/2022  ?02/15/2022  ?03/01/2022  ?Flexion WNL, w/ concordant pain WNL WNL WFL  ?Extension WNL, w/ concordant pain WNL - right sided lumbar tightness WNL - right sided lumbar tightness WFL  ?Right lateral flexion WNL WNL WNL WFL  ?Left lateral flexion WNL WNL WNL WFL  ?Right rotation WNL WNL WNL WFL  ?Left rotation WNL WNL WNL WFL  ?  ?LE MMT: ?  ?MMT Right ?01/03/2022 Left ?01/03/2022 Rt/ Lt ?01/09/2022 Rt / Lt ?01/17/2022 Rt / Lt ?02/15/2022 Rt / Lt ?03/01/2022  ?Hip flexion (L2, L3)        4 / 4  ?Knee extension (L3)        5 / 5  ?Knee flexion        5 / 5  ?Hip abduction 3+ 4 3+ / 4  3+ / 4 4 / 4  ?Hip extension 3* 3*  3+ / 3+ 3+ / 3+ 4 / 4-  ?Hip external rotation          ?Hip internal rotation          ?  Hip adduction          ?Ankle dorsiflexion (L4)          ?Ankle plantarflexion (S1)          ?Ankle inversion          ?Ankle eversion          ?Great Toe ext (L5)          ? (score listed is out of 5 possible points.  N = WNL, D = diminished, C = clear for gross weakness with myotome testing, * = concordant pain with testing) ?  ? PALPATION:  ?Tenderness of right lumbar paraspinals and QL with increased muscular tension compared to left ?  ?PATIENT SURVEYS:  ?FOTO  76% functional status neck - 01/30/2022 (66 initial - > 74 predicted) ?  ?  ?TODAY'S TREATMENT: ?03/01/2022: ?Therapeutic Exercise: ?NuStep L6 x 5 min with LE only, while taking subjective ?Bridge with focus on segmental lift 2 x 10 with 5 sec hold ?Posterior pelvic tilt with SLR 2 x 10 each ?Posterior pelvic tilt with clamshell with blue 2 x 10 ?Bird dog 2 x 10 ? ? ?02/22/2022: ?Therapeutic Exercise: ?NuStep L6 x 5 min  with LE only, while taking subjective ?Piriformis stretch 2 x 30 sec each ?Figure-4 LTR x 6 each ?Thomas stretch with passive knee flexion 2 x 60 sec each ?Posterior pelvic tilt 10 x 5 sec ?Posterior pelvic tilt with SLR 2 x 5 each ?Bridge with focus on segmental lift 2 x 5 ?Posterior pelvic tilt with clamshell with blue 2 x 10 ?Hooklying physioball squeeze with alternating leg extension x 10 ?Child's pose x 30 sec forward, x 30 sec each side ? ?02/15/2022: ?Therapeutic Exercise: ?NuStep L5 x 5 min with LE only, while taking subjective ?Thomas stretch with passive knee flexion 2 x 60 sec each ?Piriformis stretch 2 x 30 sec each ?LTR x 10 ?Figure-4 LTR x 6 each ?Child's pose stretch 2 x 20 sec forward, 2 x 20 sec each side ?Posterior pelvic tilt with belt and FR 10 x 5 sec hold ?Clamshell with green 2 x 15 each ?Modified side plank on knees 3 x 15 sec each ?Modified front plank on knees 3 x 20 sec ?Bird dog 2 x 10 ? ?PATIENT EDUCATION:  ?POC extension, progress toward goals, HEP. Pt educated via explanation, demonstration. Pt confirms understanding verbally.  ?  ?HOME EXERCISE PROGRAM: ?Access Code: [email or web address] ? ?  ?ASSESSMENT: ?CLINICAL IMPRESSION: ?Patient tolerated therapy well with no adverse effects. She has made progress with therapy, demonstrating an improvement in her pain free motion, hip and core strength, and overall functional ability. All her range of motion and strength testing did not cause an increase in concordant pain. She does see to be slightly limited due to her vertigo symptoms so did not progress her exercises in therapy. Therapy continues to focus on progressing lumbopelvic control through core stabilization and hip strengthening. She does continue to require frequent cueing for core activation for spinal control to avoid excessive lumbar lordosis. Patient continues to exhibit deficits with her standing tolerance and strength so will extend her PT POC for 6 more weeks at frequency of 1x/week.  Patient will benefit from continued skilled therapy to address pain and the listed deficits in order to achieve functional goals, enable safety and independence in completion of daily tasks, and return to PLOF. ? ?Patient presents with pain and impairments/deficits in: pain-free lumbar ROM, hip and core strength.   ?  ?  ?  GOALS: ?SHORT TERM GOALS: ?  ?STG Name Target Date Goal status  ?[place] will be >75% HEP compliant to improve carryover between sessions and facilitate independent management of condition ?  ?Baseline: patient reports consistency with exercises 01/24/2022 ACHIEVED  ?  ?LONG TERM GOALS:  ?  ?LTG Name Target Date Goal status  ?1 Doreene will improve FOTO score from 66 (baseline) to 74 as a proxy for functional improvement ? ?Baseline: 66 -> 76 ?01/30/2022: 76% 04/12/2022 MET  ?2 Liara will report >/= 50% decrease in pain from evaluation  ?  ?Baseline: 4-5/10 max pain ?03/01/2022: 4/10 pain 04/12/2022 ONGOING  ?3 Rosilyn will improve the following MMTs to >/= 4/5 to show improvement in strength:  hip ext and abduction  ?  ?Baseline: see chart in note ?03/01/2022: patient continues to exhibit hip strength deficits 04/12/2022 ONGOING  ?4 Stephanye will be able to stand for >120 min at work, not limited by pain  ?  ?Baseline: 30 min ?03/01/2022: patient states she can stand all day at work but her back hurts so she feels limited at work 04/12/2022 ONGOING  ?  ? ?PLAN: ?PT FREQUENCY: 1x/week ?  ?PT DURATION: 6 weeks ?  ?PLANNED INTERVENTIONS: Therapeutic exercises, Therapeutic activity, Neuro Muscular re-education, Gait training, Patient/Family education, Joint mobilization, Dry Needling, Electrical stimulation, Spinal mobilization and/or manipulation, Moist heat, Taping, Vasopneumatic device, Ionotophoresis 79m/ml Dexamethasone, and Manual therapy ?  ?PLAN FOR NEXT SESSION: Review HEP and progress PRN, manual/dry needling for lumbar as patient tolerates, progressive lumbar and hip  strengthening ? ? ?CHilda Blades PT, DPT, LAT, ATC ?03/01/22  5:00 PM ?Phone: [phone]?Fax: [phone]? ? ? ? ?  ? ?

## 2022-03-01 ENCOUNTER — Other Ambulatory Visit: Payer: Self-pay

## 2022-03-01 ENCOUNTER — Encounter: Payer: Self-pay | Admitting: Physical Therapy

## 2022-03-01 ENCOUNTER — Ambulatory Visit: Payer: 59 | Admitting: Physical Therapy

## 2022-03-01 DIAGNOSIS — M6281 Muscle weakness (generalized): Secondary | ICD-10-CM | POA: Diagnosis not present

## 2022-03-01 DIAGNOSIS — M542 Cervicalgia: Secondary | ICD-10-CM | POA: Diagnosis not present

## 2022-03-01 DIAGNOSIS — M545 Low back pain, unspecified: Secondary | ICD-10-CM

## 2022-03-01 NOTE — Patient Instructions (Signed)
?  Access Code: [email or web address] ?URL: [email or web address] ?Date: 03/01/2022 ?Prepared by: Hilda Blades ? ?Exercises ?- Supine Lower Trunk Rotation  - 1-2 x daily - 20 reps - 3 seconds hold ?- Supine Posterior Pelvic Tilt  - 1-2 x daily - 2 sets - 10 reps - 5' seconds hold ?- Bridge  - 1-2 x daily - 2 sets - 10 reps - 5 seconds hold ?- Supine Pelvic Tilt with Straight Leg Raise  - 1-2 x daily - 3 sets - 10 reps ?- Hooklying Clamshell with Resistance  - 1-2 x daily - 2 sets - 10 reps ?- Bird Dog  - 1-2 x daily - 2 sets - 10 reps - 3 seconds hold ? ?

## 2022-03-03 ENCOUNTER — Other Ambulatory Visit: Payer: Self-pay

## 2022-03-03 ENCOUNTER — Encounter: Payer: Self-pay | Admitting: Obstetrics & Gynecology

## 2022-03-03 ENCOUNTER — Ambulatory Visit: Payer: 59 | Admitting: Obstetrics & Gynecology

## 2022-03-03 ENCOUNTER — Other Ambulatory Visit (HOSPITAL_COMMUNITY)
Admission: RE | Admit: 2022-03-03 | Discharge: 2022-03-03 | Disposition: A | Discharge status: Home / Self Care | Payer: 59 | Source: Ambulatory Visit | Attending: Obstetrics & Gynecology | Admitting: Obstetrics & Gynecology

## 2022-03-03 VITALS — BP 110/80

## 2022-03-03 DIAGNOSIS — N870 Mild cervical dysplasia: Secondary | ICD-10-CM | POA: Diagnosis not present

## 2022-03-03 DIAGNOSIS — R102 Pelvic and perineal pain: Secondary | ICD-10-CM | POA: Diagnosis not present

## 2022-03-03 LAB — URINALYSIS, COMPLETE W/RFL CULTURE
Bacteria, UA: NONE SEEN /HPF
Bilirubin Urine: NEGATIVE
Glucose, UA: NEGATIVE
Hgb urine dipstick: NEGATIVE
Hyaline Cast: NONE SEEN /LPF
Ketones, ur: NEGATIVE
Leukocyte Esterase: NEGATIVE
Nitrites, Initial: NEGATIVE
Protein, ur: NEGATIVE
RBC / HPF: NONE SEEN /HPF (ref 0–2)
Specific Gravity, Urine: 1.02 (ref 1.001–1.035)
WBC, UA: NONE SEEN /HPF (ref 0–5)
pH: 5.5 (ref 5.0–8.0)

## 2022-03-03 LAB — NO CULTURE INDICATED
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
## 2022-03-07 LAB — CYTOLOGY - PAP
Diagnosis: NEGATIVE
Diagnosis: REACTIVE

## 2022-03-08 ENCOUNTER — Ambulatory Visit: Payer: 59 | Admitting: Physical Therapy

## 2022-03-08 ENCOUNTER — Encounter: Payer: Self-pay | Admitting: Physical Therapy

## 2022-03-08 DIAGNOSIS — M542 Cervicalgia: Secondary | ICD-10-CM

## 2022-03-08 DIAGNOSIS — M6281 Muscle weakness (generalized): Secondary | ICD-10-CM | POA: Diagnosis not present

## 2022-03-08 DIAGNOSIS — M545 Low back pain, unspecified: Secondary | ICD-10-CM | POA: Diagnosis not present

## 2022-03-08 NOTE — Therapy (Signed)
?OUTPATIENT PHYSICAL THERAPY TREATMENT NOTE ? ? ?Patient Name: [name] ?MRN: [id number] ?DOB:05/26/1981, 41 y.o., female ?Today's Date: 03/08/2022 ? ?PCP: Caren Macadam, MD ?REFERRING PROVIDER: Garald Balding, MD ? ? PT End of Session - 03/08/22 1614   ? ? Visit Number 11   ? Number of Visits 16   ? Date for PT Re-Evaluation 04/12/22   ? Authorization Type UMR   ? PT Start Time 1615   ? PT Stop Time 2542   ? PT Time Calculation (min) 43 min   ? Activity Tolerance Patient tolerated treatment well   ? Behavior During Therapy [facility] for tasks assessed/performed   ? ?  ?  ? ?  ? ? ? ? ? ? ? ? ?Past Medical History:  ?Diagnosis Date  ? Hypertension   ? IBS (irritable bowel syndrome) 04/13/2014  ? Sickle cell trait ([place])   ? Vertigo   ? ?History reviewed. No pertinent surgical history. ?Patient Active Problem List  ? Diagnosis Date Noted  ? Hypertension 02/17/2022  ? Lactose intolerance 06/27/2019  ? GERD (gastroesophageal reflux disease) 06/27/2019  ? Environmental allergies 11/15/2018  ? Bronchitis 11/15/2018  ? Wheezing 11/15/2018  ? Cough 11/15/2018  ? Chronic pain of right ankle 08/06/2017  ? Localized osteoarthritis of right ankle 08/06/2017  ? Syncope 11/25/2014  ? Leiomyoma of uterus 04/13/2014  ? Amenorrhea due to Depo Provera 04/13/2014  ? ? ?REFERRING PROVIDER: Garald Balding, MD ?  ?REFERRING DIAG: Acute bilateral low back pain with right-sided sciatica, Neck pain ? ?THERAPY DIAG:  ?Low back pain without sciatica, unspecified back pain laterality, unspecified chronicity ? ?Muscle weakness ? ?Cervicalgia ? ?PERTINENT HISTORY: None ? ?PRECAUTIONS: None ? ? ?SUBJECTIVE:  ?Pt reports that she had to do quite a bit of pushing and lifting that she does not usually have to do at work. ? ?PAIN:  ?Are you having pain? Yes ?NPRS scale: 3-4/10 ?Pain location: Back, right leg ?Pain orientation: Lower ?PAIN TYPE: Chronic ?Pain description: intermittent, pressure ?Aggravating factors: standing and  sitting for long periods (30 -> 45 min) ?Relieving factors: gentle movement ? ?Patient Goals: make back feel like normal, reduce pain ? ? ?OBJECTIVE: ?GENERAL OBSERVATION: ?           Increased anterior pelvic tilt ?  ?LUMBAR AROM ?  ?AROM AROM  ?01/03/2022  ?01/24/2022  ?02/15/2022  ?03/01/2022  ?Flexion WNL, w/ concordant pain WNL WNL WFL  ?Extension WNL, w/ concordant pain WNL - right sided lumbar tightness WNL - right sided lumbar tightness WFL  ?Right lateral flexion WNL WNL WNL WFL  ?Left lateral flexion WNL WNL WNL WFL  ?Right rotation WNL WNL WNL WFL  ?Left rotation WNL WNL WNL WFL  ?  ?LE MMT: ?  ?MMT Right ?01/03/2022 Left ?01/03/2022 Rt/ Lt ?01/09/2022 Rt / Lt ?01/17/2022 Rt / Lt ?02/15/2022 Rt / Lt ?03/01/2022  ?Hip flexion (L2, L3)        4 / 4  ?Knee extension (L3)        5 / 5  ?Knee flexion        5 / 5  ?Hip abduction 3+ 4 3+ / 4  3+ / 4 4 / 4  ?Hip extension 3* 3*  3+ / 3+ 3+ / 3+ 4 / 4-  ?Hip external rotation          ?Hip internal rotation          ?Hip adduction          ?  Ankle dorsiflexion (L4)          ?Ankle plantarflexion (S1)          ?Ankle inversion          ?Ankle eversion          ?Great Toe ext (L5)          ? (score listed is out of 5 possible points.  N = WNL, D = diminished, C = clear for gross weakness with myotome testing, * = concordant pain with testing) ?  ? PALPATION:  ?Tenderness of right lumbar paraspinals and QL with increased muscular tension compared to left ?  ?PATIENT SURVEYS:  ?FOTO  76% functional status neck - 01/30/2022 (66 initial - > 74 predicted) ?  ?  ?TODAY'S TREATMENT: ? ?03/08/2022: ?Therapeutic Exercise: ?NuStep L7 x 5 min with LE only, while taking subjective ?Thomas stretch with passive knee flexion 2 x 60 sec each (NT) ?LTR x 10 ?Staggered bridge - 2x10 ea ?Child's pose stretch 2 x 20 sec forward, 2 x 20 sec each side ?Posterior pelvic tilt 20x ?Clamshell with blue 2 x 15 each ?Modified side plank on knees 3 x 20 sec each ?Modified front plank on knees 3 x 30 sec ?Bird  dog 2 x 10 ? ?Manual therapy: ?Skilled palpation for manual therapy ?STM lumbar paraspinals ? ?Trigger Point Dry-Needling  ?Treatment instructions: Expect mild to moderate muscle soreness. S/S of pneumothorax if dry needled over a lung field, and to seek immediate medical attention should they occur. Patient verbalized understanding of these instructions and education. ? ?Patient Consent Given: Yes ?Education handout provided: No ?Muscles treated: lumbar multifidus ?Electrical stimulation performed: No ?Parameters: N/A ?Treatment response/outcome: pain relief ? ? ?03/01/2022: ?Therapeutic Exercise: ?NuStep L6 x 5 min with LE only, while taking subjective ?Bridge with focus on segmental lift 2 x 10 with 5 sec hold ?Posterior pelvic tilt with SLR 2 x 10 each ?Posterior pelvic tilt with clamshell with blue 2 x 10 ?Bird dog 2 x 10 ? ? ?02/22/2022: ?Therapeutic Exercise: ?NuStep L6 x 5 min with LE only, while taking subjective ?Piriformis stretch 2 x 30 sec each ?Figure-4 LTR x 6 each ?Thomas stretch with passive knee flexion 2 x 60 sec each ?Posterior pelvic tilt 10 x 5 sec ?Posterior pelvic tilt with SLR 2 x 5 each ?Bridge with focus on segmental lift 2 x 5 ?Posterior pelvic tilt with clamshell with blue 2 x 10 ?Hooklying physioball squeeze with alternating leg extension x 10 ?Child's pose x 30 sec forward, x 30 sec each side ? ?02/15/2022: ?Therapeutic Exercise: ?NuStep L5 x 5 min with LE only, while taking subjective ?Thomas stretch with passive knee flexion 2 x 60 sec each ?Piriformis stretch 2 x 30 sec each ?LTR x 10 ?Figure-4 LTR x 6 each ?Child's pose stretch 2 x 20 sec forward, 2 x 20 sec each side ?Posterior pelvic tilt with belt and FR 10 x 5 sec hold ?Clamshell with green 2 x 15 each ?Modified side plank on knees 3 x 15 sec each ?Modified front plank on knees 3 x 20 sec ?Bird dog 2 x 10 ? ?PATIENT EDUCATION:  ?POC extension, progress toward goals, HEP. Pt educated via explanation, demonstration. Pt confirms  understanding verbally.  ?  ?HOME EXERCISE PROGRAM: ?Access Code: [email or web address] ? ?  ?ASSESSMENT: ?CLINICAL IMPRESSION: ?Ritha did well with therapy.  No adverse events.  She fatigues with mat exercises and has slightly lower baseline pain today.  We trialed TDN for  lumbar multifidus and will monitor response.  Pt noted mild reduced pain in session. ? ?Patient presents with pain and impairments/deficits in: pain-free lumbar ROM, hip and core strength.   ?  ?  ?GOALS: ?SHORT TERM GOALS: ?  ?STG Name Target Date Goal status  ?[place] will be >75% HEP compliant to improve carryover between sessions and facilitate independent management of condition ?  ?Baseline: patient reports consistency with exercises 01/24/2022 ACHIEVED  ?  ?LONG TERM GOALS:  ?  ?LTG Name Target Date Goal status  ?1 Jac[place] will improve FOTO score from 66 (baseline) to 74 as a proxy for functional improvement ? ?Baseline: 66 -> 76 ?01/30/2022: 76% 04/12/2022 MET  ?2 Kirstein will report >/= 50% decrease in pain from evaluation  ?  ?Baseline: 4-5/10 max pain ?03/01/2022: 4/10 pain 04/12/2022 ONGOING  ?3 Martha will improve the following MMTs to >/= 4/5 to show improvement in strength:  hip ext and abduction  ?  ?Baseline: see chart in note ?03/01/2022: patient continues to exhibit hip strength deficits 04/12/2022 ONGOING  ?4 Verdis will be able to stand for >120 min at work, not limited by pain  ?  ?Baseline: 30 min ?03/01/2022: patient states she can stand all day at work but her back hurts so she feels limited at work 04/12/2022 ONGOING  ?  ? ?PLAN: ?PT FREQUENCY: 1x/week ?  ?PT DURATION: 6 weeks ?  ?PLANNED INTERVENTIONS: Therapeutic exercises, Therapeutic activity, Neuro Muscular re-education, Gait training, Patient/Family education, Joint mobilization, Dry Needling, Electrical stimulation, Spinal mobilization and/or manipulation, Moist heat, Taping, Vasopneumatic device, Ionotophoresis 16m/ml Dexamethasone, and Manual therapy ?  ?PLAN FOR NEXT  SESSION: Review HEP and progress PRN, manual/dry needling for lumbar as patient tolerates, progressive lumbar and hip strengthening ? ? ?KMathis DadPT ?03/08/22  5:00 PM ?Phone: [phone]?Fax: [phone]

## 2022-03-14 ENCOUNTER — Ambulatory Visit: Payer: 59 | Attending: Orthopaedic Surgery | Admitting: Physical Therapy

## 2022-03-14 ENCOUNTER — Encounter: Payer: Self-pay | Admitting: Physical Therapy

## 2022-03-14 DIAGNOSIS — M6281 Muscle weakness (generalized): Secondary | ICD-10-CM | POA: Diagnosis not present

## 2022-03-14 DIAGNOSIS — M542 Cervicalgia: Secondary | ICD-10-CM | POA: Insufficient documentation

## 2022-03-14 DIAGNOSIS — M545 Low back pain, unspecified: Secondary | ICD-10-CM | POA: Diagnosis not present

## 2022-03-14 NOTE — Therapy (Signed)
?OUTPATIENT PHYSICAL THERAPY TREATMENT NOTE ? ? ?Patient Name: [name] ?MRN: [id number] ?DOB:[birth year], 41 y.o., female ?Today's Date: 03/14/2022 ? ?PCP: Caren Macadam, MD ?REFERRING PROVIDER: Caren Macadam, MD ? ? PT End of Session - 03/14/22 1613   ? ? Visit Number 12   ? Number of Visits 16   ? Date for PT Re-Evaluation 04/12/22   ? Authorization Type UMR   ? PT Start Time 1615   ? PT Stop Time 1657   ? PT Time Calculation (min) 42 min   ? Activity Tolerance Patient tolerated treatment well   ? Behavior During Therapy [facility] for tasks assessed/performed   ? ?  ?  ? ?  ? ? ? ? ? ? ? ? ?Past Medical History:  ?Diagnosis Date  ? Hypertension   ? IBS (irritable bowel syndrome) 04/13/2014  ? Sickle cell trait ([place])   ? Vertigo   ? ?History reviewed. No pertinent surgical history. ?Patient Active Problem List  ? Diagnosis Date Noted  ? Hypertension 02/17/2022  ? Lactose intolerance 06/27/2019  ? GERD (gastroesophageal reflux disease) 06/27/2019  ? Environmental allergies 11/15/2018  ? Bronchitis 11/15/2018  ? Wheezing 11/15/2018  ? Cough 11/15/2018  ? Chronic pain of right ankle 08/06/2017  ? Localized osteoarthritis of right ankle 08/06/2017  ? Syncope 11/25/2014  ? Leiomyoma of uterus 04/13/2014  ? Amenorrhea due to Depo Provera 04/13/2014  ? ? ?REFERRING PROVIDER: Garald Balding, MD ?  ?REFERRING DIAG: Acute bilateral low back pain with right-sided sciatica, Neck pain ? ?THERAPY DIAG:  ?Low back pain without sciatica, unspecified back pain laterality, unspecified chronicity ? ?Muscle weakness ? ?Cervicalgia ? ?PERTINENT HISTORY: None ? ?PRECAUTIONS: None ? ? ?SUBJECTIVE:  ? ?Pt reports that her back is slowly improving, but can still be painful if she standing for long periods ? ?PAIN:  ?Are you having pain? Yes ?NPRS scale: 4/10 ?Pain location: Back, right leg ?Pain orientation: Lower ?PAIN TYPE: Chronic ?Pain description: intermittent, pressure ?Aggravating factors: standing and sitting  for long periods (30 -> 45 min) ?Relieving factors: gentle movement ? ?Patient Goals: make back feel like normal, reduce pain ? ? ?OBJECTIVE: ?GENERAL OBSERVATION: ?           Increased anterior pelvic tilt ?  ?LUMBAR AROM ?  ?AROM AROM  ?01/03/2022  ?01/24/2022  ?02/15/2022  ?03/01/2022  ?Flexion WNL, w/ concordant pain WNL WNL WFL  ?Extension WNL, w/ concordant pain WNL - right sided lumbar tightness WNL - right sided lumbar tightness WFL  ?Right lateral flexion WNL WNL WNL WFL  ?Left lateral flexion WNL WNL WNL WFL  ?Right rotation WNL WNL WNL WFL  ?Left rotation WNL WNL WNL WFL  ?  ?LE MMT: ?  ?MMT Right ?01/03/2022 Left ?01/03/2022 Rt/ Lt ?01/09/2022 Rt / Lt ?01/17/2022 Rt / Lt ?02/15/2022 Rt / Lt ?03/01/2022  ?Hip flexion (L2, L3)        4 / 4  ?Knee extension (L3)        5 / 5  ?Knee flexion        5 / 5  ?Hip abduction 3+ 4 3+ / 4  3+ / 4 4 / 4  ?Hip extension 3* 3*  3+ / 3+ 3+ / 3+ 4 / 4-  ?Hip external rotation          ?Hip internal rotation          ?Hip adduction          ?Ankle dorsiflexion (  L4)          ?Ankle plantarflexion (S1)          ?Ankle inversion          ?Ankle eversion          ?Great Toe ext (L5)          ? (score listed is out of 5 possible points.  N = WNL, D = diminished, C = clear for gross weakness with myotome testing, * = concordant pain with testing) ?  ? PALPATION:  ?Tenderness of right lumbar paraspinals and QL with increased muscular tension compared to left ?  ?PATIENT SURVEYS:  ?FOTO  76% functional status neck - 01/30/2022 (66 initial - > 74 predicted) ?  ?  ?TODAY'S TREATMENT: ? ?03/14/2022: ?Therapeutic Exercise: ?NuStep L7 x 5 min with LE only, while taking subjective ?Thomas stretch with passive knee flexion 2 x 60 sec each (NT) ?LTR x 10 ?Lumbar locked bridge - 2x10 ea ?Child's pose stretch 2 x 20 sec forward, 2 x 20 sec each side ?Posterior pelvic tilt 20x ?Modified side plank on knees with blue clam - 3 x 10 ?Modified front plank on knees 3 x 40 sec ?Bird dog 3 x 10 ?Seated lumbar ext  and D/L (next visit) ? ?03/08/2022: ?Therapeutic Exercise: ?NuStep L7 x 5 min with LE only, while taking subjective ?Thomas stretch with passive knee flexion 2 x 60 sec each (NT) ?LTR x 10 ?Staggered bridge - 2x10 ea ?Child's pose stretch 2 x 20 sec forward, 2 x 20 sec each side ?Posterior pelvic tilt 20x ?Clamshell with blue 2 x 15 each ?Modified side plank on knees 3 x 20 sec each ?Modified front plank on knees 3 x 30 sec ?Bird dog 2 x 10 ? ?Manual therapy: ?Skilled palpation for manual therapy ?STM lumbar paraspinals ? ?Trigger Point Dry-Needling  ?Treatment instructions: Expect mild to moderate muscle soreness. S/S of pneumothorax if dry needled over a lung field, and to seek immediate medical attention should they occur. Patient verbalized understanding of these instructions and education. ? ?Patient Consent Given: Yes ?Education handout provided: No ?Muscles treated: lumbar multifidus ?Electrical stimulation performed: No ?Parameters: N/A ?Treatment response/outcome: pain relief ? ? ?03/01/2022: ?Therapeutic Exercise: ?NuStep L6 x 5 min with LE only, while taking subjective ?Bridge with focus on segmental lift 2 x 10 with 5 sec hold ?Posterior pelvic tilt with SLR 2 x 10 each ?Posterior pelvic tilt with clamshell with blue 2 x 10 ?Bird dog 2 x 10 ? ? ?02/22/2022: ?Therapeutic Exercise: ?NuStep L6 x 5 min with LE only, while taking subjective ?Piriformis stretch 2 x 30 sec each ?Figure-4 LTR x 6 each ?Thomas stretch with passive knee flexion 2 x 60 sec each ?Posterior pelvic tilt 10 x 5 sec ?Posterior pelvic tilt with SLR 2 x 5 each ?Bridge with focus on segmental lift 2 x 5 ?Posterior pelvic tilt with clamshell with blue 2 x 10 ?Hooklying physioball squeeze with alternating leg extension x 10 ?Child's pose x 30 sec forward, x 30 sec each side ? ?02/15/2022: ?Therapeutic Exercise: ?NuStep L5 x 5 min with LE only, while taking subjective ?Thomas stretch with passive knee flexion 2 x 60 sec each ?Piriformis stretch 2  x 30 sec each ?LTR x 10 ?Figure-4 LTR x 6 each ?Child's pose stretch 2 x 20 sec forward, 2 x 20 sec each side ?Posterior pelvic tilt with belt and FR 10 x 5 sec hold ?Clamshell with green 2 x 15 each ?  Modified side plank on knees 3 x 15 sec each ?Modified front plank on knees 3 x 20 sec ?Bird dog 2 x 10 ? ?PATIENT EDUCATION:  ?POC extension, progress toward goals, HEP. Pt educated via explanation, demonstration. Pt confirms understanding verbally.  ?  ?HOME EXERCISE PROGRAM: ?Access Code: [email or web address] ? ?  ?ASSESSMENT: ?CLINICAL IMPRESSION: ?[name] continues to progress strength and endurance with mat exercises.  We will transition to more functional lifting activities as tolerated in the remaining visits.  Self reported 50% improvement since starting therapy.  Continue per POC. ? ?Patient presents with pain and impairments/deficits in: pain-free lumbar ROM, hip and core strength.   ?  ?  ?GOALS: ?SHORT TERM GOALS: ?  ?STG Name Target Date Goal status  ?[place] will be >75% HEP compliant to improve carryover between sessions and facilitate independent management of condition ?  ?Baseline: patient reports consistency with exercises 01/24/2022 ACHIEVED  ?  ?LONG TERM GOALS:  ?  ?LTG Name Target Date Goal status  ?1 [name] will improve FOTO score from 66 (baseline) to 74 as a proxy for functional improvement ? ?Baseline: 66 -> 76 ?01/30/2022: 76% 04/12/2022 MET  ?2 [name] will report >/= 50% decrease in pain from evaluation  ?  ?Baseline: 4-5/10 max pain ?03/01/2022: 4/10 pain ?4/4: 50% 04/12/2022 MET  ?3 [name] will improve the following MMTs to >/= 4/5 to show improvement in strength:  hip ext and abduction  ?  ?Baseline: see chart in note ?03/01/2022: patient continues to exhibit hip strength deficits 04/12/2022 ONGOING  ?4 [name] will be able to stand for >120 min at work, not limited by pain  ?  ?Baseline: 30 min ?03/01/2022: patient states she can stand all day at work but her back hurts so she feels limited  at work 04/12/2022 ONGOING  ?  ? ?PLAN: ?PT FREQUENCY: 1x/week ?  ?PT DURATION: 6 weeks ?  ?PLANNED INTERVENTIONS: Therapeutic exercises, Therapeutic activity, Neuro Muscular re-education, Gait training

## 2022-03-21 ENCOUNTER — Ambulatory Visit: Payer: 59 | Admitting: Physical Therapy

## 2022-03-21 ENCOUNTER — Other Ambulatory Visit: Payer: Self-pay

## 2022-03-21 ENCOUNTER — Other Ambulatory Visit (HOSPITAL_COMMUNITY): Payer: Self-pay

## 2022-03-21 ENCOUNTER — Encounter: Payer: Self-pay | Admitting: Physical Therapy

## 2022-03-21 DIAGNOSIS — M545 Low back pain, unspecified: Secondary | ICD-10-CM | POA: Diagnosis not present

## 2022-03-21 DIAGNOSIS — M6281 Muscle weakness (generalized): Secondary | ICD-10-CM | POA: Diagnosis not present

## 2022-03-21 DIAGNOSIS — M542 Cervicalgia: Secondary | ICD-10-CM | POA: Diagnosis not present

## 2022-03-21 DIAGNOSIS — B9689 Other specified bacterial agents as the cause of diseases classified elsewhere: Secondary | ICD-10-CM

## 2022-03-21 MED ORDER — TINIDAZOLE 500 MG PO TABS
ORAL_TABLET | ORAL | 0 refills | Status: DC
  Filled 2022-03-21: qty 8, 2d supply, fill #0

## 2022-03-21 NOTE — Therapy (Signed)
?OUTPATIENT PHYSICAL THERAPY TREATMENT NOTE ? ? ?Patient Name: [name] ?MRN: [id number] ?DOB:06-Jul-1981, 41 y.o., female ?Today's Date: 03/21/2022 ? ?PCP: Caren Macadam, MD ?REFERRING PROVIDER: Caren Macadam, MD ? ? PT End of Session - 03/21/22 1613   ? ? Visit Number 13   ? Number of Visits 16   ? Date for PT Re-Evaluation 04/12/22   ? Authorization Type UMR   ? PT Start Time [phone]   ? PT Stop Time 1245   ? PT Time Calculation (min) 43 min   ? Activity Tolerance Patient tolerated treatment well   ? Behavior During Therapy [facility] for tasks assessed/performed   ? ?  ?  ? ?  ? ? ? ? ? ? ? ? ?Past Medical History:  ?Diagnosis Date  ? Hypertension   ? IBS (irritable bowel syndrome) 04/13/2014  ? Sickle cell trait ([place])   ? Vertigo   ? ?History reviewed. No pertinent surgical history. ?Patient Active Problem List  ? Diagnosis Date Noted  ? Hypertension 02/17/2022  ? Lactose intolerance 06/27/2019  ? GERD (gastroesophageal reflux disease) 06/27/2019  ? Environmental allergies 11/15/2018  ? Bronchitis 11/15/2018  ? Wheezing 11/15/2018  ? Cough 11/15/2018  ? Chronic pain of right ankle 08/06/2017  ? Localized osteoarthritis of right ankle 08/06/2017  ? Syncope 11/25/2014  ? Leiomyoma of uterus 04/13/2014  ? Amenorrhea due to Depo Provera 04/13/2014  ? ? ?REFERRING PROVIDER: Garald Balding, MD ?  ?REFERRING DIAG: Acute bilateral low back pain with right-sided sciatica, Neck pain ? ?THERAPY DIAG:  ?Low back pain without sciatica, unspecified back pain laterality, unspecified chronicity ? ?Muscle weakness ? ?Cervicalgia ? ?PERTINENT HISTORY: None ? ?PRECAUTIONS: None ? ? ?SUBJECTIVE:  ? ?Pt reports that she was able to rest over the weekend and that her pain is ok today. ? ?PAIN:  ?Are you having pain? Yes ?NPRS scale: 3-4/10 ?Pain location: Back, right leg ?Pain orientation: Lower ?PAIN TYPE: Chronic ?Pain description: intermittent, pressure ?Aggravating factors: standing and sitting for long periods  (30 -> 45 min) ?Relieving factors: gentle movement ? ?Patient Goals: make back feel like normal, reduce pain ? ? ?OBJECTIVE: ?GENERAL OBSERVATION: ?           Increased anterior pelvic tilt ?  ?LUMBAR AROM ?  ?AROM AROM  ?01/03/2022  ?01/24/2022  ?02/15/2022  ?03/01/2022  ?Flexion WNL, w/ concordant pain WNL WNL WFL  ?Extension WNL, w/ concordant pain WNL - right sided lumbar tightness WNL - right sided lumbar tightness WFL  ?Right lateral flexion WNL WNL WNL WFL  ?Left lateral flexion WNL WNL WNL WFL  ?Right rotation WNL WNL WNL WFL  ?Left rotation WNL WNL WNL WFL  ?  ?LE MMT: ?  ?MMT Right ?01/03/2022 Left ?01/03/2022 Rt/ Lt ?01/09/2022 Rt / Lt ?01/17/2022 Rt / Lt ?02/15/2022 Rt / Lt ?03/01/2022  ?Hip flexion (L2, L3)        4 / 4  ?Knee extension (L3)        5 / 5  ?Knee flexion        5 / 5  ?Hip abduction 3+ 4 3+ / 4  3+ / 4 4 / 4  ?Hip extension 3* 3*  3+ / 3+ 3+ / 3+ 4 / 4-  ?Hip external rotation          ?Hip internal rotation          ?Hip adduction          ?Ankle dorsiflexion (L4)          ?  Ankle plantarflexion (S1)          ?Ankle inversion          ?Ankle eversion          ?Great Toe ext (L5)          ? (score listed is out of 5 possible points.  N = WNL, D = diminished, C = clear for gross weakness with myotome testing, * = concordant pain with testing) ?  ? PALPATION:  ?Tenderness of right lumbar paraspinals and QL with increased muscular tension compared to left ?  ?PATIENT SURVEYS:  ?FOTO  76% functional status neck - 01/30/2022 (66 initial - > 74 predicted) ?  ?  ?TODAY'S TREATMENT: ? ?03/21/2022: ?Therapeutic Exercise: ?NuStep L8 x 5 min with LE only, while taking subjective ?LE X over - 10x ea ?Lumbar locked bridge - 2x10 ea ?Modified side plank on knees with blue clam - 3 x 10 ?Modified plank - 60'' x1 ?Seated lumbar ext row - 40# - 3x10 ?D/L - 15# - 3x10 ? ?Manual therapy: ?Skilled palpation for manual therapy ?STM lumbar paraspinals ? ?Trigger Point Dry-Needling  ?Treatment instructions: Expect mild to  moderate muscle soreness. S/S of pneumothorax if dry needled over a lung field, and to seek immediate medical attention should they occur. Patient verbalized understanding of these instructions and education. ? ?Patient Consent Given: Yes ?Education handout provided: No ?Muscles treated: lumbar multifidus ?Electrical stimulation performed: No ?Parameters: N/A ?Treatment response/outcome: pain relief ? ?03/14/2022: ?Therapeutic Exercise: ?NuStep L7 x 5 min with LE only, while taking subjective ?Thomas stretch with passive knee flexion 2 x 60 sec each (NT) ?LTR x 10 ?Lumbar locked bridge - 2x10 ea ?Child's pose stretch 2 x 20 sec forward, 2 x 20 sec each side ?Posterior pelvic tilt 20x ?Modified side plank on knees with blue clam - 3 x 10 ?Modified front plank on knees 3 x 40 sec ?Bird dog 3 x 10 ?Seated lumbar ext and D/L (next visit) ? ?03/08/2022: ?Therapeutic Exercise: ?NuStep L7 x 5 min with LE only, while taking subjective ?Thomas stretch with passive knee flexion 2 x 60 sec each (NT) ?LTR x 10 ?Staggered bridge - 2x10 ea ?Child's pose stretch 2 x 20 sec forward, 2 x 20 sec each side ?Posterior pelvic tilt 20x ?Clamshell with blue 2 x 15 each ?Modified side plank on knees 3 x 20 sec each ?Modified front plank on knees 3 x 30 sec ?Bird dog 2 x 10 ? ?Manual therapy: ?Skilled palpation for manual therapy ?STM lumbar paraspinals ? ?Trigger Point Dry-Needling  ?Treatment instructions: Expect mild to moderate muscle soreness. S/S of pneumothorax if dry needled over a lung field, and to seek immediate medical attention should they occur. Patient verbalized understanding of these instructions and education. ? ?Patient Consent Given: Yes ?Education handout provided: No ?Muscles treated: lumbar multifidus ?Electrical stimulation performed: No ?Parameters: N/A ?Treatment response/outcome: pain relief ? ? ?03/01/2022: ?Therapeutic Exercise: ?NuStep L6 x 5 min with LE only, while taking subjective ?Bridge with focus on segmental  lift 2 x 10 with 5 sec hold ?Posterior pelvic tilt with SLR 2 x 10 each ?Posterior pelvic tilt with clamshell with blue 2 x 10 ?Bird dog 2 x 10 ? ? ?02/22/2022: ?Therapeutic Exercise: ?NuStep L6 x 5 min with LE only, while taking subjective ?Piriformis stretch 2 x 30 sec each ?Figure-4 LTR x 6 each ?Thomas stretch with passive knee flexion 2 x 60 sec each ?Posterior pelvic tilt 10 x 5 sec ?Posterior  pelvic tilt with SLR 2 x 5 each ?Bridge with focus on segmental lift 2 x 5 ?Posterior pelvic tilt with clamshell with blue 2 x 10 ?Hooklying physioball squeeze with alternating leg extension x 10 ?Child's pose x 30 sec forward, x 30 sec each side ? ?02/15/2022: ?Therapeutic Exercise: ?NuStep L5 x 5 min with LE only, while taking subjective ?Thomas stretch with passive knee flexion 2 x 60 sec each ?Piriformis stretch 2 x 30 sec each ?LTR x 10 ?Figure-4 LTR x 6 each ?Child's pose stretch 2 x 20 sec forward, 2 x 20 sec each side ?Posterior pelvic tilt with belt and FR 10 x 5 sec hold ?Clamshell with green 2 x 15 each ?Modified side plank on knees 3 x 15 sec each ?Modified front plank on knees 3 x 20 sec ?Bird dog 2 x 10 ? ?PATIENT EDUCATION:  ?POC extension, progress toward goals, HEP. Pt educated via explanation, demonstration. Pt confirms understanding verbally.  ?  ?HOME EXERCISE PROGRAM: ?Access Code: [email or web address] ? ?  ?ASSESSMENT: ?CLINICAL IMPRESSION: ?Rahel continues to do well with therapy.  She is able to do more at work with baseline pain around 3-4/10.  She is progressing strength and was able to tolerate standing exercises with no issue today.  Plan on D/C next visit barring significant change in status. ? ?Patient presents with pain and impairments/deficits in: pain-free lumbar ROM, hip and core strength.   ?  ?  ?GOALS: ?SHORT TERM GOALS: ?  ?STG Name Target Date Goal status  ?Elmer will be >75% HEP compliant to improve carryover between sessions and facilitate independent management of condition ?   ?Baseline: patient reports consistency with exercises 01/24/2022 ACHIEVED  ?  ?LONG TERM GOALS:  ?  ?LTG Name Target Date Goal status  ?1 Iana will improve FOTO score from 66 (baseline) to 74 as a proxy for

## 2022-03-28 ENCOUNTER — Encounter: Payer: Self-pay | Admitting: Physical Therapy

## 2022-03-28 ENCOUNTER — Ambulatory Visit: Payer: 59 | Admitting: Physical Therapy

## 2022-03-28 DIAGNOSIS — M6281 Muscle weakness (generalized): Secondary | ICD-10-CM | POA: Diagnosis not present

## 2022-03-28 DIAGNOSIS — M545 Low back pain, unspecified: Secondary | ICD-10-CM

## 2022-03-28 DIAGNOSIS — M542 Cervicalgia: Secondary | ICD-10-CM

## 2022-03-28 NOTE — Therapy (Signed)
?PHYSICAL THERAPY DISCHARGE SUMMARY ? ?Visits from Start of Care: 14 ? ?Current functional level related to goals / functional outcomes: ?See assessment/goals ?  ?Remaining deficits: ?See assessment/goals ?  ?Education / Equipment: ?HEP and D/C plans ? ?Patient agrees to discharge. Patient goals were met. Patient is being discharged due to meeting the stated rehab goals. ? ? ?Patient Name: [name] ?MRN: [id number] ?DOB:May 27, 1981, 41 y.o., female ?Today's Date: 03/28/2022 ? ?PCP: Caren Macadam, MD ?REFERRING PROVIDER: Caren Macadam, MD ? ? PT End of Session - 03/28/22 1614   ? ? Visit Number 14   ? Number of Visits 16   ? Date for PT Re-Evaluation 04/12/22   ? Authorization Type UMR   ? PT Start Time 1615   ? PT Stop Time 1657   ? PT Time Calculation (min) 42 min   ? Activity Tolerance Patient tolerated treatment well   ? Behavior During Therapy [facility] for tasks assessed/performed   ? ?  ?  ? ?  ? ? ? ? ? ? ? ? ?Past Medical History:  ?Diagnosis Date  ? Hypertension   ? IBS (irritable bowel syndrome) 04/13/2014  ? Sickle cell trait ([place])   ? Vertigo   ? ?History reviewed. No pertinent surgical history. ?Patient Active Problem List  ? Diagnosis Date Noted  ? Hypertension 02/17/2022  ? Lactose intolerance 06/27/2019  ? GERD (gastroesophageal reflux disease) 06/27/2019  ? Environmental allergies 11/15/2018  ? Bronchitis 11/15/2018  ? Wheezing 11/15/2018  ? Cough 11/15/2018  ? Chronic pain of right ankle 08/06/2017  ? Localized osteoarthritis of right ankle 08/06/2017  ? Syncope 11/25/2014  ? Leiomyoma of uterus 04/13/2014  ? Amenorrhea due to Depo Provera 04/13/2014  ? ? ?REFERRING PROVIDER: Garald Balding, MD ?  ?REFERRING DIAG: Acute bilateral low back pain with right-sided sciatica, Neck pain ? ?THERAPY DIAG:  ?Low back pain without sciatica, unspecified back pain laterality, unspecified chronicity ? ?Muscle weakness ? ?Cervicalgia ? ?PERTINENT HISTORY: None ? ?PRECAUTIONS:  None ? ? ?SUBJECTIVE:  ? ?Pt report that she has had no pain since TDN. She feels she is ready for D/C, but is a little nervous. ? ?PAIN:  ?Are you having pain? Yes ?NPRS scale: 0/10 ?Pain location: Back, right leg ?Pain orientation: Lower ?PAIN TYPE: Chronic ?Pain description: intermittent, pressure ?Aggravating factors: standing and sitting for long periods (30 -> 45 min) ?Relieving factors: gentle movement ? ?Patient Goals: make back feel like normal, reduce pain ? ? ?OBJECTIVE: ?GENERAL OBSERVATION: ?           Increased anterior pelvic tilt ?  ?LUMBAR AROM ?  ?AROM AROM  ?01/03/2022  ?01/24/2022  ?02/15/2022  ?03/01/2022  ?Flexion WNL, w/ concordant pain WNL WNL WFL  ?Extension WNL, w/ concordant pain WNL - right sided lumbar tightness WNL - right sided lumbar tightness WFL  ?Right lateral flexion WNL WNL WNL WFL  ?Left lateral flexion WNL WNL WNL WFL  ?Right rotation WNL WNL WNL WFL  ?Left rotation WNL WNL WNL WFL  ?  ?LE MMT: ?  ?MMT Right ?01/03/2022 Left ?01/03/2022 Rt/ Lt ?01/09/2022 Rt / Lt ?01/17/2022 Rt / Lt ?02/15/2022 Rt / Lt ?03/01/2022 R/L ?4/18  ?Hip flexion (L2, L3)        4 / 4 4+/4+  ?Knee extension (L3)        5 / 5   ?Knee flexion        5 / 5   ?Hip abduction 3+ 4 3+ /  4  3+ / 4 4 / 4 4+/4+  ?Hip extension 3* 3*  3+ / 3+ 3+ / 3+ 4 / 4- 4+/4  ?Hip external rotation           ?Hip internal rotation           ?Hip adduction           ?Ankle dorsiflexion (L4)           ?Ankle plantarflexion (S1)           ?Ankle inversion           ?Ankle eversion           ?Great Toe ext (L5)           ? (score listed is out of 5 possible points.  N = WNL, D = diminished, C = clear for gross weakness with myotome testing, * = concordant pain with testing) ?  ? PALPATION:  ?Tenderness of right lumbar paraspinals and QL with increased muscular tension compared to left ?  ?PATIENT SURVEYS:  ?FOTO  76% functional status neck - 01/30/2022 (66 initial - > 74 predicted) ?  ?  ?TODAY'S TREATMENT: ? ?03/28/2022: ?Therapeutic  Exercise: ?NuStep L8 x 5 min with LE only, while taking subjective ?LE X over - 10x ea ?Lumbar locked bridge - 3x10 ea ?Modified side plank on knees with blue clam - 3 x 10 ?Modified plank - 60'' x2 ?Seated lumbar ext row - 43# - 3x10 ? ?Therapeutic Activity ?- collecting information for goals, checking progress, and reviewing with patient ? ?03/21/2022: ?Therapeutic Exercise: ?NuStep L8 x 5 min with LE only, while taking subjective ?LE X over - 10x ea ?Lumbar locked bridge - 2x10 ea ?Modified side plank on knees with blue clam - 3 x 10 ?Modified plank - 60'' x1 ?Seated lumbar ext row - 40# - 3x10 ?D/L - 15# - 3x10 ? ?Manual therapy: ?Skilled palpation for manual therapy ?STM lumbar paraspinals ? ?Trigger Point Dry-Needling  ?Treatment instructions: Expect mild to moderate muscle soreness. S/S of pneumothorax if dry needled over a lung field, and to seek immediate medical attention should they occur. Patient verbalized understanding of these instructions and education. ? ?Patient Consent Given: Yes ?Education handout provided: No ?Muscles treated: lumbar multifidus ?Electrical stimulation performed: No ?Parameters: N/A ?Treatment response/outcome: pain relief ? ?03/14/2022: ?Therapeutic Exercise: ?NuStep L7 x 5 min with LE only, while taking subjective ?Thomas stretch with passive knee flexion 2 x 60 sec each (NT) ?LTR x 10 ?Lumbar locked bridge - 2x10 ea ?Child's pose stretch 2 x 20 sec forward, 2 x 20 sec each side ?Posterior pelvic tilt 20x ?Modified side plank on knees with blue clam - 3 x 10 ?Modified front plank on knees 3 x 40 sec ?Bird dog 3 x 10 ?Seated lumbar ext and D/L (next visit) ? ?03/08/2022: ?Therapeutic Exercise: ?NuStep L7 x 5 min with LE only, while taking subjective ?Thomas stretch with passive knee flexion 2 x 60 sec each (NT) ?LTR x 10 ?Staggered bridge - 2x10 ea ?Child's pose stretch 2 x 20 sec forward, 2 x 20 sec each side ?Posterior pelvic tilt 20x ?Clamshell with blue 2 x 15 each ?Modified  side plank on knees 3 x 20 sec each ?Modified front plank on knees 3 x 30 sec ?Bird dog 2 x 10 ? ?Manual therapy: ?Skilled palpation for manual therapy ?STM lumbar paraspinals ? ?Trigger Point Dry-Needling  ?Treatment instructions: Expect mild to moderate muscle soreness. S/S of pneumothorax  if dry needled over a lung field, and to seek immediate medical attention should they occur. Patient verbalized understanding of these instructions and education. ? ?Patient Consent Given: Yes ?Education handout provided: No ?Muscles treated: lumbar multifidus ?Electrical stimulation performed: No ?Parameters: N/A ?Treatment response/outcome: pain relief ? ? ?03/01/2022: ?Therapeutic Exercise: ?NuStep L6 x 5 min with LE only, while taking subjective ?Bridge with focus on segmental lift 2 x 10 with 5 sec hold ?Posterior pelvic tilt with SLR 2 x 10 each ?Posterior pelvic tilt with clamshell with blue 2 x 10 ?Bird dog 2 x 10 ? ? ?02/22/2022: ?Therapeutic Exercise: ?NuStep L6 x 5 min with LE only, while taking subjective ?Piriformis stretch 2 x 30 sec each ?Figure-4 LTR x 6 each ?Thomas stretch with passive knee flexion 2 x 60 sec each ?Posterior pelvic tilt 10 x 5 sec ?Posterior pelvic tilt with SLR 2 x 5 each ?Bridge with focus on segmental lift 2 x 5 ?Posterior pelvic tilt with clamshell with blue 2 x 10 ?Hooklying physioball squeeze with alternating leg extension x 10 ?Child's pose x 30 sec forward, x 30 sec each side ? ?02/15/2022: ?Therapeutic Exercise: ?NuStep L5 x 5 min with LE only, while taking subjective ?Thomas stretch with passive knee flexion 2 x 60 sec each ?Piriformis stretch 2 x 30 sec each ?LTR x 10 ?Figure-4 LTR x 6 each ?Child's pose stretch 2 x 20 sec forward, 2 x 20 sec each side ?Posterior pelvic tilt with belt and FR 10 x 5 sec hold ?Clamshell with green 2 x 15 each ?Modified side plank on knees 3 x 15 sec each ?Modified front plank on knees 3 x 20 sec ?Bird dog 2 x 10 ? ?PATIENT EDUCATION:  ?POC extension,  progress toward goals, HEP. Pt educated via explanation, demonstration. Pt confirms understanding verbally.  ?  ?HOME EXERCISE PROGRAM: ?Access Code: [email or web address] ? ?  ?ASSESSMENT: ?CLINICAL IMPRESSION: ?[name] has progress

## 2022-04-08 ENCOUNTER — Encounter (HOSPITAL_COMMUNITY): Payer: Self-pay

## 2022-04-08 ENCOUNTER — Ambulatory Visit (HOSPITAL_COMMUNITY)
Admission: EM | Admit: 2022-04-08 | Discharge: 2022-04-08 | Disposition: A | Discharge status: Home / Self Care | Payer: 59 | Attending: Emergency Medicine | Admitting: Emergency Medicine

## 2022-04-08 DIAGNOSIS — K047 Periapical abscess without sinus: Secondary | ICD-10-CM | POA: Diagnosis not present

## 2022-04-08 DIAGNOSIS — K0889 Other specified disorders of teeth and supporting structures: Secondary | ICD-10-CM | POA: Diagnosis not present

## 2022-04-08 MED ORDER — PENICILLIN V POTASSIUM 500 MG PO TABS: 500.0000 mg | ORAL_TABLET | Freq: Three times a day (TID) | ORAL | 0 refills | Status: AC

## 2022-04-08 MED ORDER — IBUPROFEN 100 MG/5ML PO SUSP
ORAL | Status: AC
  Filled 2022-04-08: qty 40

## 2022-04-08 MED ORDER — FLUCONAZOLE 150 MG PO TABS: ORAL_TABLET | ORAL | 1 refills | Status: DC

## 2022-04-08 MED ORDER — IBUPROFEN 800 MG PO TABS: 800.0000 mg | ORAL_TABLET | Freq: Three times a day (TID) | ORAL | 0 refills | Status: DC | PRN

## 2022-04-08 MED ORDER — IBUPROFEN 800 MG PO TABS
800.0000 mg | ORAL_TABLET | Freq: Once | ORAL | Status: AC
  Administered 2022-04-08: 800 mg via ORAL

## 2022-04-08 NOTE — Discharge Instructions (Signed)
For the infection in your left lower jaw, please begin penicillin.  Take 1 tablet 3 times daily for the next 14 days. ? ?Because we know that antibiotics can cause vaginal yeast infections, I provided you with a prescription for Diflucan.  Please take the first tablet on the fourth day of penicillin, the second tablet 3 days after the first tablet and the third tablet 3 days after the second tablet. ? ?I have also provided you with a prescription for ibuprofen 800 mg that you can take 3 times daily as needed for pain. ? ?Thank you for visiting urgent care today. ?

## 2022-04-08 NOTE — ED Triage Notes (Signed)
Pt presents with facial swelling and dental pain X 2 days; pt is scheduled to have oral surgery next week. ?

## 2022-04-10 NOTE — ED Provider Notes (Signed)
?[facility] ? ? ? ?CSN: [id number] ?Arrival date & time: 04/08/22  1509 ?  ? ?HISTORY  ? ?Chief Complaint  ?Patient presents with  ? Dental Pain  ? ?HPI ?[name] is a 41 y.o. female. Pt presents with facial swelling and dental pain X 2 days; pt is scheduled to have oral surgery next week.  Patient states she thinks her wisdom tooth on the lower left side is infected. ? ?The history is provided by the patient.  ?Past Medical History:  ?Diagnosis Date  ? Hypertension   ? IBS (irritable bowel syndrome) 04/13/2014  ? Sickle cell trait ([place])   ? Vertigo   ? ?Patient Active Problem List  ? Diagnosis Date Noted  ? Hypertension 02/17/2022  ? Lactose intolerance 06/27/2019  ? GERD (gastroesophageal reflux disease) 06/27/2019  ? Environmental allergies 11/15/2018  ? Bronchitis 11/15/2018  ? Wheezing 11/15/2018  ? Cough 11/15/2018  ? Chronic pain of right ankle 08/06/2017  ? Localized osteoarthritis of right ankle 08/06/2017  ? Syncope 11/25/2014  ? Leiomyoma of uterus 04/13/2014  ? Amenorrhea due to Depo Provera 04/13/2014  ? ?History reviewed. No pertinent surgical history. ?OB History   ? ? Gravida  ?3  ? Para  ?1  ? Term  ?0  ? Preterm  ?1  ? AB  ?2  ? Living  ?0  ?  ? ? SAB  ?2  ? IAB  ?   ? Ectopic  ?   ? Multiple  ?   ? Live Births  ?   ?   ?  ?  ? ?Home Medications   ? ?Prior to Admission medications   ?Medication Sig Start Date End Date Taking? Authorizing Provider  ?fluconazole (DIFLUCAN) 150 MG tablet Take first tablet on the fourth day of penicillin.  Take second tablet 3 days after first tablet.  Take third tablet 3 days after second tablet. 04/08/22  Yes Lynden Oxford Scales, PA-C  ?ibuprofen (ADVIL) 800 MG tablet Take 1 tablet (800 mg total) by mouth every 8 (eight) hours as needed for up to 21 doses for fever, headache, mild pain or moderate pain. 04/08/22  Yes Lynden Oxford Scales, PA-C  ?penicillin v potassium (VEETID) 500 MG tablet Take 1 tablet (500 mg total) by mouth 3 (three)  times daily for 14 days. 04/08/22 04/22/22 Yes Lynden Oxford Scales, PA-C  ?albuterol (PROAIR HFA) 108 (90 Base) MCG/ACT inhaler Inhale 2 puffs into the lungs every 6 (six) hours as needed for wheezing or shortness of breath. 02/17/22   Koberlein, Steele Berg, MD  ?amLODipine (NORVASC) 2.5 MG tablet TAKE 1 TABLET BY MOUTH DAILY. PATIENT NEEDS AN APPOINTMENT 01/09/22 01/09/23  Caren Macadam, MD  ?chlorhexidine (PERIDEX) 0.12 % solution Use as directed 2 times a day for 1 minute 02/18/22     ?fluticasone (FLONASE) 50 MCG/ACT nasal spray USE 1 SPRAY INTO BOTH NOSTRILS DAILY. 02/17/22 02/17/23  Caren Macadam, MD  ?methocarbamol (ROBAXIN) 500 MG tablet Take 1 tablet (500 mg total) by mouth 2 (two) times daily. 10/18/21   Henderly, Britni A, PA-C  ?montelukast (SINGULAIR) 10 MG tablet Take 1 tablet (10 mg total) by mouth at bedtime. 02/17/22 05/23/22  Caren Macadam, MD  ?omeprazole (PRILOSEC) 20 MG capsule Take 1 capsule (20 mg total) by mouth daily. 08/12/21   Caren Macadam, MD  ?Prenatal Vit-Fe Fumarate-FA (PRENATAL MULTIVITAMIN) TABS tablet Take 1 tablet by mouth daily at 12 noon.    [provider]  ?tinidazole (  TINDAMAX) 500 MG tablet Take 2 tablets by mouth 2 times a day for 2 days 03/21/22   Princess Bruins, MD  ? ? ?Family History ?Family History  ?Problem Relation Age of Onset  ? Hypertension Mother   ? Diabetes Maternal Aunt   ? Hypertension Maternal Aunt   ? ?Social History ?Social History  ? ?Tobacco Use  ? Smoking status: Never  ? Smokeless tobacco: Never  ?Vaping Use  ? Vaping Use: Never used  ?Substance Use Topics  ? Alcohol use: No  ? Drug use: No  ? ?Allergies   ?Latex and Lidocaine ? ?Review of Systems ?Review of Systems ?Pertinent findings noted in history of present illness.  ? ?Physical Exam ?Triage Vital Signs ?ED Triage Vitals  ?Enc Vitals Group  ?   BP 10/07/21 0827 (!) 147/82  ?   Pulse Rate 10/07/21 0827 72  ?   Resp 10/07/21 0827 18  ?   Temp 10/07/21 0827 98.3 ?F (36.8 ?C)   ?   Temp Source 10/07/21 0827 Oral  ?   SpO2 10/07/21 0827 98 %  ?   Weight --   ?   Height --   ?   Head Circumference --   ?   Peak Flow --   ?   Pain Score 10/07/21 0826 5  ?   Pain Loc --   ?   Pain Edu? --   ?   Excl. in Saline? --   ?No data found. ? ?Updated Vital Signs ?BP 131/76 (BP Location: Left Arm)   Pulse 70   Temp 99.1 ?F (37.3 ?C) (Oral)   Resp 17   SpO2 100%  ? ?Physical Exam ?Vitals and nursing note reviewed.  ?Constitutional:   ?   General: She is not in acute distress. ?   Appearance: Normal appearance. She is not ill-appearing.  ?HENT:  ?   Head:  ?   Salivary Glands: Right salivary gland is not diffusely enlarged or tender. Left salivary gland is not diffusely enlarged or tender.  ?   Comments: Significant swelling of lower left jaw ?   Right Ear: Tympanic membrane, ear canal and external ear normal. No drainage. No middle ear effusion. There is no impacted cerumen. Tympanic membrane is not erythematous or bulging.  ?   Left Ear: Tympanic membrane, ear canal and external ear normal. No drainage.  No middle ear effusion. There is no impacted cerumen. Tympanic membrane is not erythematous or bulging.  ?   Nose: Nose normal. No nasal deformity, septal deviation, mucosal edema, congestion or rhinorrhea.  ?   Right Turbinates: Not enlarged, swollen or pale.  ?   Left Turbinates: Not enlarged, swollen or pale.  ?   Right Sinus: No maxillary sinus tenderness or frontal sinus tenderness.  ?   Left Sinus: No maxillary sinus tenderness or frontal sinus tenderness.  ?   Mouth/Throat:  ?   Lips: Pink. No lesions.  ?   Mouth: Mucous membranes are moist. No oral lesions.  ?   Pharynx: Oropharynx is clear. Uvula midline. No posterior oropharyngeal erythema or uvula swelling.  ?   Tonsils: No tonsillar exudate. 0 on the right. 0 on the left.  ?Eyes:  ?   General: Lids are normal.     ?   Right eye: No discharge.     ?   Left eye: No discharge.  ?   Extraocular Movements: Extraocular movements intact.  ?    Conjunctiva/sclera: Conjunctivae normal.  ?  Right eye: Right conjunctiva is not injected.  ?   Left eye: Left conjunctiva is not injected.  ?Neck:  ?   Trachea: Trachea and phonation normal.  ?Cardiovascular:  ?   Rate and Rhythm: Normal rate and regular rhythm.  ?   Pulses: Normal pulses.  ?   Heart sounds: Normal heart sounds. No murmur heard. ?  No friction rub. No gallop.  ?Pulmonary:  ?   Effort: Pulmonary effort is normal. No accessory muscle usage, prolonged expiration or respiratory distress.  ?   Breath sounds: Normal breath sounds. No stridor, decreased air movement or transmitted upper airway sounds. No decreased breath sounds, wheezing, rhonchi or rales.  ?Chest:  ?   Chest wall: No tenderness.  ?Musculoskeletal:     ?   General: Normal range of motion.  ?   Cervical back: Normal range of motion and neck supple. Normal range of motion.  ?Lymphadenopathy:  ?   Cervical: No cervical adenopathy.  ?Skin: ?   General: Skin is warm and dry.  ?   Findings: No erythema or rash.  ?Neurological:  ?   General: No focal deficit present.  ?   Mental Status: She is alert and oriented to person, place, and time.  ?Psychiatric:     ?   Mood and Affect: Mood normal.     ?   Behavior: Behavior normal.  ? ? ?Visual Acuity ?Right Eye Distance:   ?Left Eye Distance:   ?Bilateral Distance:   ? ?Right Eye Near:   ?Left Eye Near:    ?Bilateral Near:    ? ?UC Couse / Diagnostics / Procedures:  ?  ?EKG ? ?Radiology ?No results found. ? ?Procedures ?Procedures (including critical care time) ? ?UC Diagnoses / Final Clinical Impressions(s)   ?I have reviewed the triage vital signs and the nursing notes. ? ?Pertinent labs & imaging results that were available during my care of the patient were reviewed by me and considered in my medical decision making (see chart for details).   ? ?Final diagnoses:  ?Pain, dental  ?Dental infection  ?Dental abscess  ? ?Patient provided with 14 days of penicillin and Diflucan for the inevitable  vaginal yeast infection caused by 14 days of penicillin.  Patient advised to make sure she keeps her appointment with her dentist. ? ?ED Prescriptions   ? ? Medication Sig Dispense Auth. Provider  ? penicillin v p

## 2022-04-11 ENCOUNTER — Other Ambulatory Visit (HOSPITAL_COMMUNITY): Payer: Self-pay

## 2022-04-11 MED ORDER — METHYLPREDNISOLONE 4 MG PO TBPK
ORAL_TABLET | ORAL | 0 refills | Status: DC
  Filled 2022-04-11: qty 21, 6d supply, fill #0

## 2022-04-17 ENCOUNTER — Other Ambulatory Visit (HOSPITAL_COMMUNITY): Payer: Self-pay

## 2022-04-17 ENCOUNTER — Other Ambulatory Visit: Payer: Self-pay | Admitting: Family Medicine

## 2022-04-17 DIAGNOSIS — I10 Essential (primary) hypertension: Secondary | ICD-10-CM

## 2022-04-17 MED ORDER — AMLODIPINE BESYLATE 2.5 MG PO TABS
ORAL_TABLET | ORAL | 0 refills | Status: DC
  Filled 2022-04-17 – 2022-04-27 (×2): qty 90, 90d supply, fill #0

## 2022-04-24 ENCOUNTER — Other Ambulatory Visit (HOSPITAL_COMMUNITY): Payer: Self-pay

## 2022-04-26 ENCOUNTER — Other Ambulatory Visit (HOSPITAL_COMMUNITY): Payer: Self-pay

## 2022-04-27 ENCOUNTER — Other Ambulatory Visit (HOSPITAL_COMMUNITY): Payer: Self-pay

## 2022-07-06 ENCOUNTER — Encounter: Payer: Self-pay | Admitting: Family Medicine

## 2022-07-06 ENCOUNTER — Ambulatory Visit: Payer: 59 | Admitting: Family Medicine

## 2022-07-06 ENCOUNTER — Other Ambulatory Visit (HOSPITAL_COMMUNITY): Payer: Self-pay

## 2022-07-06 VITALS — BP 120/78 | HR 65 | Temp 98.2°F | Ht 59.25 in | Wt 153.1 lb

## 2022-07-06 DIAGNOSIS — H81392 Other peripheral vertigo, left ear: Secondary | ICD-10-CM | POA: Insufficient documentation

## 2022-07-06 DIAGNOSIS — I10 Essential (primary) hypertension: Secondary | ICD-10-CM | POA: Diagnosis not present

## 2022-07-06 DIAGNOSIS — E559 Vitamin D deficiency, unspecified: Secondary | ICD-10-CM | POA: Diagnosis not present

## 2022-07-06 MED ORDER — AMLODIPINE BESYLATE 2.5 MG PO TABS
ORAL_TABLET | ORAL | 3 refills | Status: DC
  Filled 2022-07-06: qty 90, 90d supply, fill #0
  Filled 2022-10-18: qty 90, 90d supply, fill #1
  Filled 2023-01-19: qty 90, 90d supply, fill #2
  Filled 2023-04-10: qty 90, 90d supply, fill #3

## 2022-07-06 MED ORDER — VITAMIN D3 50 MCG (2000 UT) PO CAPS
2000.0000 [IU] | ORAL_CAPSULE | Freq: Every day | ORAL | 5 refills | Status: AC
  Filled 2022-07-06: qty 30, 30d supply, fill #0

## 2022-07-06 MED ORDER — MECLIZINE HCL 25 MG PO TABS
25.0000 mg | ORAL_TABLET | Freq: Every day | ORAL | 0 refills | Status: AC
  Filled 2022-07-06: qty 30, 30d supply, fill #0

## 2022-07-06 MED ORDER — PREDNISONE 20 MG PO TABS
20.0000 mg | ORAL_TABLET | Freq: Every day | ORAL | 0 refills | Status: AC
  Filled 2022-07-06: qty 4, 4d supply, fill #0

## 2022-07-06 NOTE — Assessment & Plan Note (Signed)
Chronic, ongoing and intermittent. I will prescribe meclizine for her to use at night as needed. I also will give her a steroid burst to help reduce sinus congestion/ middle ear effusion. I went over the Epley maneuver handout with her and advised she try this at home to see if this will reduce her episodes of vertigo. If this does not help and the vertigo is very bothersome to her then I will send her to ENT for further management.

## 2022-07-06 NOTE — Progress Notes (Signed)
Established Patient Office Visit  Subjective   Patient ID: [name], female    DOB: [birth year]  Age: 41 y.o. MRN: [id number]  Chief Complaint  Patient presents with   Establish Care   Dizziness    X2 days    Pt reports she has a history of vertigo, states that she had bloodwork done in March and it showed her vitamin D was slightly low. Otherwise she has no other associated symptoms. States that she has sinus issues/ drainage and she take OTC antihistamines for this.   HTN - patient needs refills of her amlodipine 2.5 mg. She reports she is taking as prescribed, no headaches, chest pain or dyspnea, tolerating the medication well without side effects.  Dizziness This is a new problem. The current episode started yesterday. The problem occurs 2 to 4 times per day. Pertinent negatives include no chills or fever. The symptoms are aggravated by bending. She has tried drinking for the symptoms. The treatment provided mild relief.    Patient Active Problem List   Diagnosis Date Noted   Vertigo, peripheral, left 07/06/2022   Vitamin D deficiency 07/06/2022   Hypertension 02/17/2022   Lactose intolerance 06/27/2019   GERD (gastroesophageal reflux disease) 06/27/2019   Environmental allergies 11/15/2018   Bronchitis 11/15/2018   Wheezing 11/15/2018   Cough 11/15/2018   Chronic pain of right ankle 08/06/2017   Localized osteoarthritis of right ankle 08/06/2017   Syncope 11/25/2014   Leiomyoma of uterus 04/13/2014   Amenorrhea due to Depo Provera 04/13/2014      Review of Systems  Constitutional:  Negative for chills and fever.  HENT:  Negative for ear pain, hearing loss and tinnitus.   Eyes:  Negative for blurred vision.  Neurological:  Positive for dizziness.  All other systems reviewed and are negative.     Objective:     BP 120/78 (BP Location: Left Arm, Patient Position: Sitting, Cuff Size: Normal)   Pulse 65   Temp 98.2 F (36.8 C) (Oral)   Ht 4'  11.25" (1.505 m)   Wt 153 lb 1.6 oz (69.4 kg)   LMP 07/02/2022 (Exact Date)   SpO2 98%   BMI 30.66 kg/m    Physical Exam Vitals reviewed.  HENT:     Right Ear: Hearing, tympanic membrane, ear canal and external ear normal.     Left Ear: Hearing, tympanic membrane, ear canal and external ear normal.     Nose: No congestion.     Right Sinus: No maxillary sinus tenderness.     Left Sinus: No maxillary sinus tenderness.      No results found for any visits on 07/06/22.  Last metabolic panel Lab Results  Component Value Date   GLUCOSE 76 02/17/2022   NA 136 02/17/2022   K 3.9 02/17/2022   CL 103 02/17/2022   CO2 28 02/17/2022   BUN 13 02/17/2022   CREATININE 0.85 02/17/2022   GFRNONAA 78 (L) 11/23/2014   CALCIUM 9.1 02/17/2022   PROT 7.3 02/17/2022   ALBUMIN 4.1 02/17/2022   BILITOT 0.3 02/17/2022   ALKPHOS 65 02/17/2022   AST 18 02/17/2022   ALT 11 02/17/2022   ANIONGAP 13 11/23/2014   Last vitamin D Lab Results  Component Value Date   VD25OH 24.15 (L) 02/17/2022      The 10-year ASCVD risk score (Arnett DK, et al., 2019) is: 0.8%    Assessment & Plan:   Problem List Items Addressed This Visit  Cardiovascular and Mediastinum   Hypertension - Primary    Very well controlled on amlodipine 2.5 mg. I refilled this medication for her today.      Relevant Medications   amLODipine (NORVASC) 2.5 MG tablet     Nervous and Auditory   Vertigo, peripheral, left (Chronic)    Chronic, ongoing and intermittent. I will prescribe meclizine for her to use at night as needed. I also will give her a steroid burst to help reduce sinus congestion/ middle ear effusion. I went over the Epley maneuver handout with her and advised she try this at home to see if this will reduce her episodes of vertigo. If this does not help and the vertigo is very bothersome to her then I will send her to ENT for further management.      Relevant Medications   meclizine (ANTIVERT) 25 MG  tablet   predniSONE (DELTASONE) 20 MG tablet     Other   Vitamin D deficiency (Chronic)    Reviewed labs, will order vitamin D supplements daily.      Relevant Medications   Cholecalciferol (VITAMIN D3) 50 MCG (2000 UT) capsule   Other Visit Diagnoses     Essential hypertension       Relevant Medications   amLODipine (NORVASC) 2.5 MG tablet       No follow-ups on file.    Farrel Conners, MD

## 2022-07-06 NOTE — Assessment & Plan Note (Signed)
Very well controlled on amlodipine 2.5 mg. I refilled this medication for her today.

## 2022-07-06 NOTE — Assessment & Plan Note (Signed)
Reviewed labs, will order vitamin D supplements daily.

## 2022-07-07 ENCOUNTER — Other Ambulatory Visit (HOSPITAL_COMMUNITY): Payer: Self-pay

## 2022-07-08 ENCOUNTER — Other Ambulatory Visit (HOSPITAL_COMMUNITY): Payer: Self-pay

## 2022-07-11 ENCOUNTER — Other Ambulatory Visit (HOSPITAL_COMMUNITY): Payer: Self-pay

## 2022-07-25 ENCOUNTER — Ambulatory Visit: Payer: 59 | Admitting: Radiology

## 2022-07-25 ENCOUNTER — Other Ambulatory Visit (HOSPITAL_COMMUNITY): Payer: Self-pay

## 2022-07-25 VITALS — BP 116/64

## 2022-07-25 DIAGNOSIS — N898 Other specified noninflammatory disorders of vagina: Secondary | ICD-10-CM | POA: Diagnosis not present

## 2022-07-25 DIAGNOSIS — R102 Pelvic and perineal pain: Secondary | ICD-10-CM

## 2022-07-25 DIAGNOSIS — Z113 Encounter for screening for infections with a predominantly sexual mode of transmission: Secondary | ICD-10-CM

## 2022-07-25 LAB — WET PREP FOR TRICH, YEAST, CLUE

## 2022-07-25 MED ORDER — METRONIDAZOLE 500 MG PO TABS
500.0000 mg | ORAL_TABLET | Freq: Two times a day (BID) | ORAL | 0 refills | Status: DC
  Filled 2022-07-25: qty 14, 7d supply, fill #0

## 2022-07-25 NOTE — Progress Notes (Signed)
   [place] 06/09/1981 [id number]   History:  41 y.o. Complains of lower pelvic pain, lower back pain, urinary frequency (x's 1 week), no dysuria, no urgency. Recently separated from partner of 10 years. Elects for STI screen.  Gynecologic History Patient's last menstrual period was 07/02/2022 (exact date).   Contraception/Family planning: abstinence Sexually active: no   Obstetric History OB History  Gravida Para Term Preterm AB Living  3 1 0 1 2 0  SAB IAB Ectopic Multiple Live Births  2            # Outcome Date GA Lbr Len/2nd Weight Sex Delivery Anes PTL Lv  3 Preterm 2010 [redacted]w[redacted]d  M Vag-Spont        Birth Comments: cerclage, placenta previa  2 SAB 2003 274w0d F      1 SAB 2001 1672w0dM         The following portions of the patient's history were reviewed and updated as appropriate: allergies, current medications, past family history, past medical history, past social history, past surgical history, and problem list.  Review of Systems Pertinent items noted in HPI and remainder of comprehensive ROS otherwise negative.   Past medical history, past surgical history, family history and social history were all reviewed and documented in the EPIC chart.   Exam:  Vitals:   07/25/22 1429  BP: 116/64   There is no height or weight on file to calculate BMI.  General appearance:  Normal Abdominal  Soft,nontender, without masses, guarding or rebound.  Liver/spleen:  No organomegaly noted  Hernia:  None appreciated  Skin  Inspection:  Grossly normal Genitourinary   Inguinal/mons:  Normal without inguinal adenopathy  External genitalia:  Normal appearing vulva with no masses, tenderness, or lesions  BUS/Urethra/Skene's glands:  Normal without masses or exudate  Vagina:  Normal appearing with normal color and discharge, no lesions  Cervix:  Normal appearing without discharge or lesions  Uterus:  Normal in size, shape and contour.  Mobile,  nontender  Adnexa/parametria:     Rt: Normal in size, without masses or tenderness.   Lt: Normal in size, without masses or tenderness.  Anus and perineum: Normal   Chaperone offered and declines  Urine dipstick shows positive for RBC's.  Micro exam: 0-5 WBC's per HPF, 3-10 RBC's per HPF, and few+ bacteria.   Assessment/Plan:   1. Pelvic pain  - Urinalysis,Complete w/RFL Culture - Pregnancy, urine  2. Screen for sexually transmitted diseases  - SURESWAB CT/NG/T. vaginalis  3. Vaginal discharge +BV, rx sent for flagyl '500mg'$  po BID x 7days - WET PREP FOR [place], CLUE   Will contact with results of STI screen and urine culture. Push fluids. Safe sex encouraged.  CHRRubbie BattiestWHNP-BC 2:44 PM 07/25/2022

## 2022-07-26 LAB — SURESWAB CT/NG/T. VAGINALIS
C. trachomatis RNA, TMA: NOT DETECTED
N. gonorrhoeae RNA, TMA: NOT DETECTED
Trichomonas vaginalis RNA: NOT DETECTED

## 2022-07-27 ENCOUNTER — Telehealth: Payer: Self-pay | Admitting: *Deleted

## 2022-07-27 LAB — CULTURE INDICATED

## 2022-07-27 LAB — URINE CULTURE
MICRO NUMBER:: 13781346
SPECIMEN QUALITY:: ADEQUATE

## 2022-07-27 LAB — URINALYSIS, COMPLETE W/RFL CULTURE
Bilirubin Urine: NEGATIVE
Glucose, UA: NEGATIVE
Hyaline Cast: NONE SEEN /LPF
Ketones, ur: NEGATIVE
Leukocyte Esterase: NEGATIVE
Nitrites, Initial: NEGATIVE
Protein, ur: NEGATIVE
Specific Gravity, Urine: 1.02 (ref 1.001–1.035)
pH: 5.5 (ref 5.0–8.0)

## 2022-07-27 LAB — PREGNANCY, URINE: Preg Test, Ur: NEGATIVE

## 2022-07-27 NOTE — Telephone Encounter (Signed)
Patient called and left message in triage voicemail about results, I left message for patient to call.

## 2022-08-15 ENCOUNTER — Encounter: Payer: Self-pay | Admitting: Family Medicine

## 2022-08-15 ENCOUNTER — Ambulatory Visit (INDEPENDENT_AMBULATORY_CARE_PROVIDER_SITE_OTHER): Payer: 59 | Admitting: Family Medicine

## 2022-08-15 ENCOUNTER — Other Ambulatory Visit (HOSPITAL_COMMUNITY): Payer: Self-pay

## 2022-08-15 VITALS — BP 110/80 | HR 60 | Temp 98.4°F | Ht 59.5 in | Wt 152.7 lb

## 2022-08-15 DIAGNOSIS — I10 Essential (primary) hypertension: Secondary | ICD-10-CM | POA: Diagnosis not present

## 2022-08-15 DIAGNOSIS — Z23 Encounter for immunization: Secondary | ICD-10-CM

## 2022-08-15 DIAGNOSIS — Z0000 Encounter for general adult medical examination without abnormal findings: Secondary | ICD-10-CM | POA: Diagnosis not present

## 2022-08-15 DIAGNOSIS — E559 Vitamin D deficiency, unspecified: Secondary | ICD-10-CM

## 2022-08-15 DIAGNOSIS — K219 Gastro-esophageal reflux disease without esophagitis: Secondary | ICD-10-CM | POA: Diagnosis not present

## 2022-08-15 LAB — VITAMIN D 25 HYDROXY (VIT D DEFICIENCY, FRACTURES): VITD: 24.22 ng/mL — ABNORMAL LOW (ref 30.00–100.00)

## 2022-08-15 LAB — TSH: TSH: 1.57 u[IU]/mL (ref 0.35–5.50)

## 2022-08-15 MED ORDER — OMEPRAZOLE 20 MG PO CPDR
20.0000 mg | DELAYED_RELEASE_CAPSULE | Freq: Every day | ORAL | 1 refills | Status: DC
  Filled 2022-08-15: qty 90, 90d supply, fill #0

## 2022-08-15 NOTE — Progress Notes (Signed)
   Established Patient Office Visit  Subjective   Patient ID: DONN WILMOT, female    DOB: 11-15-1981  Age: 41 y.o. MRN: [id number]  Chief Complaint  Patient presents with  . Annual Exam    HPI  {History (Optional):23778}  ROS    Objective:     BP 110/80 (BP Location: Left Arm, Cuff Size: Normal)   Pulse 60   Temp 98.4 F (36.9 C) (Oral)   Ht 4' 11.5" (1.511 m)   Wt 152 lb 11.2 oz (69.3 kg)   LMP 07/29/2022 (Exact Date)   SpO2 98%   BMI 30.33 kg/m  {Vitals History (Optional):23777}  Physical Exam   No results found for any visits on 08/15/22.  {Labs (Optional):23779}  The 10-year ASCVD risk score (Arnett DK, et al., 2019) is: 0.5%    Assessment & Plan:   Problem List Items Addressed This Visit       Cardiovascular and Mediastinum   Hypertension   Relevant Orders   TSH     Digestive   GERD (gastroesophageal reflux disease)   Relevant Medications   omeprazole (PRILOSEC) 20 MG capsule     Other   Vitamin D deficiency (Chronic)   Relevant Orders   Vitamin D, 25-hydroxy   Other Visit Diagnoses     Need for immunization against influenza    -  Primary   Relevant Orders   Flu Vaccine QUAD 6+ mos PF IM (Fluarix Quad PF) (Completed)   Routine general medical examination at a health care facility           Return in 6 months (on 02/13/2023).    Farrel Conners, MD

## 2022-08-15 NOTE — Patient Instructions (Addendum)
Try Lactaid capsules or "Dairy Relief" capsules -- take before a meal to help reduce your lactose intolerance.   Fairlife milk or Lactose free milk

## 2022-08-16 NOTE — Assessment & Plan Note (Signed)
Stable sx per patient's report on prilosec 20 mg daily, will refill this for her.

## 2022-08-16 NOTE — Assessment & Plan Note (Signed)
Needs new level today, continue 2000 unit supplements daily of vitamin D

## 2022-08-16 NOTE — Assessment & Plan Note (Signed)
Well controlled, on 2.5 mg amlodipine daily.

## 2022-08-18 ENCOUNTER — Other Ambulatory Visit (HOSPITAL_COMMUNITY): Payer: Self-pay

## 2022-08-24 ENCOUNTER — Other Ambulatory Visit (HOSPITAL_COMMUNITY): Payer: Self-pay

## 2022-08-24 ENCOUNTER — Telehealth (INDEPENDENT_AMBULATORY_CARE_PROVIDER_SITE_OTHER): Payer: 59 | Admitting: Family Medicine

## 2022-08-24 VITALS — Temp 98.8°F | Ht 59.5 in | Wt 152.0 lb

## 2022-08-24 DIAGNOSIS — R0981 Nasal congestion: Secondary | ICD-10-CM

## 2022-08-24 DIAGNOSIS — R059 Cough, unspecified: Secondary | ICD-10-CM | POA: Diagnosis not present

## 2022-08-24 MED ORDER — BENZONATATE 100 MG PO CAPS
ORAL_CAPSULE | ORAL | 0 refills | Status: DC
  Filled 2022-08-24: qty 20, 5d supply, fill #0

## 2022-08-24 NOTE — Progress Notes (Signed)
Virtual Visit via Video Note  I connected with [name]  on 08/24/22 at  4:00 PM EDT by a video enabled telemedicine application and verified that I am speaking with the correct person using two identifiers.  Location patient: [place] Location provider:work or home office Persons participating in the virtual visit: patient, provider  I discussed the limitations and requested verbal permission for telemedicine visit. The patient expressed understanding and agreed to proceed.   HPI:  Acute telemedicine visit for sinus issues: -Onset: 2 days, has allergies at baseline -Symptoms include:nasal congestion, had a streak of blood in the mucus yesterday, temp 100 last night, loss of taste, cough -Denies:CP, SOB, NVD, wheezing -Has tried:coricidin -Pertinent past medical history: see below, had covid in the past, FDLMP 8/20 -Pertinent medication allergies: Allergies  Allergen Reactions   Latex Itching   Lidocaine     Rash and irritation at patch application site  -[id number] vaccine status:  Immunization History  Administered Date(s) Administered   Influenza,inj,Quad PF,6+ Mos 08/12/2021, 08/15/2022   Influenza-Unspecified 08/11/2017, 08/11/2018   PFIZER(Purple Top)SARS-COV-2 Vaccination 02/27/2020, 03/12/2020   Tdap 08/12/2021     ROS: See pertinent positives and negatives per HPI.  Past Medical History:  Diagnosis Date   Hypertension    IBS (irritable bowel syndrome) 04/13/2014   Sickle cell trait ([place])    Vertigo     No past surgical history on file.   Current Outpatient Medications:    albuterol (PROAIR HFA) 108 (90 Base) MCG/ACT inhaler, Inhale 2 puffs into the lungs every 6 (six) hours as needed for wheezing or shortness of breath., Disp: 18 g, Rfl: 2   amLODipine (NORVASC) 2.5 MG tablet, TAKE 1 TABLET BY MOUTH DAILY. PATIENT NEEDS AN APPOINTMENT, Disp: 90 tablet, Rfl: 3   benzonatate (TESSALON PERLES) 100 MG capsule, 1-2 capsules up to twice daily as needed for cough, Disp:  20 capsule, Rfl: 0   chlorhexidine (PERIDEX) 0.12 % solution, Use as directed 2 times a day for 1 minute, Disp: 473 mL, Rfl: 0   Cholecalciferol (VITAMIN D3) 50 MCG (2000 UT) capsule, Take 1 capsule (2,000 Units total) by mouth daily., Disp: 30 capsule, Rfl: 5   fluticasone (FLONASE) 50 MCG/ACT nasal spray, USE 1 SPRAY INTO BOTH NOSTRILS DAILY., Disp: 16 g, Rfl: 5   ibuprofen (ADVIL) 800 MG tablet, Take 1 tablet (800 mg total) by mouth every 8 (eight) hours as needed for up to 21 doses for fever, headache, mild pain or moderate pain., Disp: 21 tablet, Rfl: 0   methocarbamol (ROBAXIN) 500 MG tablet, Take 1 tablet (500 mg total) by mouth 2 (two) times daily., Disp: 20 tablet, Rfl: 0   omeprazole (PRILOSEC) 20 MG capsule, Take 1 capsule (20 mg total) by mouth daily., Disp: 90 capsule, Rfl: 1   Prenatal Vit-Fe Fumarate-FA (PRENATAL MULTIVITAMIN) TABS tablet, Take 1 tablet by mouth daily at 12 noon., Disp: , Rfl:    montelukast (SINGULAIR) 10 MG tablet, Take 1 tablet (10 mg total) by mouth at bedtime., Disp: 90 tablet, Rfl: 1  EXAM:  VITALS per patient if applicable:  GENERAL: alert, oriented, appears well and in no acute distress  HEENT: atraumatic, conjunttiva clear, no obvious abnormalities on inspection of external nose and ears  NECK: normal movements of the head and neck  LUNGS: on inspection no signs of respiratory distress, breathing rate appears normal, no obvious gross SOB, gasping or wheezing  CV: no obvious cyanosis  MS: moves all visible extremities without noticeable abnormality  PSYCH/NEURO: pleasant and cooperative,  no obvious depression or anxiety, speech and thought processing grossly intact  ASSESSMENT AND PLAN:  Discussed the following assessment and plan:  Nasal congestion  Cough, unspecified type  -we discussed possible serious and likely etiologies, options for evaluation and workup, limitations of telemedicine visit vs in person visit, treatment, treatment risks  and precautions. Pt is agreeable to treatment via telemedicine at this moment. Query covid (seeing increased levels in community), [facility] vs other. She has opted to do home testing for covid and knows if wishes to do antiviral can contact a [facility] or follow up. In the interim nasal saline, rinses, Tessalon rx for cough, alb q 6 hours prn.  Advised to seek prompt virtual visit or in person care if worsening, new symptoms arise, or if is not improving with treatment as expected per our conversation of expected course. Discussed options for follow up care. Did let this patient know that I do telemedicine on Tuesdays and Thursdays for [place] and those are the days I am logged into the system. Advised to schedule follow up visit with PCP, [place] virtual visits or [facility] if any further questions or concerns to avoid delays in care.   I discussed the assessment and treatment plan with the patient. The patient was provided an opportunity to ask questions and all were answered. The patient agreed with the plan and demonstrated an understanding of the instructions.     [name], DO

## 2022-08-24 NOTE — Patient Instructions (Signed)
  HOME CARE TIPS:  -COVID19 testing information: [email or web address]  Most pharmacies also offer testing and home test kits. If the Covid19 test is positive and you desire antiviral treatment, please contact a [facility] or schedule a follow up virtual visit through your primary care office or through the [employer].  Other test to treat options: ConnectRV.is?[email or web address]  -I sent the medication(s) we discussed to your pharmacy: Meds ordered this encounter  Medications   benzonatate (TESSALON PERLES) 100 MG capsule    Sig: 1-2 capsules up to twice daily as needed for cough    Dispense:  20 capsule    Refill:  0    -albuterol 2 puffs every 6 hours if needed  -nasal saline sinus rinses twice daily  -stay hydrated, drink plenty of fluids and eat small healthy meals - avoid dairy  -can take 1000 IU (45mg) Vit D3 and 100-500 mg of Vit C daily per instructions  -follow up with your doctor in 2-3 days unless improving and feeling better  -stay home while sick, except to seek medical care. If you have COVID19, you will likely be contagious for 7-10 days. Flu or Influenza is likely contagious for about 7 days. Other respiratory viral infections remain contagious for 5-10+ days depending on the virus and many other factors. Wear a good mask that fits snugly (such as N95 or KN95) if around others to reduce the risk of transmission.  It was nice to meet you today, and I really hope you are feeling better soon. I help [facility] out with telemedicine visits on Tuesdays and Thursdays and am happy to help if you need a follow up virtual visit on those days. Otherwise, if you have any concerns or questions following this visit please schedule a follow up visit with your Primary Care doctor or seek care at a local urgent care clinic to avoid delays in care.    Seek in person care or schedule a follow up video visit promptly if your  symptoms worsen, new concerns arise or you are not improving with treatment. Call 911 and/or seek emergency care if your symptoms are severe or life threatening.

## 2022-08-25 ENCOUNTER — Other Ambulatory Visit (HOSPITAL_COMMUNITY): Payer: Self-pay

## 2022-08-29 ENCOUNTER — Ambulatory Visit: Payer: 59 | Admitting: Obstetrics & Gynecology

## 2022-09-09 ENCOUNTER — Other Ambulatory Visit (HOSPITAL_COMMUNITY): Payer: Self-pay

## 2022-10-06 ENCOUNTER — Ambulatory Visit (INDEPENDENT_AMBULATORY_CARE_PROVIDER_SITE_OTHER): Payer: 59 | Admitting: Obstetrics & Gynecology

## 2022-10-06 ENCOUNTER — Encounter: Payer: Self-pay | Admitting: Obstetrics & Gynecology

## 2022-10-06 ENCOUNTER — Other Ambulatory Visit (HOSPITAL_COMMUNITY)
Admission: RE | Admit: 2022-10-06 | Discharge: 2022-10-06 | Disposition: A | Discharge status: Home / Self Care | Payer: 59 | Source: Ambulatory Visit | Attending: Obstetrics & Gynecology | Admitting: Obstetrics & Gynecology

## 2022-10-06 VITALS — BP 122/80 | Ht 59.0 in | Wt 156.0 lb

## 2022-10-06 DIAGNOSIS — Z01419 Encounter for gynecological examination (general) (routine) without abnormal findings: Secondary | ICD-10-CM

## 2022-10-06 DIAGNOSIS — Z3009 Encounter for other general counseling and advice on contraception: Secondary | ICD-10-CM | POA: Diagnosis not present

## 2022-10-06 DIAGNOSIS — Z113 Encounter for screening for infections with a predominantly sexual mode of transmission: Secondary | ICD-10-CM | POA: Diagnosis not present

## 2022-10-06 NOTE — Progress Notes (Signed)
[place] 1981/08/03 [id number]   History:    41 y.o.  G3P1A2L0  Stable boyfriend   RP:  Established patient presenting for annual gyn exam    HPI:  Menses normal and regular every month.  No BTB.  No pelvic pain.  Attempting conception.  Known small asymptomatic uterine fibroids.  H/O Cervical incompetence with 2nd trimester loss. Cerclage done then with next pregnancy, but had a miscarriage.  Pap Neg 02/2022.  ASCUS/HPV HR pos, colpo 03/2021 No Dysplasia.  HPV 16-18-45 negative in 2019.  No pain with IC.  Breasts normal. Needs to schedule mammo.  BMI 31.51. Lower calorie/carb, increase fitness activities.  [facility] labs.   Past medical history,surgical history, family history and social history were all reviewed and documented in the EPIC chart.  Gynecologic History No LMP recorded.  Obstetric History OB History  Gravida Para Term Preterm AB Living  3 1 0 1 2 0  SAB IAB Ectopic Multiple Live Births  2            # Outcome Date GA Lbr Len/2nd Weight Sex Delivery Anes PTL Lv  3 Preterm 2010 [redacted]w[redacted]d  M Vag-Spont        Birth Comments: cerclage, placenta previa  2 SAB 2003 241w0d F      1 SAB 2001 1692w0dM         ROS: A ROS was performed and pertinent positives and negatives are included in the history. GENERAL: No fevers or chills. HEENT: No change in vision, no earache, sore throat or sinus congestion. NECK: No pain or stiffness. CARDIOVASCULAR: No chest pain or pressure. No palpitations. PULMONARY: No shortness of breath, cough or wheeze. GASTROINTESTINAL: No abdominal pain, nausea, vomiting or diarrhea, melena or bright red blood per rectum. GENITOURINARY: No urinary frequency, urgency, hesitancy or dysuria. MUSCULOSKELETAL: No joint or muscle pain, no back pain, no recent trauma. DERMATOLOGIC: No rash, no itching, no lesions. ENDOCRINE: No polyuria, polydipsia, no heat or cold intolerance. No recent change in weight. HEMATOLOGICAL: No anemia or easy bruising or  bleeding. NEUROLOGIC: No headache, seizures, numbness, tingling or weakness. PSYCHIATRIC: No depression, no loss of interest in normal activity or change in sleep pattern.     Exam:   BP 122/80 (BP Location: Right Arm, Patient Position: Sitting, Cuff Size: Normal)   Ht '4\' 11"'$  (1.499 m)   Wt 156 lb (70.8 kg)   BMI 31.51 kg/m   Body mass index is 31.51 kg/m.  General appearance : Well developed well nourished female. No acute distress HEENT: Eyes: no retinal hemorrhage or exudates,  Neck supple, trachea midline, no carotid bruits, no thyroidmegaly Lungs: Clear to auscultation, no rhonchi or wheezes, or rib retractions  Heart: Regular rate and rhythm, no murmurs or gallops Breast:Examined in sitting and supine position were symmetrical in appearance, no palpable masses or tenderness,  no skin retraction, no nipple inversion, no nipple discharge, no skin discoloration, no axillary or supraclavicular lymphadenopathy Abdomen: no palpable masses or tenderness, no rebound or guarding Extremities: no edema or skin discoloration or tenderness  Pelvic: Vulva: Normal             Vagina: No gross lesions or discharge  Cervix: No gross lesions or discharge  Uterus  AV, normal size, shape and consistency, non-tender and mobile  Adnexa  Without masses or tenderness  Anus: Normal   Assessment/Plan:  [age]o. female for annual exam   1. Encounter for routine  gynecological examination with Papanicolaou smear of cervix Menses normal and regular every month.  No BTB.  No pelvic pain.  Attempting conception.  Known small asymptomatic uterine fibroids.  H/O Cervical incompetence with 2nd trimester loss. Cerclage done then with next pregnancy, but had a miscarriage.  Pap Neg 02/2022.  ASCUS/HPV HR pos, colpo 03/2021 No Dysplasia.  HPV 16-18-45 negative in 2019.  No pain with IC.  Breasts normal. Needs to schedule mammo.  BMI 31.51. Lower calorie/carb, increase fitness activities.  [facility] labs. -  Cytology - PAP( [place])  2. Screen for STD (sexually transmitted disease) - HIV antibody (with reflex) - RPR - Hepatitis B Surface AntiGEN - Hepatitis C Antibody - Cytology - PAP( [place]) with Gono-Chlam  3. Encounter for other general counseling or advice on contraception  Declines contraception, would like to conceive.  Princess Bruins MD, 11:36 AM 10/06/2022

## 2022-10-09 LAB — HEPATITIS B SURFACE ANTIGEN: Hepatitis B Surface Ag: NONREACTIVE

## 2022-10-09 LAB — HEPATITIS C ANTIBODY: Hepatitis C Ab: NONREACTIVE

## 2022-10-09 LAB — HIV ANTIBODY (ROUTINE TESTING W REFLEX): HIV 1&2 Ab, 4th Generation: NONREACTIVE

## 2022-10-09 LAB — RPR: RPR Ser Ql: NONREACTIVE

## 2022-10-11 LAB — CYTOLOGY - PAP
Chlamydia: NEGATIVE
Comment: NEGATIVE
Comment: NORMAL
Neisseria Gonorrhea: NEGATIVE

## 2022-10-13 ENCOUNTER — Other Ambulatory Visit: Payer: Self-pay | Admitting: *Deleted

## 2022-10-13 DIAGNOSIS — R87612 Low grade squamous intraepithelial lesion on cytologic smear of cervix (LGSIL): Secondary | ICD-10-CM

## 2022-10-18 ENCOUNTER — Other Ambulatory Visit (HOSPITAL_COMMUNITY): Payer: Self-pay

## 2022-10-19 ENCOUNTER — Other Ambulatory Visit (HOSPITAL_COMMUNITY): Payer: Self-pay

## 2022-10-25 ENCOUNTER — Encounter: Payer: Self-pay | Admitting: Obstetrics & Gynecology

## 2022-10-30 ENCOUNTER — Telehealth: Payer: Self-pay | Admitting: Obstetrics & Gynecology

## 2022-10-30 NOTE — Telephone Encounter (Signed)
Dr Dellis Filbert placed order for patient to schedule colposcopy. Message was left on November 3 and 10 and letter mailed on November 15 for patient to call and schedule appointment;patient has not scheduled.

## 2022-11-20 NOTE — Telephone Encounter (Signed)
Spoke with patient.  Reviewed 10/06/22 PAP results and recommendations per Dr. Dellis Filbert.  Patient has questions about OOP cost prior to scheduling. Advised I will forward message to business office for return call. Patient agreeable.    Routing to [place] for return call.

## 2022-11-21 NOTE — Telephone Encounter (Signed)
[name]  You1 hour ago (2:36 PM)   DM Appt is scheduled for 12/22/2022 @ 3:30 with Dr Marguerita Merles.    Routing to provider for final review. Patient is agreeable to disposition. Will close encounter.

## 2022-11-24 ENCOUNTER — Ambulatory Visit: Payer: 59 | Admitting: Family Medicine

## 2022-11-24 ENCOUNTER — Other Ambulatory Visit (HOSPITAL_COMMUNITY): Payer: Self-pay

## 2022-11-24 ENCOUNTER — Encounter: Payer: Self-pay | Admitting: Family Medicine

## 2022-11-24 VITALS — BP 130/86 | HR 95 | Temp 99.4°F | Wt 152.6 lb

## 2022-11-24 DIAGNOSIS — J101 Influenza due to other identified influenza virus with other respiratory manifestations: Secondary | ICD-10-CM

## 2022-11-24 DIAGNOSIS — R509 Fever, unspecified: Secondary | ICD-10-CM | POA: Diagnosis not present

## 2022-11-24 DIAGNOSIS — R059 Cough, unspecified: Secondary | ICD-10-CM | POA: Diagnosis not present

## 2022-11-24 LAB — POCT INFLUENZA A/B
Influenza A, POC: POSITIVE — AB
Influenza B, POC: NEGATIVE

## 2022-11-24 LAB — POC COVID19 BINAXNOW: SARS Coronavirus 2 Ag: NEGATIVE

## 2022-11-24 MED ORDER — OSELTAMIVIR PHOSPHATE 75 MG PO CAPS
75.0000 mg | ORAL_CAPSULE | Freq: Two times a day (BID) | ORAL | 0 refills | Status: AC
  Filled 2022-11-24: qty 10, 5d supply, fill #0

## 2022-11-24 NOTE — Progress Notes (Signed)
   Acute Office Visit  Subjective:     Patient ID: [name], female    DOB: 1981-11-11, 41 y.o.   MRN: [id number]  Chief Complaint  Patient presents with   Fever    Pt c/o sx of weakness, fever(101),  cough- yellow/green phlegm, nasal congestion, sneezing, wheezing, sob when using stairs. Sx started yestterday. Taking Coricidin HBP, Ibuprofen, and Albuterol inhaler.   Patient is accompanied by her mother.  HPI Patient is in today for acute concern.  Patient endorses developing cough, sneezing, wheezing, fever Tmax 101 F, and sore throat starting 1 day ago.  Symptoms have continued but sore throat has improved.  Patient taking ibuprofen and using albuterol inhaler.  Patient sent home from work 2/2 symptoms.  Patient endorses family members sick contacts.  ROS General: Denies fever, chills, night sweats, changes in weight, changes in appetite +fatigue HEENT: Denies headaches, ear pain, changes in vision, rhinorrhea, sore throat +sneezing, sore throat-improved. CV: Denies CP, palpitations, SOB, orthopnea  Pulm: Denies SOB +cough, wheezing GI: Denies abdominal pain, nausea, vomiting, diarrhea, constipation GU: Denies dysuria, hematuria, frequency, vaginal discharge Msk: Denies muscle cramps, joint pains Neuro: Denies weakness, numbness, tingling Skin: Denies rashes, bruising Psych: Denies depression, anxiety, hallucinations     Objective:    BP 130/86 (BP Location: Left Arm, Patient Position: Sitting, Cuff Size: Normal)   Pulse 95   Temp 99.4 F (37.4 C) (Oral)   Wt 152 lb 9.6 oz (69.2 kg)   SpO2 98%   BMI 30.82 kg/m    Physical Exam Gen. Pleasant, well developed, well-nourished, in NAD HEENT - [place]/AT, PERRL, EOMI, conjunctive clear, no scleral icterus, no nasal drainage, pharynx without erythema or exudate.  TMs full bilaterally. Lungs: no use of accessory muscles, CTAB, no wheezes, rales or rhonchi Cardiovascular: RRR,  No r/g/m, no peripheral edema Abdomen: BS  present, soft, nontender,nondistended, no hepatosplenomegaly Musculoskeletal: No deformities, moves all four extremities, no cyanosis or clubbing, normal tone Neuro:  A&Ox3, CN II-XII intact, normal gait Skin:  Warm, dry, intact, no lesions   No results found for any visits on 11/24/22.      Assessment & Plan:   Problem List Items Addressed This Visit       Other   Cough - Primary   Relevant Orders   POC COVID-19   POC Influenza A/B   Other Visit Diagnoses     Fever, unspecified fever cause       Relevant Orders   POC COVID-19   POC Influenza A/B       Meds ordered this encounter  Medications   oseltamivir (TAMIFLU) 75 MG capsule    Sig: Take 1 capsule (75 mg total) by mouth 2 (two) times daily for 5 days.    Dispense:  10 capsule    Refill:  0   Influenza A testing positive.  Discussed r/b/a of antiviral medication.  Will start Tamiflu.  Continue supportive care.  Given precautions.  COVID and influenza B testing negative.  Given note for work.   F/u prn for continued or worsening symptoms  Billie Ruddy, MD

## 2022-11-24 NOTE — Addendum Note (Signed)
Addended byEncarnacion Slates on: 11/24/2022 04:05 PM   Modules accepted: Orders

## 2022-11-24 NOTE — Patient Instructions (Signed)
A prescription for Tamiflu was sent to the pharmacy.

## 2022-11-30 ENCOUNTER — Encounter: Payer: Self-pay | Admitting: Nurse Practitioner

## 2022-11-30 ENCOUNTER — Ambulatory Visit: Payer: 59 | Admitting: Nurse Practitioner

## 2022-11-30 VITALS — BP 110/70 | Temp 97.9°F | Resp 16

## 2022-11-30 DIAGNOSIS — R350 Frequency of micturition: Secondary | ICD-10-CM | POA: Diagnosis not present

## 2022-11-30 DIAGNOSIS — N939 Abnormal uterine and vaginal bleeding, unspecified: Secondary | ICD-10-CM | POA: Diagnosis not present

## 2022-11-30 DIAGNOSIS — N898 Other specified noninflammatory disorders of vagina: Secondary | ICD-10-CM

## 2022-11-30 LAB — URINALYSIS, COMPLETE W/RFL CULTURE
Bacteria, UA: NONE SEEN /HPF
Bilirubin Urine: NEGATIVE
Casts: NONE SEEN /LPF
Crystals: NONE SEEN /HPF
Glucose, UA: NEGATIVE
Hgb urine dipstick: NEGATIVE
Hyaline Cast: NONE SEEN /LPF
Leukocyte Esterase: NEGATIVE
Nitrites, Initial: NEGATIVE
Protein, ur: NEGATIVE
RBC / HPF: NONE SEEN /HPF (ref 0–2)
Specific Gravity, Urine: 1.025 (ref 1.001–1.035)
WBC, UA: NONE SEEN /HPF (ref 0–5)
Yeast: NONE SEEN /HPF
pH: 7 (ref 5.0–8.0)

## 2022-11-30 LAB — PREGNANCY, URINE: Preg Test, Ur: NEGATIVE

## 2022-11-30 LAB — WET PREP FOR TRICH, YEAST, CLUE

## 2022-11-30 LAB — NO CULTURE INDICATED

## 2022-11-30 NOTE — Progress Notes (Signed)
   Acute Office Visit  Subjective:    Patient ID: [name], female    DOB: [birth year], 41 y.o.   MRN: [id number]   HPI 41 y.o. presents today for urinary frequency and back pain for a few days. Denies dysuria, urgency, hematuria, vaginal odor or itching. Has had brown spotting. She gets this around her menses but says she is not due for a couple of weeks. Negative STD screening in October, declines now . + flu 12/15. Finished course of Tamiflu 2 days ago. Would like UPT. Cycle was longer than normal this month and she had cycles with a previous pregnancy.    Review of Systems  Constitutional: Negative.   Genitourinary:  Positive for frequency and vaginal discharge. Negative for dysuria, flank pain, hematuria, urgency and vaginal pain.  Musculoskeletal:  Positive for back pain.       Objective:    Physical Exam Constitutional:      Appearance: Normal appearance.  Genitourinary:    General: Normal vulva.     Vagina: Vaginal discharge (light, brown) present. No erythema.     Cervix: Normal.     BP 110/70   Temp 97.9 F (36.6 C) (Oral)   Resp 16   LMP 11/15/2022 (Exact Date)  Wt Readings from Last 3 Encounters:  11/24/22 152 lb 9.6 oz (69.2 kg)  10/06/22 156 lb (70.8 kg)  08/24/22 152 lb (68.9 kg)        Patient informed chaperone available to be present for breast and/or pelvic exam. Patient has requested no chaperone to be present. Patient has been advised what will be completed during breast and pelvic exam.   UA negative Wet prep negative UPT negative  Assessment & Plan:   Problem List Items Addressed This Visit   None Visit Diagnoses     Urinary frequency    -  Primary   Relevant Orders   Urinalysis,Complete w/RFL Culture (Completed)   Vaginal discharge       Relevant Orders   WET PREP FOR [employer], YEAST, CLUE (Completed)   Vaginal spotting       Relevant Orders   Pregnancy, urine      Plan: Negative exam, wet prep, UA, and UPT today.  Back pain may be from coughing with flu this past week. Will continue to monitor.      [place], 4:30 PM 11/30/2022

## 2022-12-22 ENCOUNTER — Other Ambulatory Visit (HOSPITAL_COMMUNITY)
Admission: RE | Admit: 2022-12-22 | Discharge: 2022-12-22 | Disposition: A | Discharge status: Home / Self Care | Payer: Commercial Managed Care - PPO | Source: Ambulatory Visit

## 2022-12-22 ENCOUNTER — Encounter: Payer: Self-pay | Admitting: Obstetrics & Gynecology

## 2022-12-22 ENCOUNTER — Other Ambulatory Visit (HOSPITAL_COMMUNITY)
Admission: RE | Admit: 2022-12-22 | Discharge: 2022-12-22 | Disposition: A | Discharge status: Home / Self Care | Payer: Commercial Managed Care - PPO | Source: Ambulatory Visit | Attending: Obstetrics & Gynecology | Admitting: Obstetrics & Gynecology

## 2022-12-22 ENCOUNTER — Ambulatory Visit: Payer: Commercial Managed Care - PPO | Admitting: Obstetrics & Gynecology

## 2022-12-22 VITALS — BP 120/78 | HR 86

## 2022-12-22 DIAGNOSIS — R87612 Low grade squamous intraepithelial lesion on cytologic smear of cervix (LGSIL): Secondary | ICD-10-CM | POA: Insufficient documentation

## 2022-12-22 DIAGNOSIS — N870 Mild cervical dysplasia: Secondary | ICD-10-CM | POA: Diagnosis not present

## 2022-12-22 DIAGNOSIS — N72 Inflammatory disease of cervix uteri: Secondary | ICD-10-CM

## 2022-12-22 DIAGNOSIS — Z01812 Encounter for preprocedural laboratory examination: Secondary | ICD-10-CM

## 2022-12-22 DIAGNOSIS — B977 Papillomavirus as the cause of diseases classified elsewhere: Secondary | ICD-10-CM

## 2022-12-22 DIAGNOSIS — N979 Female infertility, unspecified: Secondary | ICD-10-CM | POA: Diagnosis not present

## 2022-12-22 LAB — PREGNANCY, URINE: Preg Test, Ur: NEGATIVE

## 2022-12-22 NOTE — Progress Notes (Signed)
    [name] [name] May 03, 1981 [id number]        42 y.o.  G3P0120 Back with first boyfriend  RP: LGSIL 10/06/2022 for Colpo  HPI: LGSIL 10/06/2022.  Gono-Chlam Neg 10/06/22.  Koilocytotic atypia on Colpo 03/2021.  HPV HR Pos in 08/2020.  3 Pregnancy losses, now 42 yo and would like to conceive.   OB History  Gravida Para Term Preterm AB Living  3 1 0 1 2 0  SAB IAB Ectopic Multiple Live Births  2            # Outcome Date GA Lbr Len/2nd Weight Sex Delivery Anes PTL Lv  3 Preterm 2010 [redacted]w[redacted]d  M Vag-Spont        Birth Comments: cerclage, placenta previa  2 SAB 2003 220w0d F      1 SAB 2001 1638w0dM        Obstetric Comments  All 3 babies were stillborn    Past medical history,surgical history, problem list, medications, allergies, family history and social history were all reviewed and documented in the EPIC chart.   Directed ROS with pertinent positives and negatives documented in the history of present illness/assessment and plan.  Exam:  Vitals:   12/22/22 1520  BP: 120/78  Pulse: 86  SpO2: 99%   General appearance:  Normal  Colposcopy Procedure Note [name]  Indications: LGSIL Pap 09/2022  Procedure Details  The risks and benefits of the procedure and Written informed consent obtained.  Speculum placed in vagina and excellent visualization of cervix achieved, cervix swabbed x 3 with acetic acid solution.  Findings:  Cervix colposcopy: Physical Exam Genitourinary:        Vaginal colposcopy: Normal  Vulvar colposcopy: Normal  Perirectal colposcopy: Normal  The cervix was sprayed with Hurricane before performing the cervical biopsies.  Specimens: HPV 16-18-45.  Cervical Bx at 11 O'Clock.  Complications: None, good hemostasis with Silver Nitrate. . Plan:  Management per results  UPT Negative   Assessment/Plan:  [age]o. G3P0120   1. LGSIL on Pap smear of cervix LGSIL 10/06/2022.  Gono-Chlam Neg 10/06/22.   Koilocytotic atypia on Colpo 03/2021.  HPV HR Pos in 08/2020.  3 Pregnancy losses, now [age] and would like to conceive.  Colposcopy findings reviewed.  Post procedure precautions.  Management per results. - Colposcopy - Cervicovaginal ancillary only( [facility]) - Surgical pathology( [facility]/ [facility])  2. High risk human papilloma virus (HPV) infection of cervix - Cervicovaginal ancillary only( [facility]) - Surgical pathology( [facility]/ [facility])  3. Encounter for preprocedural laboratory examination UPT Neg - Pregnancy, urine  4. Infertility, female  3 Pregnancy losses, now [age] and would like to conceive. Recommended adoption, patient is not interested.  Refer to Fertility specialist.  [name], 3:40 PM 12/22/2022

## 2022-12-25 ENCOUNTER — Telehealth: Payer: Self-pay

## 2022-12-25 ENCOUNTER — Other Ambulatory Visit: Payer: Self-pay

## 2022-12-25 DIAGNOSIS — N96 Recurrent pregnancy loss: Secondary | ICD-10-CM

## 2022-12-25 DIAGNOSIS — Z3169 Encounter for other general counseling and advice on procreation: Secondary | ICD-10-CM

## 2022-12-25 NOTE — Telephone Encounter (Signed)
Referral order placed in Epic.

## 2022-12-25 NOTE — Telephone Encounter (Signed)
Refer to Fertility Received: 3 days ago Princess Bruins, [facility] Triage; Ramond Craver, RMA 3 Pregnancy losses, now 42 yo and would like to conceive. Recommended adoption, patient is not interested.  Refer to Fertility specialist.

## 2022-12-28 LAB — CERVICOVAGINAL ANCILLARY ONLY
Comment: NEGATIVE
High risk HPV: NEGATIVE

## 2022-12-29 LAB — SURGICAL PATHOLOGY

## 2023-01-19 ENCOUNTER — Other Ambulatory Visit (HOSPITAL_COMMUNITY): Payer: Self-pay

## 2023-02-13 ENCOUNTER — Other Ambulatory Visit (HOSPITAL_COMMUNITY): Payer: Self-pay

## 2023-02-13 ENCOUNTER — Encounter: Payer: Self-pay | Admitting: Family Medicine

## 2023-02-13 ENCOUNTER — Ambulatory Visit: Payer: Commercial Managed Care - PPO | Admitting: Family Medicine

## 2023-02-13 VITALS — BP 110/80 | HR 60 | Temp 98.1°F | Ht 59.0 in | Wt 148.9 lb

## 2023-02-13 DIAGNOSIS — J309 Allergic rhinitis, unspecified: Secondary | ICD-10-CM | POA: Diagnosis not present

## 2023-02-13 DIAGNOSIS — E559 Vitamin D deficiency, unspecified: Secondary | ICD-10-CM

## 2023-02-13 DIAGNOSIS — J4520 Mild intermittent asthma, uncomplicated: Secondary | ICD-10-CM

## 2023-02-13 DIAGNOSIS — I10 Essential (primary) hypertension: Secondary | ICD-10-CM | POA: Diagnosis not present

## 2023-02-13 LAB — VITAMIN D 25 HYDROXY (VIT D DEFICIENCY, FRACTURES): VITD: 48.87 ng/mL (ref 30.00–100.00)

## 2023-02-13 LAB — COMPREHENSIVE METABOLIC PANEL
ALT: 9 U/L (ref 0–35)
AST: 14 U/L (ref 0–37)
Albumin: 4 g/dL (ref 3.5–5.2)
Alkaline Phosphatase: 61 U/L (ref 39–117)
BUN: 11 mg/dL (ref 6–23)
CO2: 28 mEq/L (ref 19–32)
Calcium: 9.5 mg/dL (ref 8.4–10.5)
Chloride: 101 mEq/L (ref 96–112)
Creatinine, Ser: 0.92 mg/dL (ref 0.40–1.20)
GFR: 77.42 mL/min (ref >60.00)
Glucose, Bld: 79 mg/dL (ref 70–99)
Potassium: 4.1 mEq/L (ref 3.5–5.1)
Sodium: 136 mEq/L (ref 135–145)
Total Bilirubin: 0.4 mg/dL (ref 0.2–1.2)
Total Protein: 7.6 g/dL (ref 6.0–8.3)

## 2023-02-13 MED ORDER — MONTELUKAST SODIUM 10 MG PO TABS
10.0000 mg | ORAL_TABLET | Freq: Every day | ORAL | 1 refills | Status: DC
  Filled 2023-02-13: qty 90, 90d supply, fill #0

## 2023-02-13 MED ORDER — ALBUTEROL SULFATE HFA 108 (90 BASE) MCG/ACT IN AERS
2.0000 | INHALATION_SPRAY | Freq: Four times a day (QID) | RESPIRATORY_TRACT | 2 refills | Status: AC | PRN
  Filled 2023-02-13: qty 6.7, 25d supply, fill #0

## 2023-02-13 MED ORDER — FLUTICASONE PROPIONATE 50 MCG/ACT NA SUSP
1.0000 | Freq: Every day | NASAL | 5 refills | Status: AC
  Filled 2023-02-13: qty 16, 60d supply, fill #0

## 2023-02-13 NOTE — Assessment & Plan Note (Signed)
Needs new level today, then she may reduce the supplements if her level is in the normal range

## 2023-02-13 NOTE — Progress Notes (Signed)
Established Patient Office Visit  Subjective   Patient ID: [name], female    DOB: Feb 28, 1981  Age: 42 y.o. MRN: [id number]  Chief Complaint  Patient presents with   Medical Management of Chronic Issues    Pt is here for 6 month follow up today. She reports she is doing well, taking her medications as prescribed.   HTN -- BP in office performed and is well controlled. She  reports no side effects to the medications, no chest pain, SOB, dizziness or headaches. She has a BP cuff at home and is checking BP regularly, reports they are in the normal range.   Vitamin D def-- has been using her daily supplements, she is due for a repeat lab today to check her levels.   AR-- we reviewed her medications, she needs refills on her montelukast and flonase spray. She also reports that her albuterol inhaler is expired and needs refills of this as well.    Current Outpatient Medications  Medication Instructions   albuterol (PROAIR HFA) 108 (90 Base) MCG/ACT inhaler 2 puffs, Inhalation, Every 6 hours PRN   amLODipine (NORVASC) 2.5 MG tablet TAKE 1 TABLET BY MOUTH DAILY. PATIENT NEEDS AN APPOINTMENT   fluticasone (FLONASE) 50 MCG/ACT nasal spray USE 1 SPRAY INTO BOTH NOSTRILS DAILY.   ibuprofen (ADVIL) 800 mg, Oral, Every 8 hours PRN   methocarbamol (ROBAXIN) 500 mg, Oral, 2 times daily   montelukast (SINGULAIR) 10 mg, Oral, Daily at bedtime   omeprazole (PRILOSEC) 20 mg, Oral, Daily   Prenatal Vit-Fe Fumarate-FA (PRENATAL MULTIVITAMIN) TABS tablet 1 tablet, Daily   Vitamin D3 2,000 Units, Oral, Daily    Patient Active Problem List   Diagnosis Date Noted   Allergic rhinitis 02/13/2023   Vertigo, peripheral, left 07/06/2022   Vitamin D deficiency 07/06/2022   Hypertension 02/17/2022   Lactose intolerance 06/27/2019   GERD (gastroesophageal reflux disease) 06/27/2019   Environmental allergies 11/15/2018   Bronchitis 11/15/2018   Wheezing 11/15/2018   Cough 11/15/2018    Chronic pain of right ankle 08/06/2017   Localized osteoarthritis of right ankle 08/06/2017   Syncope 11/25/2014   Leiomyoma of uterus 04/13/2014   Amenorrhea due to Depo Provera 04/13/2014      Review of Systems  All other systems reviewed and are negative.     Objective:     BP 110/80 (BP Location: Left Arm, Patient Position: Sitting, Cuff Size: Normal)   Pulse 60   Temp 98.1 F (36.7 C) (Oral)   Ht '4\' 11"'$  (1.499 m)   Wt 148 lb 14.4 oz (67.5 kg)   LMP 01/29/2023 (Approximate)   SpO2 99%   BMI 30.07 kg/m    Physical Exam Vitals reviewed.  Constitutional:      Appearance: Normal appearance. She is well-groomed and normal weight.  Eyes:     Conjunctiva/sclera: Conjunctivae normal.  Neck:     Thyroid: No thyromegaly.  Cardiovascular:     Rate and Rhythm: Normal rate and regular rhythm.     Pulses: Normal pulses.     Heart sounds: S1 normal and S2 normal.  Pulmonary:     Effort: Pulmonary effort is normal.     Breath sounds: Normal breath sounds and air entry.  Abdominal:     General: Bowel sounds are normal.  Musculoskeletal:     Right lower leg: No edema.     Left lower leg: No edema.  Neurological:     Mental Status: She is alert and oriented to person,  place, and time. Mental status is at baseline.     Gait: Gait is intact.  Psychiatric:        Mood and Affect: Mood and affect normal.        Speech: Speech normal.        Behavior: Behavior normal.        Judgment: Judgment normal.      No results found for any visits on 02/13/23.    The 10-year ASCVD risk score (Arnett DK, et al., 2019) is: 0.6%    Assessment & Plan:   Problem List Items Addressed This Visit       Unprioritized   Hypertension    Well controlled, on 2.5 mg amlodipine daily. Continue this medication as prescribed      Relevant Orders   CMP   Vitamin D deficiency - Primary (Chronic)    Needs new level today, then she may reduce the supplements if her level is in the normal  range      Relevant Orders   Vitamin D, 25-hydroxy   Allergic rhinitis    Refilled montelukast 10 mg daily, flonase 1 spray per nostril daily, and albuterol inhaler 90 mcg 2 puffs every 6 hours PRN cough.       Relevant Medications   montelukast (SINGULAIR) 10 MG tablet   fluticasone (FLONASE) 50 MCG/ACT nasal spray   Other Visit Diagnoses     Mild intermittent extrinsic asthma without complication       Relevant Medications   montelukast (SINGULAIR) 10 MG tablet   albuterol (PROAIR HFA) 108 (90 Base) MCG/ACT inhaler       Return in about 6 months (around 08/16/2023) for annual physical exam.    Farrel Conners, MD

## 2023-02-13 NOTE — Assessment & Plan Note (Signed)
Well controlled, on 2.5 mg amlodipine daily. Continue this medication as prescribed

## 2023-02-13 NOTE — Assessment & Plan Note (Signed)
Refilled montelukast 10 mg daily, flonase 1 spray per nostril daily, and albuterol inhaler 90 mcg 2 puffs every 6 hours PRN cough.

## 2023-04-10 ENCOUNTER — Other Ambulatory Visit (HOSPITAL_COMMUNITY): Payer: Self-pay

## 2023-05-02 ENCOUNTER — Ambulatory Visit: Payer: Commercial Managed Care - PPO | Admitting: Obstetrics & Gynecology

## 2023-05-02 ENCOUNTER — Encounter: Payer: Self-pay | Admitting: Obstetrics & Gynecology

## 2023-05-02 VITALS — BP 106/68 | HR 89

## 2023-05-02 DIAGNOSIS — N898 Other specified noninflammatory disorders of vagina: Secondary | ICD-10-CM

## 2023-05-02 DIAGNOSIS — N760 Acute vaginitis: Secondary | ICD-10-CM

## 2023-05-02 LAB — WET PREP FOR TRICH, YEAST, CLUE

## 2023-05-02 MED ORDER — TINIDAZOLE 500 MG PO TABS
1000.0000 mg | ORAL_TABLET | Freq: Two times a day (BID) | ORAL | 0 refills | Status: AC
  Filled 2023-05-02: qty 8, 2d supply, fill #0

## 2023-05-02 NOTE — Progress Notes (Signed)
    [name] 1981/09/23 [id number]        42 y.o.  [id number] Long term boyfriend  RP: Vaginal irritation x 04/17/23  HPI: Vaginal irritation x 04/17/23.  Was sexually active that day.  LMP 04/24/23 normal.  Menses every month with normal flow.  No condom used.  No contraception.  Would like to conceive, referred to Fertility, appointment coming up.   OB History  Gravida Para Term Preterm AB Living  3 1 0 1 2 0  SAB IAB Ectopic Multiple Live Births  2            # Outcome Date GA Lbr Len/2nd Weight Sex Delivery Anes PTL Lv  3 Preterm 2010 [redacted]w[redacted]d   M Vag-Spont        Birth Comments: cerclage, placenta previa  2 SAB 2003 [redacted]w[redacted]d   F      1 SAB 2001 [redacted]w[redacted]d   M        Obstetric Comments  All 3 babies were stillborn    Past medical history,surgical history, problem list, medications, allergies, family history and social history were all reviewed and documented in the EPIC chart.   Directed ROS with pertinent positives and negatives documented in the history of present illness/assessment and plan.  Exam:  Vitals:   05/02/23 1623  BP: 106/68  Pulse: 89  SpO2: 99%   General appearance:  Normal  Abdomen: Normal  Gynecologic exam: Vulva normal.  Speculum:  Cervix/Vagina normal.  Increased vaginal discharge.  Wet prep done.  Bimanual exam: Uterus AV, mobile, normal volume, NT.  No adnexal mass, NT bilaterally.  Wet prep: Clue cells present with odor   Assessment/Plan:  42 y.o. G3P0120   1. Vaginal irritation Vaginal irritation x 04/17/23.  Was sexually active that day.  LMP 04/24/23 normal.  Menses every month with normal flow.  No condom used.  No contraception.  Would like to conceive, referred to Fertility, appointment coming up.  Wet prep confirming Bacterial Vaginosis.  Will treat with Tinidazole.  Usage reviewed.  Prescription sent to pharmacy.  Pending SureSwab Advanced Plus. - WET PREP FOR TRICH, YEAST, CLUE - SureSwab Advanced Vaginitis Plus,TMA  Other orders -  tinidazole (TINDAMAX) 500 MG tablet; Take 2 tablets (1,000 mg total) by mouth 2 (two) times daily for 2 days.   Genia Del MD, 4:44 PM 05/02/2023

## 2023-05-03 ENCOUNTER — Other Ambulatory Visit (HOSPITAL_COMMUNITY): Payer: Self-pay

## 2023-05-03 LAB — SURESWAB® ADVANCED VAGINITIS PLUS,TMA
C. trachomatis RNA, TMA: NOT DETECTED
CANDIDA SPECIES: DETECTED — AB
Candida glabrata: NOT DETECTED
N. gonorrhoeae RNA, TMA: NOT DETECTED
SURESWAB(R) ADV BACTERIAL VAGINOSIS(BV),TMA: POSITIVE — AB
TRICHOMONAS VAGINALIS (TV),TMA: NOT DETECTED

## 2023-05-04 ENCOUNTER — Other Ambulatory Visit: Payer: Self-pay | Admitting: *Deleted

## 2023-05-04 ENCOUNTER — Other Ambulatory Visit (HOSPITAL_COMMUNITY): Payer: Self-pay

## 2023-05-04 MED ORDER — FLUCONAZOLE 150 MG PO TABS
ORAL_TABLET | ORAL | 0 refills | Status: DC
  Filled 2023-05-04: qty 3, 9d supply, fill #0

## 2023-05-29 DIAGNOSIS — N96 Recurrent pregnancy loss: Secondary | ICD-10-CM | POA: Diagnosis not present

## 2023-05-29 DIAGNOSIS — N979 Female infertility, unspecified: Secondary | ICD-10-CM | POA: Diagnosis not present

## 2023-06-22 DIAGNOSIS — N96 Recurrent pregnancy loss: Secondary | ICD-10-CM | POA: Diagnosis not present

## 2023-06-22 DIAGNOSIS — Z31430 Encounter of female for testing for genetic disease carrier status for procreative management: Secondary | ICD-10-CM | POA: Diagnosis not present

## 2023-06-22 DIAGNOSIS — N979 Female infertility, unspecified: Secondary | ICD-10-CM | POA: Diagnosis not present

## 2023-06-22 DIAGNOSIS — Z319 Encounter for procreative management, unspecified: Secondary | ICD-10-CM | POA: Diagnosis not present

## 2023-06-25 ENCOUNTER — Ambulatory Visit (INDEPENDENT_AMBULATORY_CARE_PROVIDER_SITE_OTHER): Payer: Commercial Managed Care - PPO | Admitting: Obstetrics & Gynecology

## 2023-06-25 ENCOUNTER — Encounter: Payer: Self-pay | Admitting: Obstetrics & Gynecology

## 2023-06-25 ENCOUNTER — Other Ambulatory Visit (HOSPITAL_COMMUNITY)
Admission: RE | Admit: 2023-06-25 | Discharge: 2023-06-25 | Disposition: A | Discharge status: Home / Self Care | Payer: Commercial Managed Care - PPO | Source: Ambulatory Visit | Attending: Obstetrics & Gynecology | Admitting: Obstetrics & Gynecology

## 2023-06-25 VITALS — BP 114/72 | HR 77

## 2023-06-25 DIAGNOSIS — N870 Mild cervical dysplasia: Secondary | ICD-10-CM | POA: Diagnosis not present

## 2023-06-25 DIAGNOSIS — N979 Female infertility, unspecified: Secondary | ICD-10-CM

## 2023-06-25 NOTE — Progress Notes (Signed)
    [name] 29-Apr-1981 [id number]        42 y.o.  [id number]   RP: Repeat Pap test at 6 months  HPI: Pap 09/2022 LGSIL.  Colpo Mild Cervical Dysplasia in 12/2022. HPV HR Neg in 12/2022.   H/O HPV HR Pos in 2021.  HPV 16-18-45 Neg in 2019.    OB History  Gravida Para Term Preterm AB Living  3 1 0 1 2 0  SAB IAB Ectopic Multiple Live Births  2       0    # Outcome Date GA Lbr Len/2nd Weight Sex Type Anes PTL Lv  3 Preterm 2010 [redacted]w[redacted]d   M Vag-Spont        Birth Comments: cerclage, placenta previa  2 SAB 2003 [redacted]w[redacted]d   F      1 SAB 2001 [redacted]w[redacted]d   M        Obstetric Comments  All 3 babies were stillborn    Past medical history,surgical history, problem list, medications, allergies, family history and social history were all reviewed and documented in the EPIC chart.   Directed ROS with pertinent positives and negatives documented in the history of present illness/assessment and plan.  Exam:  There were no vitals filed for this visit. General appearance:  Normal  Gynecologic exam: Vulva normal.  Speculum:  Cervix/Vagina normal.  Pap/HPV HR done.  Normal secretions.  No blood.   Assessment/Plan:  42 y.o. G3P0120   1. Mild cervical dysplasia, histologically confirmed Pap 09/2022 LGSIL.  Colpo Mild Cervical Dysplasia in 12/2022. HPV HR Neg in 12/2022.   H/O HPV HR Pos in 2021.  HPV 16-18-45 Neg in 2019.  Pap/HPV HR today.  Management per results. - Cytology - PAP( [facility])  2. Infertility, female  Managed by Fertility specialist.  Genia Del MD, 11:32 AM 06/25/2023

## 2023-06-27 LAB — CYTOLOGY - PAP
Comment: NEGATIVE
Diagnosis: UNDETERMINED — AB
High risk HPV: POSITIVE — AB

## 2023-06-28 DIAGNOSIS — Z3141 Encounter for fertility testing: Secondary | ICD-10-CM | POA: Diagnosis not present

## 2023-06-28 DIAGNOSIS — N96 Recurrent pregnancy loss: Secondary | ICD-10-CM | POA: Diagnosis not present

## 2023-06-28 DIAGNOSIS — N979 Female infertility, unspecified: Secondary | ICD-10-CM | POA: Diagnosis not present

## 2023-07-16 ENCOUNTER — Other Ambulatory Visit (HOSPITAL_COMMUNITY): Payer: Self-pay

## 2023-07-16 ENCOUNTER — Other Ambulatory Visit: Payer: Self-pay | Admitting: Family Medicine

## 2023-07-16 DIAGNOSIS — I10 Essential (primary) hypertension: Secondary | ICD-10-CM

## 2023-07-16 DIAGNOSIS — N96 Recurrent pregnancy loss: Secondary | ICD-10-CM | POA: Diagnosis not present

## 2023-07-16 DIAGNOSIS — N979 Female infertility, unspecified: Secondary | ICD-10-CM | POA: Diagnosis not present

## 2023-07-16 MED ORDER — AMLODIPINE BESYLATE 2.5 MG PO TABS
ORAL_TABLET | ORAL | 0 refills | Status: DC
Start: 2023-07-16 — End: 2023-08-16
  Filled 2023-07-16: qty 90, 90d supply, fill #0

## 2023-07-25 ENCOUNTER — Other Ambulatory Visit (HOSPITAL_COMMUNITY): Payer: Self-pay

## 2023-07-25 ENCOUNTER — Encounter: Payer: Self-pay | Admitting: Family Medicine

## 2023-07-25 ENCOUNTER — Ambulatory Visit: Payer: Commercial Managed Care - PPO | Admitting: Family Medicine

## 2023-07-25 VITALS — BP 110/68 | HR 79 | Temp 98.5°F | Ht 59.0 in | Wt 160.4 lb

## 2023-07-25 DIAGNOSIS — H8113 Benign paroxysmal vertigo, bilateral: Secondary | ICD-10-CM | POA: Diagnosis not present

## 2023-07-25 MED ORDER — MECLIZINE HCL 25 MG PO TABS
50.0000 mg | ORAL_TABLET | Freq: Three times a day (TID) | ORAL | 0 refills | Status: AC | PRN
Start: 2023-07-25 — End: ?
  Filled 2023-07-25: qty 30, 5d supply, fill #0

## 2023-07-25 NOTE — Patient Instructions (Addendum)
A prescription for meclizine was sent to your pharmacy.  This is a medication to help with your dizziness.  Meclizine can often make you feel drowsy.  If this is the case you can try taking half a tab so that you can take during the day.  You can also try Nutribiotic ear drops with tea tree oil and grapefruit seed extract.  2fl oz is ~$6.99.  This can be found online or at some natural food stores.  They can use as directed to help with dizziness.

## 2023-07-25 NOTE — Progress Notes (Signed)
Established Patient Office Visit   Subjective  Patient ID: [name], female    DOB: [birth year]  Age: 42 y.o. MRN: [id number]  Chief Complaint  Patient presents with   Dizziness    Started back 5 days ago     Patient is a 42 year old female followed with Dr. Casimiro Needle and seen for acute concern.  Patient notices dizziness x 5 days, progressively worsening.  Patient notes history of vertigo.  States symptoms started on Saturday after trying to the left in bed.  Patient states when she woke up the next morning it felt like the room was spinning.  Has been able to manage but had her boyfriend drive her to work due to symptoms.  Dizziness    Past Medical History:  Diagnosis Date   Abnormal Pap smear of cervix    Hypertension    IBS (irritable bowel syndrome) 04/13/2014   Sickle cell trait (HCC)    Vertigo    Past Surgical History:  Procedure Laterality Date   COLPOSCOPY     Social History   Tobacco Use   Smoking status: Never   Smokeless tobacco: Never  Vaping Use   Vaping status: Never Used  Substance Use Topics   Alcohol use: No   Drug use: No   Family History  Problem Relation Age of Onset   Hypertension Mother    Diabetes Maternal Aunt    Hypertension Maternal Aunt    Allergies  Allergen Reactions   Latex Itching   Lidocaine     Rash and irritation at patch application site      Review of Systems  Neurological:  Positive for dizziness.   Negative unless stated above    Objective:     BP 110/68 (BP Location: Left Arm, Patient Position: Sitting, Cuff Size: Normal)   Pulse 79   Temp 98.5 F (36.9 C) (Oral)   Ht 4\' 11"  (1.499 m)   Wt 160 lb 6.4 oz (72.8 kg)   LMP 06/18/2023 (Exact Date) Comment: sexually active, no contraception  SpO2 99%   BMI 32.40 kg/m  BP Readings from Last 3 Encounters:  07/25/23 110/68  06/25/23 114/72  05/02/23 106/68   Wt Readings from Last 3 Encounters:  07/25/23 160 lb 6.4 oz (72.8 kg)  02/13/23 148 lb  14.4 oz (67.5 kg)  11/24/22 152 lb 9.6 oz (69.2 kg)      Physical Exam Constitutional:      General: She is not in acute distress.    Appearance: Normal appearance.  HENT:     Head: Normocephalic and atraumatic.     Right Ear: Hearing, tympanic membrane, ear canal and external ear normal.     Left Ear: Hearing, tympanic membrane, ear canal and external ear normal.     Ears:     Comments: Epley maneuver performed in clinic with mild improvement of symptoms.    Nose: Nose normal.     Mouth/Throat:     Mouth: Mucous membranes are moist.  Eyes:     Extraocular Movements: Extraocular movements intact.     Right eye: Nystagmus present.     Left eye: Nystagmus present.     Conjunctiva/sclera: Conjunctivae normal.  Cardiovascular:     Rate and Rhythm: Normal rate and regular rhythm.     Heart sounds: Normal heart sounds. No murmur heard.    No gallop.  Pulmonary:     Effort: Pulmonary effort is normal. No respiratory distress.     Breath sounds: Normal breath  sounds. No wheezing, rhonchi or rales.  Skin:    General: Skin is warm and dry.  Neurological:     Mental Status: She is alert and oriented to person, place, and time.      No results found for any visits on 07/25/23.    Assessment & Plan:  Benign paroxysmal positional vertigo due to bilateral vestibular disorder -     Meclizine HCl; Take 1 tablet (50 mg total) by mouth 3 (three) times daily as needed.  Dispense: 30 tablet; Refill: 0  BPPV likely due displaced otolith after rolling over in bed.  Epley maneuver performed in clinic with mild improvement in symptoms.  Can try Epley maneuver at home.  Rx for meclizine sent to pharmacy.  Also consider OTC natural eardrops  Return if symptoms worsen or fail to improve.   Deeann Saint, MD

## 2023-07-26 ENCOUNTER — Encounter (INDEPENDENT_AMBULATORY_CARE_PROVIDER_SITE_OTHER): Payer: Self-pay

## 2023-08-16 ENCOUNTER — Encounter: Payer: Self-pay | Admitting: Family Medicine

## 2023-08-16 ENCOUNTER — Other Ambulatory Visit (HOSPITAL_COMMUNITY): Payer: Self-pay

## 2023-08-16 ENCOUNTER — Ambulatory Visit (INDEPENDENT_AMBULATORY_CARE_PROVIDER_SITE_OTHER): Payer: Commercial Managed Care - PPO | Admitting: Family Medicine

## 2023-08-16 VITALS — BP 116/72 | HR 65 | Temp 97.9°F | Ht 59.0 in | Wt 160.1 lb

## 2023-08-16 DIAGNOSIS — I10 Essential (primary) hypertension: Secondary | ICD-10-CM | POA: Diagnosis not present

## 2023-08-16 DIAGNOSIS — Z1231 Encounter for screening mammogram for malignant neoplasm of breast: Secondary | ICD-10-CM

## 2023-08-16 DIAGNOSIS — Z0000 Encounter for general adult medical examination without abnormal findings: Secondary | ICD-10-CM

## 2023-08-16 DIAGNOSIS — Z13220 Encounter for screening for lipoid disorders: Secondary | ICD-10-CM | POA: Diagnosis not present

## 2023-08-16 DIAGNOSIS — K219 Gastro-esophageal reflux disease without esophagitis: Secondary | ICD-10-CM | POA: Diagnosis not present

## 2023-08-16 LAB — LIPID PANEL
Cholesterol: 191 mg/dL (ref 0–200)
HDL: 62.9 mg/dL (ref >39.00)
LDL Cholesterol: 115 mg/dL — ABNORMAL HIGH (ref 0–99)
NonHDL: 128.11
Total CHOL/HDL Ratio: 3
Triglycerides: 67 mg/dL (ref 0.0–149.0)
VLDL: 13.4 mg/dL (ref 0.0–40.0)

## 2023-08-16 MED ORDER — OMEPRAZOLE 20 MG PO CPDR
20.0000 mg | DELAYED_RELEASE_CAPSULE | Freq: Every day | ORAL | 1 refills | Status: DC
Start: 2023-08-16 — End: 2024-08-18
  Filled 2023-08-16: qty 90, 90d supply, fill #0

## 2023-08-16 MED ORDER — AMLODIPINE BESYLATE 2.5 MG PO TABS
ORAL_TABLET | ORAL | 1 refills | Status: DC
Start: 2023-08-16 — End: 2024-02-25
  Filled 2023-08-16 – 2023-10-10 (×2): qty 90, 90d supply, fill #0
  Filled 2024-01-16: qty 90, 90d supply, fill #1

## 2023-08-16 NOTE — Progress Notes (Signed)
Complete physical exam  Patient: [name]   DOB: 1981-11-24   42 y.o. Female  MRN: [id number]  Subjective:    Chief Complaint  Patient presents with   Annual Exam    [name] is a 42 y.o. female who presents today for a complete physical exam. She reports consuming a general, no diary diet due to lactose intolerance. Home exercise routine includes walking 0.5 hrs per day. She generally feels well. She reports sleeping well. She does not have additional problems to discuss today.    Most recent fall risk assessment:    06/25/2023   11:33 AM  Fall Risk   Falls in the past year? 0  Number falls in past yr: 0  Injury with Fall? 0  Risk for fall due to : No Fall Risks  Follow up Falls evaluation completed     Most recent depression screenings:    08/16/2023   10:24 AM 02/13/2023    9:15 AM  PHQ 2/9 Scores  PHQ - 2 Score 0 0  PHQ- 9 Score 1     Vision:no vision issues, does not have an eye doctor  and Dental: No current dental problems and Receives regular dental care  Patient Active Problem List   Diagnosis Date Noted   Allergic rhinitis 02/13/2023   Vertigo, peripheral, left 07/06/2022   Vitamin D deficiency 07/06/2022   Hypertension 02/17/2022   Lactose intolerance 06/27/2019   GERD (gastroesophageal reflux disease) 06/27/2019   Environmental allergies 11/15/2018   Bronchitis 11/15/2018   Wheezing 11/15/2018   Cough 11/15/2018   Chronic pain of right ankle 08/06/2017   Localized osteoarthritis of right ankle 08/06/2017   Syncope 11/25/2014   Leiomyoma of uterus 04/13/2014   Amenorrhea due to Depo Provera 04/13/2014      Patient Care Team: Karie Georges, MD as PCP - General (Family Medicine) Genia Del, MD as Consulting Physician (Obstetrics and Gynecology)   Outpatient Medications Prior to Visit  Medication Sig   albuterol (PROAIR HFA) 108 (90 Base) MCG/ACT inhaler Inhale 2 puffs into the lungs every 6 (six) hours as  needed for wheezing or shortness of breath.   Cholecalciferol (VITAMIN D3) 50 MCG (2000 UT) capsule Take 1 capsule (2,000 Units total) by mouth daily.   fluticasone (FLONASE) 50 MCG/ACT nasal spray USE 1 SPRAY INTO BOTH NOSTRILS DAILY.   ibuprofen (ADVIL) 800 MG tablet Take 1 tablet (800 mg total) by mouth every 8 (eight) hours as needed for up to 21 doses for fever, headache, mild pain or moderate pain.   meclizine (ANTIVERT) 25 MG tablet Take 2 tablets (50 mg total) by mouth 3 (three) times daily as needed.   methocarbamol (ROBAXIN) 500 MG tablet Take 1 tablet (500 mg total) by mouth 2 (two) times daily.   montelukast (SINGULAIR) 10 MG tablet Take 1 tablet (10 mg total) by mouth at bedtime.   Prenatal Vit-Fe Fumarate-FA (PRENATAL MULTIVITAMIN) TABS tablet Take 1 tablet by mouth daily at 12 noon.   [DISCONTINUED] amLODipine (NORVASC) 2.5 MG tablet TAKE 1 TABLET BY MOUTH DAILY. PATIENT NEEDS AN APPOINTMENT   [DISCONTINUED] omeprazole (PRILOSEC) 20 MG capsule Take 1 capsule (20 mg total) by mouth daily.   No facility-administered medications prior to visit.    Review of Systems  HENT:  Negative for hearing loss.   Eyes:  Negative for blurred vision.  Respiratory:  Negative for shortness of breath.   Cardiovascular:  Negative for chest pain.  Gastrointestinal: Negative.   Genitourinary:  Negative.   Musculoskeletal:  Negative for back pain.  Neurological:  Negative for headaches.  Psychiatric/Behavioral:  Negative for depression.   All other systems reviewed and are negative.      Objective:     BP 116/72 (BP Location: Left Arm, Patient Position: Sitting, Cuff Size: Normal)   Pulse 65   Temp 97.9 F (36.6 C) (Oral)   Ht 4\' 11"  (1.499 m)   Wt 160 lb 1.6 oz (72.6 kg)   LMP 08/11/2023 (Exact Date)   SpO2 98%   BMI 32.34 kg/m    Physical Exam Vitals reviewed.  Constitutional:      Appearance: Normal appearance. She is well-groomed and normal weight.  HENT:     Right Ear:  Tympanic membrane and ear canal normal.     Left Ear: Tympanic membrane and ear canal normal.     Mouth/Throat:     Mouth: Mucous membranes are moist.     Pharynx: No posterior oropharyngeal erythema.  Eyes:     Conjunctiva/sclera: Conjunctivae normal.  Neck:     Thyroid: No thyromegaly.  Cardiovascular:     Rate and Rhythm: Normal rate and regular rhythm.     Pulses: Normal pulses.     Heart sounds: S1 normal and S2 normal.  Pulmonary:     Effort: Pulmonary effort is normal.     Breath sounds: Normal breath sounds and air entry.  Abdominal:     General: Abdomen is flat. Bowel sounds are normal.     Palpations: Abdomen is soft.  Musculoskeletal:     Right lower leg: No edema.     Left lower leg: No edema.  Lymphadenopathy:     Cervical: No cervical adenopathy.  Neurological:     Mental Status: She is alert and oriented to person, place, and time. Mental status is at baseline.     Gait: Gait is intact.  Psychiatric:        Mood and Affect: Mood and affect normal.        Speech: Speech normal.        Behavior: Behavior normal.        Judgment: Judgment normal.     No results found for any visits on 08/16/23. Last metabolic panel Lab Results  Component Value Date   GLUCOSE 79 02/13/2023   NA 136 02/13/2023   K 4.1 02/13/2023   CL 101 02/13/2023   CO2 28 02/13/2023   BUN 11 02/13/2023   CREATININE 0.92 02/13/2023   GFR 77.42 02/13/2023   CALCIUM 9.5 02/13/2023   PROT 7.6 02/13/2023   ALBUMIN 4.0 02/13/2023   BILITOT 0.4 02/13/2023   ALKPHOS 61 02/13/2023   AST 14 02/13/2023   ALT 9 02/13/2023   ANIONGAP 13 11/23/2014        Assessment & Plan:    Routine Health Maintenance and Physical Exam  Immunization History  Administered Date(s) Administered   Influenza,inj,Quad PF,6+ Mos 08/12/2021, 08/15/2022   Influenza-Unspecified 08/11/2017, 08/11/2018   PFIZER(Purple Top)SARS-COV-2 Vaccination 02/27/2020, 03/12/2020   Tdap 08/12/2021    Health Maintenance   Topic Date Due   COVID-19 Vaccine (3 - 2023-24 season) 09/01/2023 (Originally 08/12/2023)   INFLUENZA VACCINE  03/10/2024 (Originally 07/12/2023)   PAP SMEAR-Modifier  06/24/2026   DTaP/Tdap/Td (2 - Td or Tdap) 08/13/2031   Hepatitis C Screening  Completed   HIV Screening  Completed   HPV VACCINES  Aged Out    Discussed health benefits of physical activity, and encouraged her to engage in regular  exercise appropriate for her age and condition.  Encounter for routine adult health examination without abnormal findings  Essential hypertension -     amLODIPine Besylate; TAKE 1 TABLET BY MOUTH DAILY. PATIENT NEEDS AN APPOINTMENT  Dispense: 90 tablet; Refill: 1  Gastroesophageal reflux disease, unspecified whether esophagitis present -     Omeprazole; Take 1 capsule (20 mg total) by mouth daily.  Dispense: 90 capsule; Refill: 1  Lipid screening -     Lipid panel; Future  Breast cancer screening by mammogram -     3D Screening Mammogram, Left and Right; Future   Normal physical exam findings today, reviewed HM and she is due for mammogram. Ordering lipid panel for suveillance. Counseled patient on healthy eating and exercise.   Return in about 6 months (around 02/13/2024) for HTN.     Karie Georges, MD

## 2023-08-17 ENCOUNTER — Other Ambulatory Visit (HOSPITAL_COMMUNITY): Payer: Self-pay

## 2023-10-02 ENCOUNTER — Ambulatory Visit
Admission: RE | Admit: 2023-10-02 | Discharge: 2023-10-02 | Disposition: A | Discharge status: Home / Self Care | Payer: Commercial Managed Care - PPO | Source: Ambulatory Visit | Attending: Family Medicine | Admitting: Family Medicine

## 2023-10-02 DIAGNOSIS — Z1231 Encounter for screening mammogram for malignant neoplasm of breast: Secondary | ICD-10-CM

## 2023-10-09 ENCOUNTER — Ambulatory Visit: Payer: 59 | Admitting: Obstetrics & Gynecology

## 2023-10-09 ENCOUNTER — Ambulatory Visit: Payer: 59 | Admitting: Obstetrics and Gynecology

## 2023-10-09 DIAGNOSIS — N979 Female infertility, unspecified: Secondary | ICD-10-CM | POA: Diagnosis not present

## 2023-10-09 DIAGNOSIS — N96 Recurrent pregnancy loss: Secondary | ICD-10-CM | POA: Diagnosis not present

## 2023-10-09 DIAGNOSIS — N883 Incompetence of cervix uteri: Secondary | ICD-10-CM | POA: Diagnosis not present

## 2023-10-09 DIAGNOSIS — I10 Essential (primary) hypertension: Secondary | ICD-10-CM | POA: Diagnosis not present

## 2023-10-10 ENCOUNTER — Other Ambulatory Visit (HOSPITAL_COMMUNITY): Payer: Self-pay

## 2023-10-12 ENCOUNTER — Other Ambulatory Visit (HOSPITAL_COMMUNITY): Payer: Self-pay

## 2023-12-27 ENCOUNTER — Other Ambulatory Visit (HOSPITAL_COMMUNITY): Payer: Self-pay

## 2023-12-31 ENCOUNTER — Other Ambulatory Visit (HOSPITAL_COMMUNITY): Payer: Self-pay

## 2024-01-02 ENCOUNTER — Other Ambulatory Visit (HOSPITAL_COMMUNITY): Payer: Self-pay

## 2024-01-02 ENCOUNTER — Encounter: Payer: Self-pay | Admitting: Family Medicine

## 2024-01-02 ENCOUNTER — Ambulatory Visit: Payer: Commercial Managed Care - PPO | Admitting: Family Medicine

## 2024-01-02 ENCOUNTER — Ambulatory Visit: Payer: Commercial Managed Care - PPO | Admitting: Internal Medicine

## 2024-01-02 VITALS — BP 136/78 | HR 116 | Temp 101.3°F | Wt 157.0 lb

## 2024-01-02 DIAGNOSIS — R509 Fever, unspecified: Secondary | ICD-10-CM | POA: Diagnosis not present

## 2024-01-02 DIAGNOSIS — J40 Bronchitis, not specified as acute or chronic: Secondary | ICD-10-CM | POA: Diagnosis not present

## 2024-01-02 LAB — POCT INFLUENZA A/B
Influenza A, POC: NEGATIVE
Influenza B, POC: NEGATIVE

## 2024-01-02 LAB — POC COVID19 BINAXNOW: SARS Coronavirus 2 Ag: NEGATIVE

## 2024-01-02 MED ORDER — AZITHROMYCIN 250 MG PO TABS
ORAL_TABLET | ORAL | 0 refills | Status: AC
  Filled 2024-01-02: qty 6, 5d supply, fill #0

## 2024-01-02 MED ORDER — HYDROCODONE BIT-HOMATROP MBR 5-1.5 MG/5ML PO SOLN
5.0000 mL | ORAL | 0 refills | Status: DC | PRN
Start: 2024-01-02 — End: 2024-02-25
  Filled 2024-01-02: qty 240, 8d supply, fill #0

## 2024-01-02 NOTE — Progress Notes (Signed)
   Subjective:    Patient ID: [name], female    DOB: Mar 28, 1981, 43 y.o.   MRN: [id number]  HPI Here for 4 days of coughing up green sputum, stuffy head, and fever. No ST or body aches. She has some SOB but is using her albuterol  inhaler.    Review of Systems  Constitutional:  Positive for fever.  HENT:  Positive for congestion. Negative for ear pain, postnasal drip, sinus pain and sore throat.   Eyes: Negative.   Respiratory:  Positive for cough and shortness of breath.        Objective:   Physical Exam Constitutional:      Appearance: Normal appearance.  HENT:     Right Ear: Tympanic membrane, ear canal and external ear normal.     Left Ear: Tympanic membrane, ear canal and external ear normal.     Nose: Nose normal.     Mouth/Throat:     Pharynx: Oropharynx is clear.  Eyes:     Conjunctiva/sclera: Conjunctivae normal.  Pulmonary:     Effort: Pulmonary effort is normal.     Breath sounds: Rhonchi present. No wheezing or rales.  Lymphadenopathy:     Cervical: No cervical adenopathy.  Neurological:     Mental Status: She is alert.           Assessment & Plan:  Bronchitis, treat with a Zpack.  Gershon Crane, MD

## 2024-01-02 NOTE — Addendum Note (Signed)
Addended by: Carola Rhine on: 01/02/2024 05:46 PM   Modules accepted: Orders

## 2024-01-08 ENCOUNTER — Telehealth: Payer: Self-pay

## 2024-01-08 ENCOUNTER — Telehealth: Payer: Self-pay | Admitting: Family Medicine

## 2024-01-08 NOTE — Telephone Encounter (Signed)
Copied from CRM [id number]. Topic: General - Other >> Jan 08, 2024 10:36 AM [name] wrote: Reason for CRM: patient called wanting to know if the doctor has received any paperwork from [employer] for her return to work. Patient also want to know if she need to come back in for her cough and her wheezing. Patient saw Dr. Clent Ridges on 1/22 and she would like to see her provider. Patient stated she was given cough and congestion medication

## 2024-01-08 NOTE — Telephone Encounter (Signed)
Spoke with the patient and she stated she is still not feeling well.  States she was seen by Dr Clent Ridges on 1/22, diagnosed with bronchitis and attempted to return to work today.  Employer advised she go home due to coughing.  Patient stated she spoke with [employer], paperwork should be sent via fax in 24 hours and would like to be out from 1/22-2/2.  Appt scheduled with Dr Caryl Never on 1/29 due to recurrent cough with bloody sputum as patient stated she used a friends nebulizer machine also.  Message sent to PCP as FYI for forms and any other recommendations.

## 2024-01-08 NOTE — Telephone Encounter (Signed)
Left a detailed message at the patient's cell number stating we have not received any papers via fax from [employer] as this would generally go to the provider that wrote her out of work.  Detailed message also left stating a follow up is generally not needed unless she is not feeling better or has some other new problems etc.  Requested patient call back and speak with me if needed.

## 2024-01-08 NOTE — Telephone Encounter (Signed)
Noted, I can fill out the papers or since she is seeing Dr. Caryl Never he could also give her a work excuse.

## 2024-01-08 NOTE — Telephone Encounter (Signed)
Copied from CRM [id number]. Topic: General - Other >> Jan 08, 2024  2:23 PM [name] wrote: Reason for CRM: Pt called employer is faxing from [employer] absence of work to be completed by PCP. Please call pt [phone].

## 2024-01-08 NOTE — Telephone Encounter (Signed)
Patient informed of the message below.

## 2024-01-08 NOTE — Telephone Encounter (Signed)
See prior phone note.

## 2024-01-09 ENCOUNTER — Other Ambulatory Visit (HOSPITAL_COMMUNITY): Payer: Self-pay

## 2024-01-09 ENCOUNTER — Encounter: Payer: Self-pay | Admitting: Family Medicine

## 2024-01-09 ENCOUNTER — Ambulatory Visit: Payer: Commercial Managed Care - PPO | Admitting: Family Medicine

## 2024-01-09 VITALS — BP 130/78 | HR 98 | Temp 98.2°F | Wt 155.2 lb

## 2024-01-09 DIAGNOSIS — R051 Acute cough: Secondary | ICD-10-CM | POA: Diagnosis not present

## 2024-01-09 DIAGNOSIS — R062 Wheezing: Secondary | ICD-10-CM | POA: Diagnosis not present

## 2024-01-09 MED ORDER — PREDNISONE 20 MG PO TABS
ORAL_TABLET | ORAL | 0 refills | Status: DC
  Filled 2024-01-09: qty 12, 6d supply, fill #0

## 2024-01-09 NOTE — Progress Notes (Signed)
Established Patient Office Visit  Subjective   Patient ID: [name], female    DOB: November 02, 1981  Age: 43 y.o. MRN: [id number]  Chief Complaint  Patient presents with   Cough    Patient complains of productive cough, x2 weeks     HPI   Seen today with some continued productive cough.  Refer to past note for detail.  She was seen a week ago today with fever.  COVID and influenza testing then was negative.  She was treated with Zithromax and also given Hycodan cough syrup which she has been taking at night.  She does take albuterol inhaler occasionally.  She feels like she may have some wheezing.  No known history of asthma.  Non-smoker but is exposed to smoke secondhand.  She has been out of work apparently since the 23rd and plans to go back February 3.  She initially had fever for about 2 days but none since then  Past Medical History:  Diagnosis Date   Abnormal Pap smear of cervix    Hypertension    IBS (irritable bowel syndrome) 04/13/2014   Sickle cell trait (HCC)    Vertigo    Past Surgical History:  Procedure Laterality Date   COLPOSCOPY      reports that she has never smoked. She has never used smokeless tobacco. She reports that she does not drink alcohol and does not use drugs. family history includes Diabetes in her maternal aunt; Hypertension in her maternal aunt and mother. Allergies  Allergen Reactions   Latex Itching   Lidocaine     Rash and irritation at patch application site    Review of Systems  Constitutional:  Negative for chills and fever.  HENT:  Negative for sinus pain and sore throat.   Respiratory:  Positive for cough, sputum production and wheezing. Negative for hemoptysis.   Cardiovascular:  Negative for chest pain.      Objective:     BP 130/78 (BP Location: Left Arm, Patient Position: Sitting, Cuff Size: Normal)   Pulse 98   Temp 98.2 F (36.8 C) (Oral)   Wt 155 lb 3.2 oz (70.4 kg)   SpO2 98%   BMI 31.35 kg/m  BP  Readings from Last 3 Encounters:  01/09/24 130/78  01/02/24 136/78  08/16/23 116/72   Wt Readings from Last 3 Encounters:  01/09/24 155 lb 3.2 oz (70.4 kg)  01/02/24 157 lb (71.2 kg)  08/16/23 160 lb 1.6 oz (72.6 kg)      Physical Exam Vitals reviewed.  Constitutional:      Appearance: She is not ill-appearing.  Cardiovascular:     Rate and Rhythm: Normal rate and regular rhythm.  Pulmonary:     Effort: No respiratory distress.     Breath sounds: Wheezing present. No rales.  Musculoskeletal:     Cervical back: Neck supple.  Neurological:     Mental Status: She is alert.      No results found for any visits on 01/09/24.    The 10-year ASCVD risk score (Arnett DK, et al., 2019) is: 1.4%    Assessment & Plan:   Persistent cough.  She does have some wheezing on exam today.  No respiratory distress.  O2 sats 98% room air.  No retractions.  Suspect this is residual from recent URI.  Prednisone 20 mg 2 tablets daily for 5 days.  Continue albuterol inhaler as needed.  Follow-up promptly for any fever, increased shortness of breath, or other concerns.  No indication for further antibiotics at this time.  Evelena Peat, MD

## 2024-01-10 ENCOUNTER — Telehealth: Payer: Self-pay

## 2024-01-10 NOTE — Telephone Encounter (Signed)
Copied from CRM [id number]. Topic: General - Other >> Jan 10, 2024  9:52 AM Truddie Crumble wrote: Reason for CRM: Patient called wanting an update on her FMLA paperwork. I called the office and Nelva Bush told me she will call the patient back, she will check with the CMA

## 2024-01-10 NOTE — Telephone Encounter (Signed)
Pt will have fmla printed out and bring to office and she is aware maybe a charge to complete

## 2024-01-15 NOTE — Telephone Encounter (Signed)
Left message on machine for patient to return our call 

## 2024-01-15 NOTE — Telephone Encounter (Signed)
Can you ask her what the FMLA is for and how long she was out? I just need this info so I can fill it out

## 2024-01-15 NOTE — Telephone Encounter (Signed)
Pt called back stating she was out for a lingering cough, was treated in office 01/09/24. Says she was out of work 01/08/24-01/14/24

## 2024-01-16 ENCOUNTER — Other Ambulatory Visit (HOSPITAL_COMMUNITY): Payer: Self-pay

## 2024-01-16 DIAGNOSIS — Z0279 Encounter for issue of other medical certificate: Secondary | ICD-10-CM

## 2024-01-17 ENCOUNTER — Other Ambulatory Visit (HOSPITAL_COMMUNITY): Payer: Self-pay

## 2024-01-17 ENCOUNTER — Ambulatory Visit: Payer: Commercial Managed Care - PPO | Admitting: Family Medicine

## 2024-01-17 NOTE — Telephone Encounter (Addendum)
 Faxed signed paper work to [employer] and confirmation page is with paperwork.

## 2024-02-13 ENCOUNTER — Ambulatory Visit: Payer: Commercial Managed Care - PPO | Admitting: Family Medicine

## 2024-02-25 ENCOUNTER — Encounter: Payer: Self-pay | Admitting: Family Medicine

## 2024-02-25 ENCOUNTER — Other Ambulatory Visit (HOSPITAL_COMMUNITY): Payer: Self-pay

## 2024-02-25 ENCOUNTER — Ambulatory Visit (INDEPENDENT_AMBULATORY_CARE_PROVIDER_SITE_OTHER): Admitting: Family Medicine

## 2024-02-25 VITALS — BP 118/64 | HR 70 | Temp 98.1°F | Wt 153.9 lb

## 2024-02-25 DIAGNOSIS — J309 Allergic rhinitis, unspecified: Secondary | ICD-10-CM

## 2024-02-25 DIAGNOSIS — I10 Essential (primary) hypertension: Secondary | ICD-10-CM

## 2024-02-25 MED ORDER — MONTELUKAST SODIUM 10 MG PO TABS
10.0000 mg | ORAL_TABLET | Freq: Every day | ORAL | 1 refills | Status: AC
  Filled 2024-02-25: qty 90, 90d supply, fill #0

## 2024-02-25 MED ORDER — AMLODIPINE BESYLATE 2.5 MG PO TABS
2.5000 mg | ORAL_TABLET | Freq: Every day | ORAL | 1 refills | Status: DC
  Filled 2024-02-25 – 2024-04-09 (×2): qty 90, 90d supply, fill #0
  Filled 2024-07-15: qty 90, 90d supply, fill #1

## 2024-02-25 NOTE — Assessment & Plan Note (Signed)
 Pt reports vertigo symptoms when she gets "sick" states she does have meclizine Prn at home, states she usually gets severe allergies in the spring, already has flonase at home, will refill the singulair 10 mg daily for her. RTC 6 months.

## 2024-02-25 NOTE — Assessment & Plan Note (Signed)
 Chronic, stable. Well controlled, on 2.5 mg amlodipine daily. Continue this medication as prescribed

## 2024-02-25 NOTE — Progress Notes (Signed)
 Established Patient Office Visit  Subjective   Patient ID: [name], female    DOB: Mar 23, 1981  Age: 43 y.o. MRN: [id number]  Chief Complaint  Patient presents with   Medical Management of Chronic Issues    Pt is here for 6 month follow up today on HTN she reports that she is taking her medication as prescribed. States that there are no chest pain or SOB.   She had a fairly severe infection in January. States that she gest vertigo with any type of infection. States that she gest flare ups every now and again, sometimes with weather changes like in the spring and the winter. Always on the left side. States   GERD-- pt reports she is only taking the omeprazole as needed when she has heart burn. We reviewed her medications, reports she still has a refill of this.       Current Outpatient Medications  Medication Instructions   albuterol (PROAIR HFA) 108 (90 Base) MCG/ACT inhaler 2 puffs, Inhalation, Every 6 hours PRN   amLODipine (NORVASC) 2.5 mg, Oral, Daily   fluticasone (FLONASE) 50 MCG/ACT nasal spray USE 1 SPRAY INTO BOTH NOSTRILS DAILY.   ibuprofen (ADVIL) 800 mg, Oral, Every 8 hours PRN   meclizine (ANTIVERT) 50 mg, Oral, 3 times daily PRN   methocarbamol (ROBAXIN) 500 mg, Oral, 2 times daily   montelukast (SINGULAIR) 10 mg, Oral, Daily at bedtime   omeprazole (PRILOSEC) 20 mg, Oral, Daily   Prenatal Vit-Fe Fumarate-FA (PRENATAL MULTIVITAMIN) TABS tablet 1 tablet, Daily   Vitamin D3 2,000 Units, Oral, Daily    Patient Active Problem List   Diagnosis Date Noted   Allergic rhinitis 02/13/2023   Vertigo, peripheral, left 07/06/2022   Vitamin D deficiency 07/06/2022   Hypertension 02/17/2022   Lactose intolerance 06/27/2019   GERD (gastroesophageal reflux disease) 06/27/2019   Environmental allergies 11/15/2018   Bronchitis 11/15/2018   Cough 11/15/2018   Chronic pain of right ankle 08/06/2017   Localized osteoarthritis of right ankle 08/06/2017    Syncope 11/25/2014   Leiomyoma of uterus 04/13/2014   Amenorrhea due to Depo Provera 04/13/2014      Review of Systems  All other systems reviewed and are negative.     Objective:     BP 118/64   Pulse 70   Temp 98.1 F (36.7 C) (Oral)   Wt 153 lb 14.4 oz (69.8 kg)   LMP 02/19/2024 (Exact Date)   SpO2 98%   BMI 31.08 kg/m    Physical Exam Vitals reviewed.  Constitutional:      Appearance: Normal appearance. She is well-groomed. She is obese.  Eyes:     Conjunctiva/sclera: Conjunctivae normal.  Neck:     Thyroid: No thyromegaly.  Cardiovascular:     Rate and Rhythm: Normal rate and regular rhythm.     Pulses: Normal pulses.     Heart sounds: S1 normal and S2 normal.  Pulmonary:     Effort: Pulmonary effort is normal.     Breath sounds: Normal breath sounds and air entry.  Abdominal:     General: Bowel sounds are normal.  Musculoskeletal:     Right lower leg: No edema.     Left lower leg: No edema.  Neurological:     Mental Status: She is alert and oriented to person, place, and time. Mental status is at baseline.     Gait: Gait is intact.  Psychiatric:        Mood and Affect: Mood and  affect normal.        Speech: Speech normal.        Behavior: Behavior normal.        Judgment: Judgment normal.      No results found for any visits on 02/25/24.    The 10-year ASCVD risk score (Arnett DK, et al., 2019) is: 0.8%    Assessment & Plan:  Hypertension, unspecified type Assessment & Plan: Chronic, stable. Well controlled, on 2.5 mg amlodipine daily. Continue this medication as prescribed  Orders: -     amLODIPine Besylate; Take 1 tablet (2.5 mg total) by mouth daily.  Dispense: 90 tablet; Refill: 1  Allergic rhinitis, unspecified seasonality, unspecified trigger Assessment & Plan: Pt reports vertigo symptoms when she gets "sick" states she does have meclizine Prn at home, states she usually gets severe allergies in the spring, already has flonase at  home, will refill the singulair 10 mg daily for her. RTC 6 months.   Orders: -     Montelukast Sodium; Take 1 tablet (10 mg total) by mouth at bedtime.  Dispense: 90 tablet; Refill: 1     Return in about 6 months (around 08/27/2024) for annual physical exam.    Karie Georges, MD

## 2024-04-09 ENCOUNTER — Other Ambulatory Visit (HOSPITAL_COMMUNITY): Payer: Self-pay

## 2024-04-10 ENCOUNTER — Other Ambulatory Visit (HOSPITAL_COMMUNITY): Payer: Self-pay

## 2024-05-19 DIAGNOSIS — N898 Other specified noninflammatory disorders of vagina: Secondary | ICD-10-CM | POA: Diagnosis not present

## 2024-05-19 DIAGNOSIS — R350 Frequency of micturition: Secondary | ICD-10-CM | POA: Diagnosis not present

## 2024-06-09 DIAGNOSIS — N912 Amenorrhea, unspecified: Secondary | ICD-10-CM | POA: Diagnosis not present

## 2024-06-12 ENCOUNTER — Other Ambulatory Visit (HOSPITAL_COMMUNITY): Payer: Self-pay

## 2024-06-12 DIAGNOSIS — N760 Acute vaginitis: Secondary | ICD-10-CM | POA: Diagnosis not present

## 2024-06-12 DIAGNOSIS — O209 Hemorrhage in early pregnancy, unspecified: Secondary | ICD-10-CM | POA: Diagnosis not present

## 2024-06-12 DIAGNOSIS — N898 Other specified noninflammatory disorders of vagina: Secondary | ICD-10-CM | POA: Diagnosis not present

## 2024-06-12 DIAGNOSIS — Z113 Encounter for screening for infections with a predominantly sexual mode of transmission: Secondary | ICD-10-CM | POA: Diagnosis not present

## 2024-06-12 MED ORDER — CLINDAMYCIN HCL 300 MG PO CAPS
300.0000 mg | ORAL_CAPSULE | Freq: Two times a day (BID) | ORAL | 0 refills | Status: DC
  Filled 2024-06-12: qty 14, 7d supply, fill #0

## 2024-06-13 ENCOUNTER — Other Ambulatory Visit: Payer: Self-pay

## 2024-06-13 ENCOUNTER — Emergency Department (HOSPITAL_COMMUNITY)
Admission: EM | Admit: 2024-06-13 | Discharge: 2024-06-14 | Disposition: A | Discharge status: Home / Self Care | Attending: Emergency Medicine | Admitting: Emergency Medicine

## 2024-06-13 ENCOUNTER — Encounter (HOSPITAL_COMMUNITY): Payer: Self-pay

## 2024-06-13 DIAGNOSIS — Y92410 Unspecified street and highway as the place of occurrence of the external cause: Secondary | ICD-10-CM | POA: Insufficient documentation

## 2024-06-13 DIAGNOSIS — M545 Low back pain, unspecified: Secondary | ICD-10-CM | POA: Diagnosis not present

## 2024-06-13 DIAGNOSIS — O26899 Other specified pregnancy related conditions, unspecified trimester: Secondary | ICD-10-CM

## 2024-06-13 DIAGNOSIS — Z91040 Latex allergy status: Secondary | ICD-10-CM | POA: Diagnosis not present

## 2024-06-13 DIAGNOSIS — O26891 Other specified pregnancy related conditions, first trimester: Secondary | ICD-10-CM | POA: Diagnosis not present

## 2024-06-13 DIAGNOSIS — Z3A01 Less than 8 weeks gestation of pregnancy: Secondary | ICD-10-CM | POA: Diagnosis not present

## 2024-06-13 DIAGNOSIS — R1030 Lower abdominal pain, unspecified: Secondary | ICD-10-CM | POA: Diagnosis not present

## 2024-06-13 DIAGNOSIS — R0689 Other abnormalities of breathing: Secondary | ICD-10-CM | POA: Diagnosis not present

## 2024-06-13 DIAGNOSIS — O09511 Supervision of elderly primigravida, first trimester: Secondary | ICD-10-CM | POA: Diagnosis not present

## 2024-06-13 DIAGNOSIS — O9A211 Injury, poisoning and certain other consequences of external causes complicating pregnancy, first trimester: Secondary | ICD-10-CM | POA: Diagnosis not present

## 2024-06-13 DIAGNOSIS — R109 Unspecified abdominal pain: Secondary | ICD-10-CM | POA: Insufficient documentation

## 2024-06-13 DIAGNOSIS — O3481 Maternal care for other abnormalities of pelvic organs, first trimester: Secondary | ICD-10-CM | POA: Diagnosis not present

## 2024-06-13 DIAGNOSIS — D259 Leiomyoma of uterus, unspecified: Secondary | ICD-10-CM | POA: Diagnosis not present

## 2024-06-13 LAB — HCG, SERUM, QUALITATIVE: Preg, Serum: POSITIVE — AB

## 2024-06-13 MED ORDER — ACETAMINOPHEN 325 MG PO TABS
650.0000 mg | ORAL_TABLET | Freq: Once | ORAL | Status: DC
  Filled 2024-06-13: qty 2
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
## 2024-06-14 ENCOUNTER — Emergency Department (HOSPITAL_COMMUNITY)

## 2024-06-14 ENCOUNTER — Other Ambulatory Visit (HOSPITAL_COMMUNITY): Payer: Self-pay

## 2024-06-14 DIAGNOSIS — R109 Unspecified abdominal pain: Secondary | ICD-10-CM

## 2024-06-14 DIAGNOSIS — O9A211 Injury, poisoning and certain other consequences of external causes complicating pregnancy, first trimester: Secondary | ICD-10-CM | POA: Diagnosis not present

## 2024-06-14 DIAGNOSIS — Z3A01 Less than 8 weeks gestation of pregnancy: Secondary | ICD-10-CM

## 2024-06-14 DIAGNOSIS — D259 Leiomyoma of uterus, unspecified: Secondary | ICD-10-CM | POA: Diagnosis not present

## 2024-06-14 DIAGNOSIS — O26891 Other specified pregnancy related conditions, first trimester: Secondary | ICD-10-CM | POA: Diagnosis not present

## 2024-06-14 DIAGNOSIS — O26899 Other specified pregnancy related conditions, unspecified trimester: Secondary | ICD-10-CM

## 2024-06-14 DIAGNOSIS — O09511 Supervision of elderly primigravida, first trimester: Secondary | ICD-10-CM | POA: Diagnosis not present

## 2024-06-14 DIAGNOSIS — O3481 Maternal care for other abnormalities of pelvic organs, first trimester: Secondary | ICD-10-CM | POA: Diagnosis not present

## 2024-06-14 LAB — CBC WITH DIFFERENTIAL/PLATELET
Abs Immature Granulocytes: 0.06 K/uL (ref 0.00–0.07)
Basophils Absolute: 0.1 K/uL (ref 0.0–0.1)
Basophils Relative: 1 %
Eosinophils Absolute: 0.2 K/uL (ref 0.0–0.5)
Eosinophils Relative: 1 %
HCT: 37.5 % (ref 36.0–46.0)
Hemoglobin: 13.5 g/dL (ref 12.0–15.0)
Immature Granulocytes: 0 %
Lymphocytes Relative: 22 %
Lymphs Abs: 3.3 K/uL (ref 0.7–4.0)
MCH: 28.1 pg (ref 26.0–34.0)
MCHC: 36 g/dL (ref 30.0–36.0)
MCV: 78 fL — ABNORMAL LOW (ref 80.0–100.0)
Monocytes Absolute: 1.2 K/uL — ABNORMAL HIGH (ref 0.1–1.0)
Monocytes Relative: 8 %
Neutro Abs: 10.2 K/uL — ABNORMAL HIGH (ref 1.7–7.7)
Neutrophils Relative %: 68 %
Platelets: 348 K/uL (ref 150–400)
RBC: 4.81 MIL/uL (ref 3.87–5.11)
RDW: 13.8 % (ref 11.5–15.5)
WBC: 15 K/uL — ABNORMAL HIGH (ref 4.0–10.5)
nRBC: 0 % (ref 0.0–0.2)

## 2024-06-14 LAB — COMPREHENSIVE METABOLIC PANEL WITH GFR
ALT: 16 U/L (ref 0–44)
AST: 16 U/L (ref 15–41)
Albumin: 3.8 g/dL (ref 3.5–5.0)
Alkaline Phosphatase: 57 U/L (ref 38–126)
Anion gap: 9 (ref 5–15)
BUN: 10 mg/dL (ref 6–20)
CO2: 23 mmol/L (ref 22–32)
Calcium: 9.2 mg/dL (ref 8.9–10.3)
Chloride: 104 mmol/L (ref 98–111)
Creatinine, Ser: 0.99 mg/dL (ref 0.44–1.00)
GFR, Estimated: >60 mL/min (ref >60)
Glucose, Bld: 101 mg/dL — ABNORMAL HIGH (ref 70–99)
Potassium: 4.2 mmol/L (ref 3.5–5.1)
Sodium: 136 mmol/L (ref 135–145)
Total Bilirubin: 0.6 mg/dL (ref 0.0–1.2)
Total Protein: 8 g/dL (ref 6.5–8.1)

## 2024-06-14 LAB — ABO/RH: ABO/RH(D): O POS

## 2024-06-14 LAB — HCG, QUANTITATIVE, PREGNANCY: hCG, Beta Chain, Quant, S: 2655 m[IU]/mL — ABNORMAL HIGH (ref <5)
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
## 2024-06-14 NOTE — ED Notes (Signed)
Pt transported to Ultrasound.  

## 2024-06-14 NOTE — Discharge Instructions (Signed)
 Testing is reassuring.  You do have a early pregnancy with gestational sac but no embryo visible.  You should have a repeat ultrasound in 10 to 14 days.  Follow-up with your gynecologist.  Return to the ED with worsening abdominal pain, vaginal bleeding or other concerns.

## 2024-06-16 ENCOUNTER — Ambulatory Visit: Admitting: Family Medicine

## 2024-06-16 ENCOUNTER — Encounter: Payer: Self-pay | Admitting: Family Medicine

## 2024-06-16 ENCOUNTER — Ambulatory Visit: Payer: Self-pay

## 2024-06-16 ENCOUNTER — Other Ambulatory Visit (HOSPITAL_COMMUNITY): Payer: Self-pay

## 2024-06-16 ENCOUNTER — Telehealth: Payer: Self-pay

## 2024-06-16 VITALS — BP 134/86 | HR 85 | Temp 98.1°F | Ht 59.0 in | Wt 155.0 lb

## 2024-06-16 DIAGNOSIS — M545 Low back pain, unspecified: Secondary | ICD-10-CM | POA: Diagnosis not present

## 2024-06-16 DIAGNOSIS — R202 Paresthesia of skin: Secondary | ICD-10-CM | POA: Diagnosis not present

## 2024-06-16 DIAGNOSIS — R200 Anesthesia of skin: Secondary | ICD-10-CM

## 2024-06-16 NOTE — Telephone Encounter (Signed)
 Noted- ok to close.
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
## 2024-06-16 NOTE — Patient Instructions (Addendum)
-  It was nice to care for you.  -Deferred lumbar spine x-ray due to pregnancy.  -Recommend to take Tylenol  500mg -1000mg  tablet every 8 hours as needed for lumbar pain.  -Recommend to apply heat or ice compresses to your back, 4-6 times a day, up to 20 minutes at a time.  -Placed a referral to physical therapy. Please call the office or send a MyChart message if you do not receive a phone call or a MyChart message about appointment in 2 weeks.  -Work note to be excused until Thursday, July 10th. Will need to follow up with PCP if more time is needed.

## 2024-06-16 NOTE — Telephone Encounter (Signed)
See prior triage note.

## 2024-06-17 ENCOUNTER — Ambulatory Visit: Attending: Family Medicine | Admitting: Physical Therapy

## 2024-06-17 ENCOUNTER — Encounter: Payer: Self-pay | Admitting: Physical Therapy

## 2024-06-17 ENCOUNTER — Other Ambulatory Visit: Payer: Self-pay

## 2024-06-17 DIAGNOSIS — R200 Anesthesia of skin: Secondary | ICD-10-CM | POA: Insufficient documentation

## 2024-06-17 DIAGNOSIS — R293 Abnormal posture: Secondary | ICD-10-CM | POA: Diagnosis not present

## 2024-06-17 DIAGNOSIS — R279 Unspecified lack of coordination: Secondary | ICD-10-CM | POA: Insufficient documentation

## 2024-06-17 DIAGNOSIS — M545 Low back pain, unspecified: Secondary | ICD-10-CM | POA: Insufficient documentation

## 2024-06-17 DIAGNOSIS — M5459 Other low back pain: Secondary | ICD-10-CM | POA: Diagnosis not present

## 2024-06-17 DIAGNOSIS — O209 Hemorrhage in early pregnancy, unspecified: Secondary | ICD-10-CM | POA: Diagnosis not present

## 2024-06-17 DIAGNOSIS — M62838 Other muscle spasm: Secondary | ICD-10-CM | POA: Diagnosis not present

## 2024-06-17 DIAGNOSIS — M6281 Muscle weakness (generalized): Secondary | ICD-10-CM | POA: Diagnosis not present

## 2024-06-17 DIAGNOSIS — N96 Recurrent pregnancy loss: Secondary | ICD-10-CM | POA: Diagnosis not present

## 2024-06-17 DIAGNOSIS — O3410 Maternal care for benign tumor of corpus uteri, unspecified trimester: Secondary | ICD-10-CM | POA: Diagnosis not present

## 2024-06-17 DIAGNOSIS — R202 Paresthesia of skin: Secondary | ICD-10-CM | POA: Diagnosis not present
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
## 2024-06-23 ENCOUNTER — Other Ambulatory Visit: Payer: Self-pay | Admitting: Obstetrics & Gynecology

## 2024-06-23 DIAGNOSIS — O3680X0 Pregnancy with inconclusive fetal viability, not applicable or unspecified: Secondary | ICD-10-CM

## 2024-06-23 NOTE — Progress Notes (Signed)
 Order for US   Myracle Febres, DO Attending Obstetrician & Gynecologist, [facility] for [employer], [facility]

## 2024-06-25 ENCOUNTER — Other Ambulatory Visit

## 2024-07-01 ENCOUNTER — Other Ambulatory Visit (HOSPITAL_BASED_OUTPATIENT_CLINIC_OR_DEPARTMENT_OTHER): Payer: Self-pay

## 2024-07-01 ENCOUNTER — Other Ambulatory Visit: Payer: Self-pay | Admitting: Obstetrics and Gynecology

## 2024-07-01 DIAGNOSIS — D251 Intramural leiomyoma of uterus: Secondary | ICD-10-CM | POA: Diagnosis not present

## 2024-07-01 DIAGNOSIS — F439 Reaction to severe stress, unspecified: Secondary | ICD-10-CM | POA: Diagnosis not present

## 2024-07-01 DIAGNOSIS — O020 Blighted ovum and nonhydatidiform mole: Secondary | ICD-10-CM | POA: Diagnosis not present

## 2024-07-01 DIAGNOSIS — O2620 Pregnancy care for patient with recurrent pregnancy loss, unspecified trimester: Secondary | ICD-10-CM | POA: Diagnosis not present

## 2024-07-01 MED ORDER — MISOPROSTOL 200 MCG PO TABS
ORAL_TABLET | ORAL | 0 refills | Status: DC
  Filled 2024-07-01: qty 1, 1d supply, fill #0

## 2024-07-02 ENCOUNTER — Other Ambulatory Visit (HOSPITAL_COMMUNITY): Payer: Self-pay

## 2024-07-02 ENCOUNTER — Encounter (HOSPITAL_COMMUNITY): Payer: Self-pay

## 2024-07-02 ENCOUNTER — Encounter (HOSPITAL_COMMUNITY): Payer: Self-pay | Admitting: Obstetrics and Gynecology

## 2024-07-02 NOTE — Progress Notes (Signed)
 Spoke w/ via phone for pre-op interview--- [name] needs dos----    CBC per surgeon. BMP and EKG per anesthesia.     Arellano results------ COVID test -----patient states asymptomatic no test needed Arrive at -------1015 NPO after MN NO Solid Food.  Clear liquids from MN until---0915 Pre-Surgery Ensure or G2:  Med rec completed Medications to take morning of surgery ----- Norvasc  and Prilosec. Bring Albuterol  inhaler. Diabetic medication -----  GLP1 agonist last dose: GLP1 instructions:  Patient instructed no nail polish to be worn day of surgery Patient instructed to bring photo id and insurance card day of surgery Patient aware to have Driver (ride ) / caregiver    for 24 hours after surgery - Boyfriend Caron Daring Patient Special Instructions ----- Cytotec  per surgeons instructions. Pre-Op special Instructions -----  Patient verbalized understanding of instructions that were given at this phone interview. Patient denies chest pain, sob, fever, cough at the interview.

## 2024-07-03 ENCOUNTER — Other Ambulatory Visit: Payer: Self-pay | Admitting: Obstetrics and Gynecology

## 2024-07-04 ENCOUNTER — Other Ambulatory Visit: Payer: Self-pay

## 2024-07-04 ENCOUNTER — Other Ambulatory Visit (HOSPITAL_COMMUNITY): Payer: Self-pay

## 2024-07-04 ENCOUNTER — Encounter (HOSPITAL_COMMUNITY)
Admission: RE | Disposition: A | Discharge status: Home / Self Care | Payer: Self-pay | Source: Home / Self Care | Attending: Obstetrics and Gynecology

## 2024-07-04 ENCOUNTER — Ambulatory Visit (HOSPITAL_COMMUNITY): Admitting: Anesthesiology

## 2024-07-04 ENCOUNTER — Ambulatory Visit (HOSPITAL_COMMUNITY)

## 2024-07-04 ENCOUNTER — Encounter (HOSPITAL_COMMUNITY): Payer: Self-pay | Admitting: Obstetrics and Gynecology

## 2024-07-04 ENCOUNTER — Ambulatory Visit (HOSPITAL_COMMUNITY)
Admission: RE | Admit: 2024-07-04 | Discharge: 2024-07-04 | Disposition: A | Discharge status: Home / Self Care | Attending: Obstetrics and Gynecology | Admitting: Obstetrics and Gynecology

## 2024-07-04 DIAGNOSIS — N96 Recurrent pregnancy loss: Secondary | ICD-10-CM | POA: Insufficient documentation

## 2024-07-04 DIAGNOSIS — Z3A00 Weeks of gestation of pregnancy not specified: Secondary | ICD-10-CM | POA: Diagnosis not present

## 2024-07-04 DIAGNOSIS — O020 Blighted ovum and nonhydatidiform mole: Secondary | ICD-10-CM

## 2024-07-04 DIAGNOSIS — J45909 Unspecified asthma, uncomplicated: Secondary | ICD-10-CM | POA: Diagnosis not present

## 2024-07-04 DIAGNOSIS — K219 Gastro-esophageal reflux disease without esophagitis: Secondary | ICD-10-CM | POA: Insufficient documentation

## 2024-07-04 DIAGNOSIS — D259 Leiomyoma of uterus, unspecified: Secondary | ICD-10-CM | POA: Diagnosis not present

## 2024-07-04 DIAGNOSIS — I10 Essential (primary) hypertension: Secondary | ICD-10-CM | POA: Diagnosis not present

## 2024-07-04 HISTORY — DX: Gastro-esophageal reflux disease without esophagitis: K21.9

## 2024-07-04 HISTORY — PX: DILATION AND EVACUATION: SHX1459

## 2024-07-04 LAB — CBC
HCT: 40.2 % (ref 36.0–46.0)
Hemoglobin: 13.9 g/dL (ref 12.0–15.0)
MCH: 27.3 pg (ref 26.0–34.0)
MCHC: 34.6 g/dL (ref 30.0–36.0)
MCV: 78.8 fL — ABNORMAL LOW (ref 80.0–100.0)
Platelets: 362 K/uL (ref 150–400)
RBC: 5.1 MIL/uL (ref 3.87–5.11)
RDW: 13.7 % (ref 11.5–15.5)
WBC: 10 K/uL (ref 4.0–10.5)
nRBC: 0 % (ref 0.0–0.2)

## 2024-07-04 LAB — BASIC METABOLIC PANEL WITH GFR
Anion gap: 10 (ref 5–15)
BUN: 9 mg/dL (ref 6–20)
CO2: 21 mmol/L — ABNORMAL LOW (ref 22–32)
Calcium: 9.2 mg/dL (ref 8.9–10.3)
Chloride: 102 mmol/L (ref 98–111)
Creatinine, Ser: 0.82 mg/dL (ref 0.44–1.00)
GFR, Estimated: >60 mL/min (ref >60)
Glucose, Bld: 90 mg/dL (ref 70–99)
Potassium: 3.9 mmol/L (ref 3.5–5.1)
Sodium: 133 mmol/L — ABNORMAL LOW (ref 135–145)

## 2024-07-04 SURGERY — DILATION AND EVACUATION, UTERUS
Anesthesia: General | Site: Vagina

## 2024-07-04 MED ORDER — ACETAMINOPHEN 500 MG PO TABS
ORAL_TABLET | ORAL | Status: DC
Start: 2024-07-04 — End: 2024-07-04
  Filled 2024-07-04: qty 2

## 2024-07-04 MED ORDER — SODIUM CHLORIDE 0.9 % IV SOLN
INTRAVENOUS | Status: AC
  Filled 2024-07-04: qty 100

## 2024-07-04 MED ORDER — KETOROLAC TROMETHAMINE 30 MG/ML IJ SOLN
INTRAMUSCULAR | Status: DC | PRN
  Administered 2024-07-04: 30 mg via INTRAVENOUS

## 2024-07-04 MED ORDER — PROPOFOL 10 MG/ML IV BOLUS
INTRAVENOUS | Status: AC
  Filled 2024-07-04: qty 20

## 2024-07-04 MED ORDER — OXYCODONE HCL 5 MG PO TABS: 5.0000 mg | ORAL_TABLET | Freq: Once | ORAL | Status: DC | PRN

## 2024-07-04 MED ORDER — CHLORHEXIDINE GLUCONATE 0.12 % MT SOLN
OROMUCOSAL | Status: AC
  Filled 2024-07-04: qty 15

## 2024-07-04 MED ORDER — IBUPROFEN 800 MG PO TABS
800.0000 mg | ORAL_TABLET | Freq: Three times a day (TID) | ORAL | 2 refills | Status: AC | PRN
  Filled 2024-07-04: qty 30, 10d supply, fill #0

## 2024-07-04 MED ORDER — FENTANYL CITRATE (PF) 100 MCG/2ML IJ SOLN: 25.0000 ug | INTRAMUSCULAR | Status: DC | PRN

## 2024-07-04 MED ORDER — KETOROLAC TROMETHAMINE 30 MG/ML IJ SOLN
INTRAMUSCULAR | Status: AC
  Filled 2024-07-04: qty 1

## 2024-07-04 MED ORDER — 0.9 % SODIUM CHLORIDE (POUR BTL) OPTIME
TOPICAL | Status: DC | PRN
Start: 2024-07-04 — End: 2024-07-04
  Administered 2024-07-04: 1000 mL

## 2024-07-04 MED ORDER — DEXAMETHASONE SODIUM PHOSPHATE 10 MG/ML IJ SOLN
INTRAMUSCULAR | Status: AC
Start: 2024-07-04 — End: 2024-07-04
  Filled 2024-07-04: qty 1

## 2024-07-04 MED ORDER — PROPOFOL 10 MG/ML IV BOLUS
INTRAVENOUS | Status: DC | PRN
  Administered 2024-07-04: 170 mg via INTRAVENOUS

## 2024-07-04 MED ORDER — FENTANYL CITRATE (PF) 250 MCG/5ML IJ SOLN
INTRAMUSCULAR | Status: DC | PRN
  Administered 2024-07-04: 50 ug via INTRAVENOUS

## 2024-07-04 MED ORDER — ONDANSETRON HCL 4 MG/2ML IJ SOLN
INTRAMUSCULAR | Status: AC
Start: 2024-07-04 — End: 2024-07-04
  Filled 2024-07-04: qty 2

## 2024-07-04 MED ORDER — FENTANYL CITRATE (PF) 250 MCG/5ML IJ SOLN
INTRAMUSCULAR | Status: AC
  Filled 2024-07-04: qty 5

## 2024-07-04 MED ORDER — SODIUM CHLORIDE 0.9 % IV SOLN
100.0000 mg | Freq: Two times a day (BID) | INTRAVENOUS | Status: DC
  Administered 2024-07-04: 100 mg via INTRAVENOUS

## 2024-07-04 MED ORDER — CHLORHEXIDINE GLUCONATE 0.12 % MT SOLN
15.0000 mL | Freq: Once | OROMUCOSAL | Status: AC
  Administered 2024-07-04: 15 mL via OROMUCOSAL

## 2024-07-04 MED ORDER — MIDAZOLAM HCL 2 MG/2ML IJ SOLN
INTRAMUSCULAR | Status: DC | PRN
  Administered 2024-07-04: 2 mg via INTRAVENOUS

## 2024-07-04 MED ORDER — LACTATED RINGERS IV SOLN: INTRAVENOUS | Status: DC

## 2024-07-04 MED ORDER — ONDANSETRON HCL 4 MG/2ML IJ SOLN
INTRAMUSCULAR | Status: DC | PRN
  Administered 2024-07-04: 4 mg via INTRAVENOUS

## 2024-07-04 MED ORDER — AMISULPRIDE (ANTIEMETIC) 5 MG/2ML IV SOLN: 10.0000 mg | Freq: Once | INTRAVENOUS | Status: DC | PRN

## 2024-07-04 MED ORDER — POVIDONE-IODINE 10 % EX SWAB: 2.0000 | Freq: Once | CUTANEOUS | Status: DC

## 2024-07-04 MED ORDER — ACETAMINOPHEN 500 MG PO TABS
1000.0000 mg | ORAL_TABLET | Freq: Once | ORAL | Status: AC
  Administered 2024-07-04: 1000 mg via ORAL

## 2024-07-04 MED ORDER — PHENYLEPHRINE 80 MCG/ML (10ML) SYRINGE FOR IV PUSH (FOR BLOOD PRESSURE SUPPORT)
PREFILLED_SYRINGE | INTRAVENOUS | Status: DC | PRN
  Administered 2024-07-04: 160 ug via INTRAVENOUS

## 2024-07-04 MED ORDER — DEXAMETHASONE SODIUM PHOSPHATE 10 MG/ML IJ SOLN
INTRAMUSCULAR | Status: DC | PRN
  Administered 2024-07-04: 10 mg via INTRAVENOUS

## 2024-07-04 MED ORDER — LIDOCAINE 2% (20 MG/ML) 5 ML SYRINGE
INTRAMUSCULAR | Status: DC | PRN
  Administered 2024-07-04: 60 mg via INTRAVENOUS

## 2024-07-04 MED ORDER — OXYCODONE HCL 5 MG/5ML PO SOLN: 5.0000 mg | Freq: Once | ORAL | Status: DC | PRN

## 2024-07-04 MED ORDER — LIDOCAINE 2% (20 MG/ML) 5 ML SYRINGE
INTRAMUSCULAR | Status: AC
  Filled 2024-07-04: qty 5

## 2024-07-04 MED ORDER — ORAL CARE MOUTH RINSE: 15.0000 mL | Freq: Once | OROMUCOSAL | Status: AC

## 2024-07-04 MED ORDER — MIDAZOLAM HCL 2 MG/2ML IJ SOLN
INTRAMUSCULAR | Status: AC
  Filled 2024-07-04: qty 2

## 2024-07-04 SURGICAL SUPPLY — 18 items
CATH ROBINSON RED A/P 16FR (CATHETERS) ×1 IMPLANT
COVER MAYO STAND STRL (DRAPES) ×1 IMPLANT
FILTER UTR ASPR ASSEMBLY (MISCELLANEOUS) ×1 IMPLANT
GLOVE BIOGEL PI IND STRL 7.0 (GLOVE) ×2 IMPLANT
GLOVE ECLIPSE 6.5 STRL STRAW (GLOVE) ×1 IMPLANT
GOWN STRL REUS W/ TWL LRG LVL3 (GOWN DISPOSABLE) ×2 IMPLANT
GOWN STRL SURGICAL XL XLNG (GOWN DISPOSABLE) IMPLANT
HOSE CONNECTING 18IN BERKELEY (TUBING) ×1 IMPLANT
KIT BERKELEY 1ST TRI 3/8 NO TR (MISCELLANEOUS) ×1 IMPLANT
KIT BERKELEY 1ST TRIMESTER 3/8 (MISCELLANEOUS) ×1 IMPLANT
NS IRRIG 1000ML POUR BTL (IV SOLUTION) ×1 IMPLANT
PACK VAGINAL MINOR WOMEN LF (CUSTOM PROCEDURE TRAY) ×1 IMPLANT
PAD OB MATERNITY 11 LF (PERSONAL CARE ITEMS) ×1 IMPLANT
SET BERKELEY SUCTION TUBING (SUCTIONS) ×1 IMPLANT
TOWEL GREEN STERILE FF (TOWEL DISPOSABLE) ×2 IMPLANT
UNDERPAD 30X36 HEAVY ABSORB (UNDERPADS AND DIAPERS) ×1 IMPLANT
VACURETTE 8 RIGID CVD (CANNULA) IMPLANT
VACURETTE 8MM F TIP (MISCELLANEOUS) IMPLANT

## 2024-07-04 NOTE — Op Note (Signed)
 NAME: [name], [name] MEDICAL RECORD NO: [id number] ACCOUNT NO: [id number] DATE OF BIRTH: 1981/03/26 FACILITY: [facility] LOCATION: [facility] PHYSICIAN: Sade Hollon A. Rutherford, MD  Operative Report   DATE OF PROCEDURE: 07/04/2024  PREOPERATIVE DIAGNOSES: 1.  Blighted ovum. 2.  Fibroid uterus 3.  Recurrent pregnancy loss  PROCEDURE:  Ultrasound-guided suction dilation and evacuation with Anora testing.  POSTOPERATIVE DIAGNOSES: 1.  Blighted ovum. 2.  Recurrent pregnancy loss. 3.  Fibroid uterus.  ANESTHESIA:  General.  SURGEON:  Dickie Rutherford, MD.  ASSISTANT:  None.  DESCRIPTION OF PROCEDURE:  Under general anesthesia, the patient was placed in the dorsal lithotomy position.  The bladder was not emptied due to planned ultrasound.  The patient was sterilely prepped and draped in the usual fashion.  A bivalve speculum  was placed in the vagina.  A single-tooth tenaculum was placed on the anterior lip of the cervix.  Using ultrasound guidance, the cervix was serially dilated up to a #25 Pratt dilator.  A fibroid uterus with an empty sac was noted. A 7 mm curved suction cannula was inserted under ultrasound guidance. The cavity was suctioned and finally curetted  until all tissue was felt to be removed.  A palpable submucosal fibroid anteriorly could be felt on curettage.  The procedure was complete by removing all instruments from the vagina.  SPECIMEN:  Specimen labeled as products of conception sent to pathology.  A portion of tissue was sent for Anora testing.  ESTIMATED BLOOD LOSS:  5 mL.  COMPLICATIONS:  None.  DISPOSITION:  The patient tolerated the procedure well.  The patient was transferred to recovery room in stable condition.   PUS D: 07/04/2024 12:52:11 pm T: 07/04/2024 9:00:00 pm  JOB: [id number]/ [id number]

## 2024-07-04 NOTE — Anesthesia Postprocedure Evaluation (Signed)
 Anesthesia Post Note  Patient: [name]  Procedure(s) Performed: DILATION AND EVACUATION, UTERUS/ ANORA CHROMOSOME TESTING (Vagina )     Patient location during evaluation: PACU Anesthesia Type: General Level of consciousness: awake and alert Pain management: pain level controlled Vital Signs Assessment: post-procedure vital signs reviewed and stable Respiratory status: spontaneous breathing, nonlabored ventilation, respiratory function stable and patient connected to nasal cannula oxygen Cardiovascular status: blood pressure returned to baseline and stable Postop Assessment: no apparent nausea or vomiting Anesthetic complications: no   No notable events documented.  Last Vitals:  Vitals:   07/04/24 1248 07/04/24 1300  BP: 120/74 (!) 115/57  Pulse: 94 96  Resp: (!) 23 (!) 27  Temp: 36.7 C   SpO2: 100% 98%    Last Pain:  Vitals:   07/04/24 1017  TempSrc: Oral  PainSc: 0-No pain   Pain Goal: Patients Stated Pain Goal: 5 (07/04/24 1017)                 Amman Bartel L Abednego Yeates

## 2024-07-04 NOTE — Brief Op Note (Signed)
 07/04/2024  12:47 PM  PATIENT:  [name]  43 y.o. female  PRE-OPERATIVE DIAGNOSIS:  Blighted Ovum, Fibroid uterus,   POST-OPERATIVE DIAGNOSIS:  Blighted Ovum, Fibroid uterus  PROCEDURE:  ultrasound guided suction dilation and evacuation   SURGEON:  Surgeons and Role:    * Rutherford Gain, MD - Primary  PHYSICIAN ASSISTANT:   ASSISTANTS: none   ANESTHESIA:   general  EBL:  5 mL   BLOOD ADMINISTERED:none  DRAINS: none   LOCAL MEDICATIONS USED:  NONE  SPECIMEN:  Source of Specimen:  POC, anora testing  DISPOSITION OF SPECIMEN:  PATHOLOGY  COUNTS:  YES  TOURNIQUET:  * No tourniquets in log *  DICTATION: .Other Dictation: Dictation Number [id number]  PLAN OF CARE: Discharge to home after PACU  PATIENT DISPOSITION:  PACU - hemodynamically stable.   Delay start of Pharmacological VTE agent (>24hrs) due to surgical blood loss or risk of bleeding: no

## 2024-07-04 NOTE — Interval H&P Note (Signed)
 History and Physical Interval Note:  07/04/2024 11:56 AM  [name]  has presented today for surgery, with the diagnosis of Blighted Ovum, Fibroid uterus.  The various methods of treatment have been discussed with the patient and family. After consideration of risks, benefits and other options for treatment, the patient has consented to  Procedure: ultrasound guided suction dilation and evacuation with anora testing  as a surgical intervention.  The patient's history has been reviewed, patient examined, no change in status, stable for surgery.  I have reviewed the patient's chart and labs.  Questions were answered to the patient's satisfaction.     Riyan Gavina A Tymira Horkey

## 2024-07-04 NOTE — Anesthesia Procedure Notes (Signed)
 Procedure Name: LMA Insertion Date/Time: 07/04/2024 12:15 PM  Performed by: Athene Schuhmacher C, CRNAPre-anesthesia Checklist: Patient identified, Emergency Drugs available, Suction available and Patient being monitored Patient Re-evaluated:Patient Re-evaluated prior to induction Oxygen Delivery Method: Circle System Utilized Preoxygenation: Pre-oxygenation with 100% oxygen Induction Type: IV induction Ventilation: Mask ventilation without difficulty LMA: LMA inserted LMA Size: 4.0 Number of attempts: 1 Airway Equipment and Method: Bite block Placement Confirmation: positive ETCO2 Tube secured with: Tape Dental Injury: Teeth and Oropharynx as per pre-operative assessment

## 2024-07-04 NOTE — H&P (Signed)
 [name] is an 43 y.o. female. G3P0020 BF presents for u/s guided suction dilation and evacuation due to blighted ovum. Poor obstetrical hx with cervical incompetence   Pertinent Gynecological History: Menses: pregnancy Bleeding: n/a Contraception: none DES exposure: denies Blood transfusions: none Sexually transmitted diseases: no past history Previous GYN Procedures: cerclage  Last mammogram: normal Date: 10/02/2023 Last pap: normal Date: 06/2023 OB History: G3, P0   Menstrual History: Menarche age: n/a Patient's last menstrual period was 02/19/2024 (exact date).    Past Medical History:  Diagnosis Date   Abnormal Pap smear of cervix    GERD (gastroesophageal reflux disease)    Hypertension    IBS (irritable bowel syndrome) 04/13/2014   Sickle cell trait (HCC)    Vertigo     Past Surgical History:  Procedure Laterality Date   COLPOSCOPY     DILATION AND CURETTAGE OF UTERUS  2001    Family History  Problem Relation Age of Onset   Hypertension Mother    Diabetes Maternal Aunt    Hypertension Maternal Aunt    Breast cancer Neg Hx     Social History:  reports that she has never smoked. She has never used smokeless tobacco. She reports that she does not drink alcohol and does not use drugs.  Allergies:  Allergies  Allergen Reactions   Latex Itching   Lidocaine      Rash and irritation at patch application site    No medications prior to admission.    Review of Systems  All other systems reviewed and are negative.   Height 4' 11.5 (1.511 m), weight 71.2 kg, last menstrual period 02/19/2024, not currently breastfeeding. Physical Exam Constitutional:      Appearance: Normal appearance.  HENT:     Head: Atraumatic.  Eyes:     Extraocular Movements: Extraocular movements intact.  Cardiovascular:     Rate and Rhythm: Regular rhythm.     Heart sounds: Normal heart sounds.  Pulmonary:     Breath sounds: Normal breath sounds.  Abdominal:      Palpations: Abdomen is soft.  Musculoskeletal:     Cervical back: Neck supple.  Skin:    General: Skin is warm and dry.  Neurological:     General: No focal deficit present.     Mental Status: She is alert and oriented to person, place, and time.     No results found for this or any previous visit (from the past 24 hours).  No results found.  Assessment/Plan: Blighted ovum Recurrent pregnancy loss P) u/s guided suction dilation and evacuation with anora testing. Procedure explained. Risk of surgery reviewed including infection, bleeding, uterine perforation ( 12/998) and its risk, uterine scarring, retained tissue, possible need for blood transfusion and its risk. All ? answered  Wylodean Shimmel A Tashiana Lamarca 07/04/2024, 6:17 AM

## 2024-07-04 NOTE — Transfer of Care (Signed)
 Immediate Anesthesia Transfer of Care Note  Patient: [name]  Procedure(s) Performed: DILATION AND EVACUATION, UTERUS/ ANORA CHROMOSOME TESTING (Vagina )  Patient Location: PACU  Anesthesia Type:General  Level of Consciousness: awake, alert , and sedated  Airway & Oxygen Therapy: Patient Spontanous Breathing and Patient connected to face mask oxygen  Post-op Assessment: Report given to RN and Post -op Vital signs reviewed and stable  Post vital signs: Reviewed and stable  Last Vitals:  Vitals Value Taken Time  BP 120/74 07/04/24 12:48  Temp    Pulse 88 07/04/24 12:51  Resp 22 07/04/24 12:51  SpO2 97 % 07/04/24 12:51  Vitals shown include unfiled device data.  Last Pain:  Vitals:   07/04/24 1017  TempSrc: Oral  PainSc: 0-No pain      Patients Stated Pain Goal: 5 (07/04/24 1017)  Complications: No notable events documented.

## 2024-07-04 NOTE — Anesthesia Preprocedure Evaluation (Addendum)
 Anesthesia Evaluation  Patient identified by MRN, date of birth, ID band Patient awake    Reviewed: Allergy & Precautions, NPO status , Patient's Chart, lab work & pertinent test results  Airway Mallampati: II  TM Distance: >3 FB Neck ROM: Full    Dental no notable dental hx. (+) Teeth Intact, Dental Advisory Given   Pulmonary asthma    Pulmonary exam normal breath sounds clear to auscultation       Cardiovascular hypertension, Pt. on medications Normal cardiovascular exam Rhythm:Regular Rate:Normal     Neuro/Psych negative neurological ROS  negative psych ROS   GI/Hepatic Neg liver ROS,GERD  ,,  Endo/Other  negative endocrine ROS    Renal/GU negative Renal ROS  negative genitourinary   Musculoskeletal  (+) Arthritis ,    Abdominal   Peds  Hematology  (+) Blood dyscrasia, Sickle cell trait   Anesthesia Other Findings   Reproductive/Obstetrics                              Anesthesia Physical Anesthesia Plan  ASA: 2  Anesthesia Plan: General   Post-op Pain Management: Tylenol  PO (pre-op)*   Induction: Intravenous  PONV Risk Score and Plan: 3 and Ondansetron , Dexamethasone and Midazolam  Airway Management Planned: LMA  Additional Equipment:   Intra-op Plan:   Post-operative Plan: Extubation in OR  Informed Consent: I have reviewed the patients History and Physical, chart, labs and discussed the procedure including the risks, benefits and alternatives for the proposed anesthesia with the patient or authorized representative who has indicated his/her understanding and acceptance.     Dental advisory given  Plan Discussed with: CRNA  Anesthesia Plan Comments:          Anesthesia Quick Evaluation

## 2024-07-04 NOTE — Discharge Instructions (Addendum)
 DO NOT TAKE TYLENOL  UNTIL AFTER 5PM TODAY DO NOT TAKE MOTRIN  UNTIL AFTER 6:30PM TODAY     CALL  IF TEMP>100.4, NOTHING PER VAGINA X 2 WK, CALL IF SOAKING A MAXI  PAD EVERY HOUR OR MORE FREQUENTLY Post Anesthesia Home Care Instructions  Activity: Get plenty of rest for the remainder of the day. A responsible adult should stay with you for 24 hours following the procedure.  For the next 24 hours, DO NOT: -Drive a car -Advertising copywriter -Drink alcoholic beverages -Take any medication unless instructed by your physician -Make any legal decisions or sign important papers.  Meals: Start with liquid foods such as gelatin or soup. Progress to regular foods as tolerated. Avoid greasy, spicy, heavy foods. If nausea and/or vomiting occur, drink only clear liquids until the nausea and/or vomiting subsides. Call your physician if vomiting continues.  Special Instructions/Symptoms: Your throat may feel dry or sore from the anesthesia or the breathing tube placed in your throat during surgery. If this causes discomfort, gargle with warm salt water . The discomfort should disappear within 24 hours.  If you had a scopolamine patch placed behind your ear for the management of post- operative nausea and/or vomiting:  1. The medication in the patch is effective for 72 hours, after which it should be removed.  Wrap patch in a tissue and discard in the trash. Wash hands thoroughly with soap and water . 2. You may remove the patch earlier than 72 hours if you experience unpleasant side effects which may include dry mouth, dizziness or visual disturbances. 3. Avoid touching the patch. Wash your hands with soap and water  after contact with the patch.  Call your surgeon if you experience:   1.  Fever over 101.0. 2.  Inability to urinate. 3.  Nausea and/or vomiting. 4.  Extreme swelling or bruising at the surgical site. 5.  Continued bleeding from the incision. 6.  Increased pain, redness or drainage from the  incision. 7.  Problems related to your pain medication. 8. Any change in color, movement and/or sensation 9. Any problems and/or concerns

## 2024-07-05 ENCOUNTER — Encounter (HOSPITAL_COMMUNITY): Payer: Self-pay | Admitting: Obstetrics and Gynecology

## 2024-07-07 LAB — SURGICAL PATHOLOGY

## 2024-07-08 ENCOUNTER — Encounter: Payer: Self-pay | Admitting: Family Medicine

## 2024-07-08 ENCOUNTER — Ambulatory Visit: Admitting: Family Medicine

## 2024-07-08 ENCOUNTER — Telehealth: Payer: Self-pay | Admitting: Family Medicine

## 2024-07-08 ENCOUNTER — Other Ambulatory Visit (HOSPITAL_COMMUNITY): Payer: Self-pay

## 2024-07-08 VITALS — BP 120/80 | HR 88 | Resp 12 | Ht 59.5 in | Wt 160.2 lb

## 2024-07-08 DIAGNOSIS — F411 Generalized anxiety disorder: Secondary | ICD-10-CM | POA: Diagnosis not present

## 2024-07-08 DIAGNOSIS — N96 Recurrent pregnancy loss: Secondary | ICD-10-CM

## 2024-07-08 MED ORDER — SERTRALINE HCL 25 MG PO TABS
25.0000 mg | ORAL_TABLET | Freq: Every day | ORAL | 0 refills | Status: DC
  Filled 2024-07-08: qty 30, 30d supply, fill #0

## 2024-07-08 NOTE — Progress Notes (Signed)
 ACUTE VISIT Chief Complaint  Patient presents with   Stress    Had d&c done on Friday, increased stress due to loss of baby & OB only took pt out of work for a week. Requesting more time, as she is still recovering.    HPI: [name] is a 43 y.o. female, who is here today with her family member complaining of increased stress as described above.  She reports having her 4th miscarriage, noted Tuesday 7/22.  Per pt report, pregnancy loss was confirmed that day at her OBGYN appt when no fetal heartbeat was detected on exam.  She was about [redacted] weeks pregnant at the time.  She had dilation and evacuation procedure done Friday 7/25 and since then continues to experience lower abdominal cramps and vaginal bleeding. Currently taking Ibuprofen  800 mg every 4 hours for pain management.  Negative for fever, chills, or skin rash. Hx of uterine fibroids.  Lab Results  Component Value Date   WBC 10.0 07/04/2024   HGB 13.9 07/04/2024   HCT 40.2 07/04/2024   MCV 78.8 (L) 07/04/2024   PLT 362 07/04/2024   Her last day of work was 7/22.  Her OBGYN excused her from work from 7/23-8/3, with her returning to work on 8/4. She is concerned that given her mental state, she will be unable to start work next Monday.  No hx of anxiety and depression.  Pt works in the kitchen at the [employer] and is also concerned about physical limitations with daily tasks at work, such as lifting heavy objects, given she is still bleeding.  She does not currently have a f/u appt with her OBGYN or PCP and is requesting more time off of work.   On average she is sleeping 8 hrs/night, and states she wakes up several times throughout the night.  Has not tried any medications for anxiety and depression in the past.   Review of Systems  Constitutional:  Negative for activity change and appetite change.  Respiratory:  Negative for cough and shortness of breath.   Cardiovascular:  Negative for chest  pain, palpitations and leg swelling.  Gastrointestinal:  Negative for nausea and vomiting.  Endocrine: Negative for cold intolerance and heat intolerance.  Genitourinary:  Negative for decreased urine volume, dysuria and hematuria.  Neurological:  Negative for syncope, weakness and headaches.  See other pertinent positives and negatives in HPI.  Current Outpatient Medications on File Prior to Visit  Medication Sig Dispense Refill   albuterol  (PROAIR  HFA) 108 (90 Base) MCG/ACT inhaler Inhale 2 puffs into the lungs every 6 (six) hours as needed for wheezing or shortness of breath. 18 g 2   amLODipine  (NORVASC ) 2.5 MG tablet Take 1 tablet (2.5 mg total) by mouth daily. 90 tablet 1   Cholecalciferol  (VITAMIN D3) 50 MCG (2000 UT) capsule Take 1 capsule (2,000 Units total) by mouth daily. 30 capsule 5   fluticasone  (FLONASE ) 50 MCG/ACT nasal spray USE 1 SPRAY INTO BOTH NOSTRILS DAILY. 16 g 5   ibuprofen  (ADVIL ) 800 MG tablet Take 1 tablet (800 mg total) by mouth every 8 (eight) hours as needed. 30 tablet 2   meclizine  (ANTIVERT ) 25 MG tablet Take 2 tablets (50 mg total) by mouth 3 (three) times daily as needed. 30 tablet 0   methocarbamol  (ROBAXIN ) 500 MG tablet Take 1 tablet (500 mg total) by mouth 2 (two) times daily. 20 tablet 0   montelukast  (SINGULAIR ) 10 MG tablet Take 1 tablet (10 mg total)  by mouth at bedtime. 90 tablet 1   omeprazole  (PRILOSEC) 20 MG capsule Take 1 capsule (20 mg total) by mouth daily. 90 capsule 1   Prenatal Vit-Fe Fumarate-FA (PRENATAL MULTIVITAMIN) TABS tablet Take 1 tablet by mouth daily at 12 noon.     No current facility-administered medications on file prior to visit.    Past Medical History:  Diagnosis Date   Abnormal Pap smear of cervix    GERD (gastroesophageal reflux disease)    Hypertension    IBS (irritable bowel syndrome) 04/13/2014   Sickle cell trait (HCC)    Vertigo    Allergies  Allergen Reactions   Latex Itching   Lidocaine      Rash and  irritation at patch application site    Social History   Socioeconomic History   Marital status: Single    Spouse name: Not on file   Number of children: Not on file   Years of education: Not on file   Highest education level: Not on file  Occupational History   Not on file  Tobacco Use   Smoking status: Never   Smokeless tobacco: Never  Vaping Use   Vaping status: Never Used  Substance and Sexual Activity   Alcohol use: No   Drug use: No   Sexual activity: Yes    Partners: Male    Birth control/protection: None    Comment: 1st intercourse- 17, partners- 4, current partner-  7 yrs  Other Topics Concern   Not on file  Social History Narrative   Work or School: works as Conservation officer, nature at [employer] Situation: lives alone      Spiritual Beliefs:Christian      Lifestyle: no regular CV exercise;  Diet is not great            Social Drivers of Corporate investment banker Strain: Not on file  Food Insecurity: Not on file  Transportation Needs: Not on file  Physical Activity: Not on file  Stress: Not on file  Social Connections: Not on file   Vitals:   07/08/24 1106  BP: 120/80  Pulse: 88  Resp: 12  SpO2: 98%   Body mass index is 31.82 kg/m.  Physical Exam Vitals and nursing note reviewed.  Constitutional:      General: She is not in acute distress.    Appearance: She is well-developed.  HENT:     Head: Normocephalic and atraumatic.  Eyes:     Conjunctiva/sclera: Conjunctivae normal.  Cardiovascular:     Rate and Rhythm: Normal rate and regular rhythm.     Heart sounds: No murmur heard. Pulmonary:     Effort: Pulmonary effort is normal. No respiratory distress.     Breath sounds: Normal breath sounds.  Abdominal:     Palpations: Abdomen is soft. There is no mass.     Tenderness: There is no abdominal tenderness.  Skin:    General: Skin is warm.     Findings: No erythema or rash.  Neurological:     General: No focal deficit present.      Mental Status: She is alert and oriented to person, place, and time.     Gait: Gait normal.  Psychiatric:        Mood and Affect: Mood and affect normal.        Thought Content: Thought content does not include homicidal or suicidal ideation. Thought content does not include homicidal or suicidal plan.   ASSESSMENT AND  PLAN: [name] was seen today for stress.   Recurrent pregnancy loss  S/P US  guided suction dilation and evacuation. Still having some pelvic cramps and vaginal bleeding. Currently on Ibuprofen  800 mg. Following with gynecologist. According to pt, she is supposed to go back to work 07/14/24, requesting leave to be extended. Excuse letter provided to go back 07/21/24.  Anxiety state Aggravated by above event. We discussed the option of pharmacologic treatment to take for a few months. She agrees with trying Sertraline  25 mg daily, side effects discussed as well as the risk of interaction with NSAID's.She would like to follow up with her PCP, Dr. Ozell, prior to being referred for counseling.  F/U with PCP in 2 weeks, before if needed.  -     Sertraline  HCl; Take 1 tablet (25 mg total) by mouth daily.  Dispense: 30 tablet; Refill: 0  I spent a total of 30 minutes in both face to face and non face to face activities for this visit on the date of this encounter. During this time history was obtained and documented, examination was performed, prior labs reviewed, and assessment/plan discussed.  Return in about 2 weeks (around 07/22/2024).  I, [name] Forest, acting as a scribe for Marjie Chea [place], MD., have documented all relevant documentation on the behalf of Lamya Lausch [place], MD, as directed by   while in the presence of Kizzie Cotten [place], MD.  I, Nirvan Laban [place], MD, have reviewed all documentation for this visit. The documentation on 07/08/24 for the exam, diagnosis, procedures, and orders are all accurate and complete.  Leng Montesdeoca G. [place], MD  [facility]. [place] office.

## 2024-07-08 NOTE — Patient Instructions (Addendum)
 A few things to remember from today's visit:  History of recurrent miscarriages  Anxiety state - Plan: sertraline  (ZOLOFT ) 25 MG tablet  Sertraline  25 mg daily to help with anxiety. Letter provide to go back 07/21/24. If more time feel like needed you have to get it from Dr Ozell. Please arrange a 2 weeks follow up.  If you need refills for medications you take chronically, please call your pharmacy. Do not use My Chart to request refills or for acute issues that need immediate attention. If you send a my chart message, it may take a few days to be addressed, specially if I am not in the office.  Please be sure medication list is accurate. If a new problem present, please set up appointment sooner than planned today.

## 2024-07-08 NOTE — Telephone Encounter (Signed)
 Copied from CRM #[id number]. Topic: General - Other >> Jul 08, 2024  2:53 PM Gennette ORN wrote: Reason for CRM: Patient is calling to let us  know her FMLA papers are coming [employer].

## 2024-07-08 NOTE — Telephone Encounter (Signed)
 Noted, they have already been filled out & waiting on provider signature.

## 2024-07-10 ENCOUNTER — Telehealth: Payer: Self-pay | Admitting: *Deleted

## 2024-07-10 LAB — ANORA MISCARRIAGE TEST - FRESH

## 2024-07-10 NOTE — Telephone Encounter (Signed)
 Patient called to check on the status of FMLA paperwork and would like to know if paperwork was completed(signed) and faxed to [employer] as requested. Per CAL the nurse will reach out to patient. Patient made aware.   CB#[phone]

## 2024-07-10 NOTE — Telephone Encounter (Signed)
 Copied from CRM #[id number]. Topic: General - Other >> Jul 08, 2024  2:53 PM Gennette ORN wrote: Reason for CRM: Patient is calling to let us  know her FMLA papers are coming [employer]. >> Jul 10, 2024  1:24 PM Rosina D wrote: Patient called stating she is checking on FMLA paper work that MD [place] need to fill out for her job. Patient stated it was not MD Ozell but MD [place] that need to sign the form because she was the one who took her out of work when she saw her CB [phone]

## 2024-07-10 NOTE — Telephone Encounter (Signed)
 See prior phone note-forwarded to Dr Gib teamcare.

## 2024-07-11 NOTE — Telephone Encounter (Signed)
 Forms have been signed. Thanks, BJ

## 2024-07-11 NOTE — Telephone Encounter (Signed)
 Patient is aware forms have been faxed in. Copy sent to scan.

## 2024-07-15 ENCOUNTER — Other Ambulatory Visit (HOSPITAL_COMMUNITY): Payer: Self-pay

## 2024-07-15 NOTE — Telephone Encounter (Unsigned)
 Copied from CRM #[id number]. Topic: General - Other >> Jul 15, 2024 10:07 AM Sophia H wrote: Reason for CRM: Patient is stating that FMLA paperwork was faxed over and filled out incorrectly/not at all (per patient) and she is needing this fixed asap. States her time off was supposed to be extended from the 29th of July to the 11th of August, saw Dr. [place] on the 29th of July. Please reach out to patient to get resolved. Patient upset about this # [phone]

## 2024-07-15 NOTE — Telephone Encounter (Signed)
 I called and spoke with pt. Her OB covered 7/29-8/4. Pt asked for time to be extended from 8/11 to 8/12 as that would be the day she returns back to work. Paperwork updated and faxed back to [employer]. Patient is aware.

## 2024-07-25 ENCOUNTER — Ambulatory Visit: Admitting: Family Medicine

## 2024-08-18 ENCOUNTER — Other Ambulatory Visit (HOSPITAL_COMMUNITY): Payer: Self-pay

## 2024-08-18 ENCOUNTER — Ambulatory Visit: Payer: Self-pay | Admitting: Family Medicine

## 2024-08-18 ENCOUNTER — Ambulatory Visit (INDEPENDENT_AMBULATORY_CARE_PROVIDER_SITE_OTHER): Admitting: Family Medicine

## 2024-08-18 ENCOUNTER — Other Ambulatory Visit: Payer: Self-pay

## 2024-08-18 ENCOUNTER — Encounter: Payer: Self-pay | Admitting: Family Medicine

## 2024-08-18 VITALS — BP 116/62 | HR 70 | Temp 98.5°F | Ht 60.0 in | Wt 164.2 lb

## 2024-08-18 DIAGNOSIS — R739 Hyperglycemia, unspecified: Secondary | ICD-10-CM

## 2024-08-18 DIAGNOSIS — I10 Essential (primary) hypertension: Secondary | ICD-10-CM

## 2024-08-18 DIAGNOSIS — Z0000 Encounter for general adult medical examination without abnormal findings: Secondary | ICD-10-CM | POA: Diagnosis not present

## 2024-08-18 DIAGNOSIS — K219 Gastro-esophageal reflux disease without esophagitis: Secondary | ICD-10-CM

## 2024-08-18 DIAGNOSIS — Z13220 Encounter for screening for lipoid disorders: Secondary | ICD-10-CM | POA: Diagnosis not present

## 2024-08-18 DIAGNOSIS — E559 Vitamin D deficiency, unspecified: Secondary | ICD-10-CM

## 2024-08-18 DIAGNOSIS — Z1231 Encounter for screening mammogram for malignant neoplasm of breast: Secondary | ICD-10-CM

## 2024-08-18 LAB — LIPID PANEL
Cholesterol: 167 mg/dL (ref 0–200)
HDL: 54.4 mg/dL (ref >39.00)
LDL Cholesterol: 91 mg/dL (ref 0–99)
NonHDL: 112.21
Total CHOL/HDL Ratio: 3
Triglycerides: 106 mg/dL (ref 0.0–149.0)
VLDL: 21.2 mg/dL (ref 0.0–40.0)

## 2024-08-18 LAB — COMPREHENSIVE METABOLIC PANEL WITH GFR
ALT: 14 U/L (ref 0–35)
AST: 15 U/L (ref 0–37)
Albumin: 4.1 g/dL (ref 3.5–5.2)
Alkaline Phosphatase: 76 U/L (ref 39–117)
BUN: 13 mg/dL (ref 6–23)
CO2: 27 meq/L (ref 19–32)
Calcium: 9.6 mg/dL (ref 8.4–10.5)
Chloride: 102 meq/L (ref 96–112)
Creatinine, Ser: 0.97 mg/dL (ref 0.40–1.20)
GFR: 71.89 mL/min (ref >60.00)
Glucose, Bld: 91 mg/dL (ref 70–99)
Potassium: 4.4 meq/L (ref 3.5–5.1)
Sodium: 137 meq/L (ref 135–145)
Total Bilirubin: 0.2 mg/dL (ref 0.2–1.2)
Total Protein: 7.3 g/dL (ref 6.0–8.3)

## 2024-08-18 LAB — HEMOGLOBIN A1C: Hgb A1c MFr Bld: 6.5 % (ref 4.6–6.5)

## 2024-08-18 LAB — VITAMIN D 25 HYDROXY (VIT D DEFICIENCY, FRACTURES): VITD: 57.87 ng/mL (ref 30.00–100.00)

## 2024-08-18 MED ORDER — OMEPRAZOLE 20 MG PO CPDR
20.0000 mg | DELAYED_RELEASE_CAPSULE | Freq: Every day | ORAL | 1 refills | Status: AC
  Filled 2024-08-18: qty 90, 90d supply, fill #0

## 2024-08-18 MED ORDER — AMLODIPINE BESYLATE 2.5 MG PO TABS
2.5000 mg | ORAL_TABLET | Freq: Every day | ORAL | 1 refills | Status: AC
  Filled 2024-08-18 – 2024-09-25 (×3): qty 90, 90d supply, fill #0
  Filled 2024-12-29: qty 90, 90d supply, fill #1

## 2024-08-18 NOTE — Progress Notes (Signed)
 Complete physical exam  Patient: [name]   DOB: January 10, 1981   43 y.o. Female  MRN: [id number]  Subjective:    Chief Complaint  Patient presents with   Annual Exam    [name] is a 43 y.o. female who presents today for a complete physical exam. She reports consuming a general and low dairy diet. Tries to stay away from fast food, cooks a lot at home, does eat fruits and veggies, maybe not every day. The patient has a physically strenuous job, but has no regular exercise apart from work.  She generally feels well. She reports sleeping well. She does not have additional problems to discuss today.    Most recent fall risk assessment:    06/25/2023   11:33 AM  Fall Risk   Falls in the past year? 0  Number falls in past yr: 0  Injury with Fall? 0  Risk for fall due to : No Fall Risks  Follow up Falls evaluation completed     Most recent depression screenings:    08/18/2024    8:55 AM 02/25/2024    2:20 PM  PHQ 2/9 Scores  PHQ - 2 Score 2 0  PHQ- 9 Score 2 1    Vision:Not within last year  and no vision issues and Dental: No current dental problems and Receives regular dental care  Patient Active Problem List   Diagnosis Date Noted   Abdominal pain in early pregnancy 06/14/2024   Allergic rhinitis 02/13/2023   Vertigo, peripheral, left 07/06/2022   Vitamin D  deficiency 07/06/2022   Hypertension 02/17/2022   Lactose intolerance 06/27/2019   GERD (gastroesophageal reflux disease) 06/27/2019   Environmental allergies 11/15/2018   Bronchitis 11/15/2018   Cough 11/15/2018   Chronic pain of right ankle 08/06/2017   Localized osteoarthritis of right ankle 08/06/2017   Syncope 11/25/2014   Leiomyoma of uterus 04/13/2014   Amenorrhea due to Depo Provera 04/13/2014      Patient Care Team: Ozell Heron CHRISTELLA, MD as PCP - General (Family Medicine) Lavoie, Marie-Lyne, MD as Consulting Physician (Obstetrics and Gynecology)   Outpatient Medications  Prior to Visit  Medication Sig   albuterol  (PROAIR  HFA) 108 (90 Base) MCG/ACT inhaler Inhale 2 puffs into the lungs every 6 (six) hours as needed for wheezing or shortness of breath.   Cholecalciferol  (VITAMIN D3) 50 MCG (2000 UT) capsule Take 1 capsule (2,000 Units total) by mouth daily.   fluticasone  (FLONASE ) 50 MCG/ACT nasal spray USE 1 SPRAY INTO BOTH NOSTRILS DAILY.   ibuprofen  (ADVIL ) 800 MG tablet Take 1 tablet (800 mg total) by mouth every 8 (eight) hours as needed.   meclizine  (ANTIVERT ) 25 MG tablet Take 2 tablets (50 mg total) by mouth 3 (three) times daily as needed.   methocarbamol  (ROBAXIN ) 500 MG tablet Take 1 tablet (500 mg total) by mouth 2 (two) times daily.   montelukast  (SINGULAIR ) 10 MG tablet Take 1 tablet (10 mg total) by mouth at bedtime.   Prenatal Vit-Fe Fumarate-FA (PRENATAL MULTIVITAMIN) TABS tablet Take 1 tablet by mouth daily at 12 noon.   [DISCONTINUED] amLODipine  (NORVASC ) 2.5 MG tablet Take 1 tablet (2.5 mg total) by mouth daily.   [DISCONTINUED] omeprazole  (PRILOSEC) 20 MG capsule Take 1 capsule (20 mg total) by mouth daily.   [DISCONTINUED] sertraline  (ZOLOFT ) 25 MG tablet Take 1 tablet (25 mg total) by mouth daily.   No facility-administered medications prior to visit.    Review of Systems  HENT:  Negative for  hearing loss.   Eyes:  Negative for blurred vision.  Respiratory:  Negative for shortness of breath.   Cardiovascular:  Negative for chest pain.  Gastrointestinal: Negative.   Genitourinary: Negative.   Musculoskeletal:  Negative for back pain.  Neurological:  Negative for headaches.  Psychiatric/Behavioral:  Negative for depression.        Objective:     BP 116/62   Pulse 70   Temp 98.5 F (36.9 C) (Oral)   Ht 5' (1.524 m)   Wt 164 lb 3.2 oz (74.5 kg)   LMP 08/09/2024 (Exact Date)   SpO2 99%   BMI 32.07 kg/m    Physical Exam Vitals reviewed.  Constitutional:      Appearance: Normal appearance. She is well-groomed. She is  obese.  HENT:     Right Ear: Tympanic membrane and ear canal normal.     Left Ear: Tympanic membrane and ear canal normal.     Mouth/Throat:     Mouth: Mucous membranes are moist.     Pharynx: No posterior oropharyngeal erythema.  Eyes:     Conjunctiva/sclera: Conjunctivae normal.  Neck:     Thyroid : No thyromegaly.  Cardiovascular:     Rate and Rhythm: Normal rate and regular rhythm.     Pulses: Normal pulses.     Heart sounds: S1 normal and S2 normal.  Pulmonary:     Effort: Pulmonary effort is normal.     Breath sounds: Normal breath sounds and air entry.  Abdominal:     General: Abdomen is flat. Bowel sounds are normal.     Palpations: Abdomen is soft.  Musculoskeletal:     Right lower leg: No edema.     Left lower leg: No edema.  Lymphadenopathy:     Cervical: No cervical adenopathy.  Neurological:     Mental Status: She is alert and oriented to person, place, and time. Mental status is at baseline.     Gait: Gait is intact.  Psychiatric:        Mood and Affect: Mood and affect normal.        Speech: Speech normal.        Behavior: Behavior normal.        Judgment: Judgment normal.      No results found for any visits on 08/18/24.     Assessment & Plan:    Routine Health Maintenance and Physical Exam  Immunization History  Administered Date(s) Administered   Dtap, Unspecified 05/11/1982, 09/19/1982, 02/27/1983, 06/08/1987   Influenza,inj,Quad PF,6+ Mos 08/12/2021, 08/15/2022   Influenza-Unspecified 08/11/2017, 08/11/2018, 08/15/2022   MMR 02/27/1983   PFIZER(Purple Top)SARS-COV-2 Vaccination 02/27/2020, 03/12/2020   Polio, Unspecified 05/11/1982, 09/19/1982, 02/27/1983, 06/08/1987   Tdap 08/12/2021    Health Maintenance  Topic Date Due   Pneumococcal Vaccine (1 of 2 - PCV) Never done   Hepatitis B Vaccines 19-59 Average Risk (1 of 3 - 19+ 3-dose series) Never done   HPV VACCINES (1 - 3-dose SCDM series) Never done   COVID-19 Vaccine (3 - 2025-26  season) 08/11/2024   Influenza Vaccine  03/10/2025 (Originally 07/11/2024)   Cervical Cancer Screening (HPV/Pap Cotest)  06/24/2028   DTaP/Tdap/Td (6 - Td or Tdap) 08/13/2031   Hepatitis C Screening  Completed   HIV Screening  Completed   Meningococcal B Vaccine  Aged Out    Discussed health benefits of physical activity, and encouraged her to engage in regular exercise appropriate for her age and condition.  Hypertension, unspecified type -  amLODIPine  Besylate; Take 1 tablet (2.5 mg total) by mouth daily.  Dispense: 90 tablet; Refill: 1 -     Comprehensive metabolic panel with GFR; Future  Gastroesophageal reflux disease, unspecified whether esophagitis present -     Omeprazole ; Take 1 capsule (20 mg total) by mouth daily.  Dispense: 90 capsule; Refill: 1  Lipid screening -     Lipid panel; Future  Vitamin D  deficiency -     VITAMIN D  25 Hydroxy (Vit-D Deficiency, Fractures); Future  Hyperglycemia -     Hemoglobin A1c; Future  Routine general medical examination at a health care facility  Breast cancer screening by mammogram -     3D Screening Mammogram, Left and Right; Future  General physical exam findings are normal today. I reviewed the patient's preventative testing, immunizations, and lifestyle habits. I made appropriate recommendations and placed orders for the appropriate tests and/or vaccinations. I counseled the patient on the CDC's recommendations for healthy exercise and diet. I counseled the patient on healthy sleep habits and stress management. Handouts to reinforce the counseling were given at the conclusion of the visit.    Return in 6 months (on 02/15/2025).     Heron CHRISTELLA Sharper, MD

## 2024-08-18 NOTE — Patient Instructions (Signed)

## 2024-09-24 ENCOUNTER — Other Ambulatory Visit (HOSPITAL_COMMUNITY): Payer: Self-pay

## 2024-09-25 ENCOUNTER — Other Ambulatory Visit (HOSPITAL_COMMUNITY): Payer: Self-pay

## 2024-09-25 ENCOUNTER — Other Ambulatory Visit: Payer: Self-pay

## 2024-09-30 ENCOUNTER — Encounter: Payer: Self-pay | Admitting: Obstetrics and Gynecology

## 2024-09-30 ENCOUNTER — Ambulatory Visit: Admitting: Obstetrics and Gynecology

## 2024-09-30 VITALS — BP 126/82 | HR 86

## 2024-09-30 DIAGNOSIS — N883 Incompetence of cervix uteri: Secondary | ICD-10-CM

## 2024-09-30 DIAGNOSIS — D219 Benign neoplasm of connective and other soft tissue, unspecified: Secondary | ICD-10-CM | POA: Diagnosis not present

## 2024-09-30 DIAGNOSIS — N920 Excessive and frequent menstruation with regular cycle: Secondary | ICD-10-CM

## 2024-09-30 DIAGNOSIS — Z23 Encounter for immunization: Secondary | ICD-10-CM

## 2024-09-30 NOTE — Progress Notes (Signed)
 43 y.o. y.o. female here for fertility consult No LMP recorded. She is a patient of Dr. Yalcinkaya and has done blood there   D&C in July, recurrent sab, carries to about 20 wks Since the age of 11 until recently 7/25 tri 13 on [employer] of conception tissue  Periods are heavy and she can push on her lower abdomen and feel the fibroids, and she illicits urinary symptoms. Bleeds q month for 3-4 days and is using 2-3 pads a day. She has clots and standing she has more blood come out. She reports having a SHG done in [place] last July. She has a history of abnormal pap smears: Gardesil: counseled on importance #1 of 3 given today MMG schedule\d 10/02/24  There is no height or weight on file to calculate BMI.  OB History     Gravida  4   Para  1   Term  0   Preterm  1   AB  2   Living  0      SAB  2   IAB      Ectopic      Multiple      Live Births  0        Obstetric Comments  All 3 babies were stillborn           Blood pressure 126/82, pulse 86, SpO2 98%.     Component Value Date/Time   DIAGPAP (A) 06/25/2023 1146    - Atypical squamous cells of undetermined significance (ASC-US )   DIAGPAP - Low grade squamous intraepithelial lesion (LSIL) (A) 10/06/2022 1151   DIAGPAP  03/03/2022 0851    - Negative for Intraepithelial Lesions or Malignancy (NILM)   DIAGPAP - Benign reactive/reparative changes 03/03/2022 0851   HPVHIGH Positive (A) 06/25/2023 1146   HPVHIGH Negative 12/22/2022 1548   HPVHIGH Positive (A) 08/25/2021 1121   ADEQPAP  06/25/2023 1146    Satisfactory for evaluation; transformation zone component PRESENT.   ADEQPAP  10/06/2022 1151    Satisfactory for evaluation; transformation zone component PRESENT.   ADEQPAP  03/03/2022 0851    Satisfactory for evaluation; transformation zone component PRESENT.    GYN HISTORY:    Component Value Date/Time   DIAGPAP (A) 06/25/2023 1146    - Atypical squamous cells of undetermined  significance (ASC-US )   DIAGPAP - Low grade squamous intraepithelial lesion (LSIL) (A) 10/06/2022 1151   DIAGPAP  03/03/2022 0851    - Negative for Intraepithelial Lesions or Malignancy (NILM)   DIAGPAP - Benign reactive/reparative changes 03/03/2022 0851   HPVHIGH Positive (A) 06/25/2023 1146   HPVHIGH Negative 12/22/2022 1548   HPVHIGH Positive (A) 08/25/2021 1121   ADEQPAP  06/25/2023 1146    Satisfactory for evaluation; transformation zone component PRESENT.   ADEQPAP  10/06/2022 1151    Satisfactory for evaluation; transformation zone component PRESENT.   ADEQPAP  03/03/2022 0851    Satisfactory for evaluation; transformation zone component PRESENT.    OB History  Gravida Para Term Preterm AB Living  4 1 0 1 2 0  SAB IAB Ectopic Multiple Live Births  2    0    # Outcome Date GA Lbr Len/2nd Weight Sex Type Anes PTL Lv  4 Gravida           3 Preterm 2010 [redacted]w[redacted]d   M Vag-Spont        Birth Comments: cerclage, placenta previa  2 SAB 2003 [redacted]w[redacted]d   F      1  SAB 2001 [redacted]w[redacted]d   M        Obstetric Comments  All 3 babies were stillborn    Past Medical History:  Diagnosis Date   Abnormal Pap smear of cervix    GERD (gastroesophageal reflux disease)    Hypertension    IBS (irritable bowel syndrome) 04/13/2014   Sickle cell trait    Vertigo     Past Surgical History:  Procedure Laterality Date   COLPOSCOPY     DILATION AND CURETTAGE OF UTERUS  2001   DILATION AND EVACUATION N/A 07/04/2024   Procedure: DILATION AND EVACUATION, UTERUS/ ANORA CHROMOSOME TESTING;  Surgeon: Rutherford Gain, MD;  Location: [facility] OR;  Service: Gynecology;  Laterality: N/A;    Current Outpatient Medications on File Prior to Visit  Medication Sig Dispense Refill   albuterol  (PROAIR  HFA) 108 (90 Base) MCG/ACT inhaler Inhale 2 puffs into the lungs every 6 (six) hours as needed for wheezing or shortness of breath. 18 g 2   amLODipine  (NORVASC ) 2.5 MG tablet Take 1 tablet (2.5 mg total) by mouth daily. 90  tablet 1   Cholecalciferol  (VITAMIN D3) 50 MCG (2000 UT) capsule Take 1 capsule (2,000 Units total) by mouth daily. 30 capsule 5   fluticasone  (FLONASE ) 50 MCG/ACT nasal spray USE 1 SPRAY INTO BOTH NOSTRILS DAILY. 16 g 5   ibuprofen  (ADVIL ) 800 MG tablet Take 1 tablet (800 mg total) by mouth every 8 (eight) hours as needed. 30 tablet 2   meclizine  (ANTIVERT ) 25 MG tablet Take 2 tablets (50 mg total) by mouth 3 (three) times daily as needed. 30 tablet 0   methocarbamol  (ROBAXIN ) 500 MG tablet Take 1 tablet (500 mg total) by mouth 2 (two) times daily. 20 tablet 0   montelukast  (SINGULAIR ) 10 MG tablet Take 1 tablet (10 mg total) by mouth at bedtime. 90 tablet 1   omeprazole  (PRILOSEC) 20 MG capsule Take 1 capsule (20 mg total) by mouth daily. 90 capsule 1   Prenatal Vit-Fe Fumarate-FA (PRENATAL MULTIVITAMIN) TABS tablet Take 1 tablet by mouth daily at 12 noon.     No current facility-administered medications on file prior to visit.    Social History   Socioeconomic History   Marital status: Single    Spouse name: Not on file   Number of children: Not on file   Years of education: Not on file   Highest education level: Not on file  Occupational History   Not on file  Tobacco Use   Smoking status: Never   Smokeless tobacco: Never  Vaping Use   Vaping status: Never Used  Substance and Sexual Activity   Alcohol use: No   Drug use: No   Sexual activity: Yes    Partners: Male    Birth control/protection: None    Comment: 1st intercourse- 1, partners- 4, current partner-  7 yrs  Other Topics Concern   Not on file  Social History Narrative   Work or School: works as Conservation officer, nature at [employer] Situation: lives alone      Spiritual Beliefs:Christian      Lifestyle: no regular CV exercise;  Diet is not great            Social Drivers of Corporate investment banker Strain: Not on file  Food Insecurity: Not on file  Transportation Needs: Not on file  Physical Activity:  Not on file  Stress: Not on file  Social Connections: Not on file  Intimate  Partner Violence: Not on file    Family History  Problem Relation Age of Onset   Hypertension Mother    Diabetes Maternal Aunt    Hypertension Maternal Aunt    Breast cancer Neg Hx      Allergies  Allergen Reactions   Latex Itching   Lidocaine      Rash and irritation at patch application site      Patient's last menstrual period was No LMP recorded..            Review of Systems Alls systems reviewed and are negative.     OBGyn Exam    A:                      Recurrent SAB at 20 wks Likely incompetent cervix AMA Sickle cell trait Symptomatic fibroids menorrhagia                P:        discussed options with myomectomy and possible combined case with Dr. Yalcinkaya for abdominal cerclage. To send to surgery scheduling, since this will take some time to coordinate and patient agreed. The r/b/a/I of the myomectomy were discussed. Encouraged IVF for PGS testing and reduce miscarriage rate, disadvantage is cost.  To get SHG and PUS CD 7-10 to get an idea about the fibroids and submucosal fibroids Gardesil given today Needs annual exam  40 minutes spent on reviewing records, imaging,  and one on one patient time and counseling patient and documentation Dr. Glennon  No follow-ups on file.  Almarie MARLA Glennon

## 2024-10-02 ENCOUNTER — Ambulatory Visit
Admission: RE | Admit: 2024-10-02 | Discharge: 2024-10-02 | Disposition: A | Source: Ambulatory Visit | Attending: Family Medicine

## 2024-10-02 DIAGNOSIS — Z1231 Encounter for screening mammogram for malignant neoplasm of breast: Secondary | ICD-10-CM

## 2024-10-07 ENCOUNTER — Other Ambulatory Visit: Payer: Self-pay | Admitting: Family Medicine

## 2024-10-07 DIAGNOSIS — R928 Other abnormal and inconclusive findings on diagnostic imaging of breast: Secondary | ICD-10-CM

## 2024-10-13 ENCOUNTER — Ambulatory Visit
Admission: RE | Admit: 2024-10-13 | Discharge: 2024-10-13 | Disposition: A | Discharge status: Home / Self Care | Source: Ambulatory Visit | Attending: Family Medicine | Admitting: Family Medicine

## 2024-10-13 ENCOUNTER — Ambulatory Visit: Payer: Self-pay | Admitting: Family Medicine

## 2024-10-13 DIAGNOSIS — R928 Other abnormal and inconclusive findings on diagnostic imaging of breast: Secondary | ICD-10-CM

## 2024-10-13 DIAGNOSIS — N6489 Other specified disorders of breast: Secondary | ICD-10-CM | POA: Diagnosis not present

## 2024-12-01 ENCOUNTER — Ambulatory Visit

## 2024-12-01 DIAGNOSIS — Z23 Encounter for immunization: Secondary | ICD-10-CM | POA: Diagnosis not present

## 2024-12-01 NOTE — Progress Notes (Signed)
 12/01/24:  Gardasil #2 - IM in right arm. (IC, [facility])

## 2024-12-01 NOTE — Patient Instructions (Signed)
 12/01/24:  Gardasil #2 - IM in right arm. ([facility], [facility])

## 2024-12-29 ENCOUNTER — Other Ambulatory Visit (HOSPITAL_COMMUNITY): Payer: Self-pay

## 2025-01-15 ENCOUNTER — Ambulatory Visit: Admitting: Obstetrics and Gynecology

## 2025-01-22 ENCOUNTER — Ambulatory Visit: Admitting: Obstetrics and Gynecology

## 2025-02-12 ENCOUNTER — Ambulatory Visit: Admitting: Family Medicine

## 2025-04-02 ENCOUNTER — Ambulatory Visit

## 2025-10-01 ENCOUNTER — Ambulatory Visit: Admitting: Obstetrics and Gynecology
